# Patient Record
Sex: Female | Born: 1935
Health system: Southern US, Community
[De-identification: ages and names within clinical notes are randomized; demographics above are authoritative.]

## PROBLEM LIST (undated history)

## (undated) DIAGNOSIS — R262 Difficulty in walking, not elsewhere classified: Secondary | ICD-10-CM

## (undated) DIAGNOSIS — I639 Cerebral infarction, unspecified: Secondary | ICD-10-CM

## (undated) DIAGNOSIS — M6281 Muscle weakness (generalized): Secondary | ICD-10-CM

## (undated) DIAGNOSIS — I10 Essential (primary) hypertension: Secondary | ICD-10-CM

## (undated) DIAGNOSIS — F0390 Unspecified dementia without behavioral disturbance: Secondary | ICD-10-CM

## (undated) HISTORY — PX: LAPAROSCOPIC CHOLECYSTECTOMY: SUR755

## (undated) HISTORY — PX: CATARACT EXTRACTION, BILATERAL: SHX1313

---

## 1999-09-20 ENCOUNTER — Ambulatory Visit (HOSPITAL_COMMUNITY): Admission: RE | Admit: 1999-09-20 | Discharge: 1999-09-20 | Payer: Self-pay | Admitting: Specialist

## 2015-10-26 DIAGNOSIS — Z23 Encounter for immunization: Secondary | ICD-10-CM | POA: Diagnosis not present

## 2016-08-15 DIAGNOSIS — S20212A Contusion of left front wall of thorax, initial encounter: Secondary | ICD-10-CM | POA: Diagnosis not present

## 2016-08-15 DIAGNOSIS — R079 Chest pain, unspecified: Secondary | ICD-10-CM | POA: Diagnosis not present

## 2016-08-15 DIAGNOSIS — S79911A Unspecified injury of right hip, initial encounter: Secondary | ICD-10-CM | POA: Diagnosis not present

## 2016-08-15 DIAGNOSIS — G8911 Acute pain due to trauma: Secondary | ICD-10-CM | POA: Diagnosis not present

## 2016-08-15 DIAGNOSIS — S20219A Contusion of unspecified front wall of thorax, initial encounter: Secondary | ICD-10-CM | POA: Diagnosis not present

## 2016-08-15 DIAGNOSIS — S299XXA Unspecified injury of thorax, initial encounter: Secondary | ICD-10-CM | POA: Diagnosis not present

## 2016-08-21 DIAGNOSIS — Z6826 Body mass index (BMI) 26.0-26.9, adult: Secondary | ICD-10-CM | POA: Diagnosis not present

## 2016-08-21 DIAGNOSIS — I10 Essential (primary) hypertension: Secondary | ICD-10-CM | POA: Diagnosis not present

## 2016-08-21 DIAGNOSIS — S20212A Contusion of left front wall of thorax, initial encounter: Secondary | ICD-10-CM | POA: Diagnosis not present

## 2017-08-05 DIAGNOSIS — S199XXA Unspecified injury of neck, initial encounter: Secondary | ICD-10-CM | POA: Diagnosis not present

## 2017-08-05 DIAGNOSIS — R609 Edema, unspecified: Secondary | ICD-10-CM | POA: Diagnosis not present

## 2017-08-05 DIAGNOSIS — R51 Headache: Secondary | ICD-10-CM | POA: Diagnosis not present

## 2017-08-05 DIAGNOSIS — T07XXXA Unspecified multiple injuries, initial encounter: Secondary | ICD-10-CM | POA: Diagnosis not present

## 2017-08-05 DIAGNOSIS — S0990XA Unspecified injury of head, initial encounter: Secondary | ICD-10-CM | POA: Diagnosis not present

## 2017-08-05 DIAGNOSIS — S52591A Other fractures of lower end of right radius, initial encounter for closed fracture: Secondary | ICD-10-CM | POA: Diagnosis not present

## 2017-08-05 DIAGNOSIS — S52124A Nondisplaced fracture of head of right radius, initial encounter for closed fracture: Secondary | ICD-10-CM | POA: Diagnosis not present

## 2017-08-05 DIAGNOSIS — R220 Localized swelling, mass and lump, head: Secondary | ICD-10-CM | POA: Diagnosis not present

## 2017-08-05 DIAGNOSIS — S0001XA Abrasion of scalp, initial encounter: Secondary | ICD-10-CM | POA: Diagnosis not present

## 2017-08-05 DIAGNOSIS — S62101A Fracture of unspecified carpal bone, right wrist, initial encounter for closed fracture: Secondary | ICD-10-CM | POA: Diagnosis not present

## 2017-08-05 DIAGNOSIS — S0081XA Abrasion of other part of head, initial encounter: Secondary | ICD-10-CM | POA: Diagnosis not present

## 2017-12-07 DIAGNOSIS — S0990XA Unspecified injury of head, initial encounter: Secondary | ICD-10-CM | POA: Diagnosis not present

## 2017-12-07 DIAGNOSIS — F0390 Unspecified dementia without behavioral disturbance: Secondary | ICD-10-CM | POA: Diagnosis not present

## 2017-12-07 DIAGNOSIS — R404 Transient alteration of awareness: Secondary | ICD-10-CM | POA: Diagnosis not present

## 2017-12-07 DIAGNOSIS — I10 Essential (primary) hypertension: Secondary | ICD-10-CM | POA: Diagnosis not present

## 2017-12-07 DIAGNOSIS — S52591D Other fractures of lower end of right radius, subsequent encounter for closed fracture with routine healing: Secondary | ICD-10-CM | POA: Diagnosis not present

## 2017-12-07 DIAGNOSIS — R410 Disorientation, unspecified: Secondary | ICD-10-CM | POA: Diagnosis not present

## 2017-12-07 DIAGNOSIS — I499 Cardiac arrhythmia, unspecified: Secondary | ICD-10-CM | POA: Diagnosis not present

## 2017-12-07 DIAGNOSIS — R079 Chest pain, unspecified: Secondary | ICD-10-CM | POA: Diagnosis not present

## 2017-12-07 DIAGNOSIS — R4182 Altered mental status, unspecified: Secondary | ICD-10-CM | POA: Diagnosis not present

## 2017-12-11 DIAGNOSIS — R262 Difficulty in walking, not elsewhere classified: Secondary | ICD-10-CM | POA: Diagnosis not present

## 2017-12-11 DIAGNOSIS — R634 Abnormal weight loss: Secondary | ICD-10-CM | POA: Diagnosis not present

## 2017-12-11 DIAGNOSIS — F0390 Unspecified dementia without behavioral disturbance: Secondary | ICD-10-CM | POA: Diagnosis not present

## 2017-12-11 DIAGNOSIS — I10 Essential (primary) hypertension: Secondary | ICD-10-CM | POA: Diagnosis not present

## 2017-12-11 DIAGNOSIS — Z1331 Encounter for screening for depression: Secondary | ICD-10-CM | POA: Diagnosis not present

## 2017-12-11 DIAGNOSIS — Z9181 History of falling: Secondary | ICD-10-CM | POA: Diagnosis not present

## 2017-12-31 DIAGNOSIS — I10 Essential (primary) hypertension: Secondary | ICD-10-CM | POA: Diagnosis not present

## 2017-12-31 DIAGNOSIS — F0390 Unspecified dementia without behavioral disturbance: Secondary | ICD-10-CM | POA: Diagnosis not present

## 2017-12-31 DIAGNOSIS — Z9181 History of falling: Secondary | ICD-10-CM | POA: Diagnosis not present

## 2017-12-31 DIAGNOSIS — Z7982 Long term (current) use of aspirin: Secondary | ICD-10-CM | POA: Diagnosis not present

## 2018-01-01 DIAGNOSIS — Z7982 Long term (current) use of aspirin: Secondary | ICD-10-CM | POA: Diagnosis not present

## 2018-01-01 DIAGNOSIS — Z9181 History of falling: Secondary | ICD-10-CM | POA: Diagnosis not present

## 2018-01-01 DIAGNOSIS — I10 Essential (primary) hypertension: Secondary | ICD-10-CM | POA: Diagnosis not present

## 2018-01-01 DIAGNOSIS — F0390 Unspecified dementia without behavioral disturbance: Secondary | ICD-10-CM | POA: Diagnosis not present

## 2018-01-05 DIAGNOSIS — Z7982 Long term (current) use of aspirin: Secondary | ICD-10-CM | POA: Diagnosis not present

## 2018-01-05 DIAGNOSIS — I10 Essential (primary) hypertension: Secondary | ICD-10-CM | POA: Diagnosis not present

## 2018-01-05 DIAGNOSIS — F0390 Unspecified dementia without behavioral disturbance: Secondary | ICD-10-CM | POA: Diagnosis not present

## 2018-01-05 DIAGNOSIS — Z9181 History of falling: Secondary | ICD-10-CM | POA: Diagnosis not present

## 2018-01-22 DIAGNOSIS — I10 Essential (primary) hypertension: Secondary | ICD-10-CM | POA: Diagnosis not present

## 2018-01-22 DIAGNOSIS — R262 Difficulty in walking, not elsewhere classified: Secondary | ICD-10-CM | POA: Diagnosis not present

## 2018-01-22 DIAGNOSIS — F0390 Unspecified dementia without behavioral disturbance: Secondary | ICD-10-CM | POA: Diagnosis not present

## 2018-01-22 DIAGNOSIS — E663 Overweight: Secondary | ICD-10-CM | POA: Diagnosis not present

## 2018-01-22 DIAGNOSIS — R634 Abnormal weight loss: Secondary | ICD-10-CM | POA: Diagnosis not present

## 2018-02-03 DIAGNOSIS — I10 Essential (primary) hypertension: Secondary | ICD-10-CM | POA: Diagnosis not present

## 2018-02-03 DIAGNOSIS — F0280 Dementia in other diseases classified elsewhere without behavioral disturbance: Secondary | ICD-10-CM | POA: Diagnosis not present

## 2018-02-03 DIAGNOSIS — G309 Alzheimer's disease, unspecified: Secondary | ICD-10-CM | POA: Diagnosis not present

## 2018-02-03 DIAGNOSIS — R634 Abnormal weight loss: Secondary | ICD-10-CM | POA: Diagnosis not present

## 2018-02-08 ENCOUNTER — Emergency Department (HOSPITAL_COMMUNITY): Payer: Medicare Other

## 2018-02-08 ENCOUNTER — Inpatient Hospital Stay (HOSPITAL_COMMUNITY)
Admission: EM | Admit: 2018-02-08 | Discharge: 2018-02-16 | DRG: 116 | Disposition: A | Discharge status: Skilled Nursing Facility | Payer: Medicare Other | Attending: General Surgery | Admitting: General Surgery

## 2018-02-08 ENCOUNTER — Encounter (HOSPITAL_COMMUNITY): Payer: Self-pay | Admitting: Emergency Medicine

## 2018-02-08 ENCOUNTER — Inpatient Hospital Stay (HOSPITAL_COMMUNITY): Payer: Medicare Other

## 2018-02-08 DIAGNOSIS — S0592XA Unspecified injury of left eye and orbit, initial encounter: Secondary | ICD-10-CM | POA: Diagnosis not present

## 2018-02-08 DIAGNOSIS — R451 Restlessness and agitation: Secondary | ICD-10-CM | POA: Diagnosis present

## 2018-02-08 DIAGNOSIS — R609 Edema, unspecified: Secondary | ICD-10-CM

## 2018-02-08 DIAGNOSIS — S066X0A Traumatic subarachnoid hemorrhage without loss of consciousness, initial encounter: Secondary | ICD-10-CM | POA: Diagnosis present

## 2018-02-08 DIAGNOSIS — S069X9A Unspecified intracranial injury with loss of consciousness of unspecified duration, initial encounter: Secondary | ICD-10-CM | POA: Diagnosis present

## 2018-02-08 DIAGNOSIS — R269 Unspecified abnormalities of gait and mobility: Secondary | ICD-10-CM | POA: Diagnosis not present

## 2018-02-08 DIAGNOSIS — R0902 Hypoxemia: Secondary | ICD-10-CM | POA: Diagnosis not present

## 2018-02-08 DIAGNOSIS — S069XAA Unspecified intracranial injury with loss of consciousness status unknown, initial encounter: Secondary | ICD-10-CM | POA: Diagnosis present

## 2018-02-08 DIAGNOSIS — R279 Unspecified lack of coordination: Secondary | ICD-10-CM | POA: Diagnosis not present

## 2018-02-08 DIAGNOSIS — H5712 Ocular pain, left eye: Secondary | ICD-10-CM | POA: Diagnosis not present

## 2018-02-08 DIAGNOSIS — R600 Localized edema: Secondary | ICD-10-CM | POA: Diagnosis present

## 2018-02-08 DIAGNOSIS — M6281 Muscle weakness (generalized): Secondary | ICD-10-CM | POA: Diagnosis not present

## 2018-02-08 DIAGNOSIS — S0532XA Ocular laceration without prolapse or loss of intraocular tissue, left eye, initial encounter: Secondary | ICD-10-CM | POA: Diagnosis present

## 2018-02-08 DIAGNOSIS — S06899A Other specified intracranial injury with loss of consciousness of unspecified duration, initial encounter: Secondary | ICD-10-CM | POA: Diagnosis not present

## 2018-02-08 DIAGNOSIS — F0390 Unspecified dementia without behavioral disturbance: Secondary | ICD-10-CM

## 2018-02-08 DIAGNOSIS — H6121 Impacted cerumen, right ear: Secondary | ICD-10-CM | POA: Diagnosis present

## 2018-02-08 DIAGNOSIS — Z9181 History of falling: Secondary | ICD-10-CM

## 2018-02-08 DIAGNOSIS — W19XXXA Unspecified fall, initial encounter: Secondary | ICD-10-CM | POA: Diagnosis not present

## 2018-02-08 DIAGNOSIS — S60031A Contusion of right middle finger without damage to nail, initial encounter: Secondary | ICD-10-CM | POA: Diagnosis present

## 2018-02-08 DIAGNOSIS — H4312 Vitreous hemorrhage, left eye: Secondary | ICD-10-CM | POA: Diagnosis present

## 2018-02-08 DIAGNOSIS — H2102 Hyphema, left eye: Secondary | ICD-10-CM | POA: Diagnosis present

## 2018-02-08 DIAGNOSIS — I10 Essential (primary) hypertension: Secondary | ICD-10-CM | POA: Diagnosis present

## 2018-02-08 DIAGNOSIS — M79644 Pain in right finger(s): Secondary | ICD-10-CM | POA: Diagnosis not present

## 2018-02-08 DIAGNOSIS — M2550 Pain in unspecified joint: Secondary | ICD-10-CM | POA: Diagnosis not present

## 2018-02-08 DIAGNOSIS — R531 Weakness: Secondary | ICD-10-CM | POA: Diagnosis not present

## 2018-02-08 DIAGNOSIS — R41841 Cognitive communication deficit: Secondary | ICD-10-CM | POA: Diagnosis not present

## 2018-02-08 DIAGNOSIS — M7989 Other specified soft tissue disorders: Secondary | ICD-10-CM | POA: Diagnosis not present

## 2018-02-08 DIAGNOSIS — S0522XD Ocular laceration and rupture with prolapse or loss of intraocular tissue, left eye, subsequent encounter: Secondary | ICD-10-CM | POA: Diagnosis not present

## 2018-02-08 DIAGNOSIS — H547 Unspecified visual loss: Secondary | ICD-10-CM | POA: Diagnosis present

## 2018-02-08 DIAGNOSIS — Y92099 Unspecified place in other non-institutional residence as the place of occurrence of the external cause: Secondary | ICD-10-CM | POA: Diagnosis not present

## 2018-02-08 DIAGNOSIS — Z87820 Personal history of traumatic brain injury: Secondary | ICD-10-CM | POA: Diagnosis not present

## 2018-02-08 DIAGNOSIS — S0512XA Contusion of eyeball and orbital tissues, left eye, initial encounter: Secondary | ICD-10-CM | POA: Diagnosis not present

## 2018-02-08 DIAGNOSIS — D649 Anemia, unspecified: Secondary | ICD-10-CM

## 2018-02-08 DIAGNOSIS — W19XXXD Unspecified fall, subsequent encounter: Secondary | ICD-10-CM | POA: Diagnosis not present

## 2018-02-08 DIAGNOSIS — S0510XA Contusion of eyeball and orbital tissues, unspecified eye, initial encounter: Secondary | ICD-10-CM | POA: Diagnosis not present

## 2018-02-08 DIAGNOSIS — Z7401 Bed confinement status: Secondary | ICD-10-CM | POA: Diagnosis not present

## 2018-02-08 DIAGNOSIS — S199XXA Unspecified injury of neck, initial encounter: Secondary | ICD-10-CM | POA: Diagnosis not present

## 2018-02-08 DIAGNOSIS — S06330A Contusion and laceration of cerebrum, unspecified, without loss of consciousness, initial encounter: Secondary | ICD-10-CM

## 2018-02-08 DIAGNOSIS — S066X0D Traumatic subarachnoid hemorrhage without loss of consciousness, subsequent encounter: Secondary | ICD-10-CM | POA: Diagnosis not present

## 2018-02-08 DIAGNOSIS — S0081XA Abrasion of other part of head, initial encounter: Secondary | ICD-10-CM | POA: Diagnosis present

## 2018-02-08 DIAGNOSIS — M19041 Primary osteoarthritis, right hand: Secondary | ICD-10-CM | POA: Diagnosis not present

## 2018-02-08 DIAGNOSIS — H44822 Luxation of globe, left eye: Secondary | ICD-10-CM | POA: Diagnosis not present

## 2018-02-08 DIAGNOSIS — S0522XA Ocular laceration and rupture with prolapse or loss of intraocular tissue, left eye, initial encounter: Secondary | ICD-10-CM | POA: Diagnosis not present

## 2018-02-08 DIAGNOSIS — R410 Disorientation, unspecified: Secondary | ICD-10-CM | POA: Diagnosis not present

## 2018-02-08 DIAGNOSIS — R2681 Unsteadiness on feet: Secondary | ICD-10-CM | POA: Diagnosis not present

## 2018-02-08 DIAGNOSIS — S0993XD Unspecified injury of face, subsequent encounter: Secondary | ICD-10-CM | POA: Diagnosis not present

## 2018-02-08 HISTORY — DX: Unspecified dementia, unspecified severity, without behavioral disturbance, psychotic disturbance, mood disturbance, and anxiety: F03.90

## 2018-02-08 HISTORY — DX: Difficulty in walking, not elsewhere classified: R26.2

## 2018-02-08 HISTORY — DX: Essential (primary) hypertension: I10

## 2018-02-08 LAB — CBC WITH DIFFERENTIAL/PLATELET
Abs Immature Granulocytes: 0.03 10*3/uL (ref 0.00–0.07)
Basophils Absolute: 0 10*3/uL (ref 0.0–0.1)
Basophils Relative: 0 %
EOS ABS: 0.2 10*3/uL (ref 0.0–0.5)
Eosinophils Relative: 2 %
HCT: 32 % — ABNORMAL LOW (ref 36.0–46.0)
Hemoglobin: 9.8 g/dL — ABNORMAL LOW (ref 12.0–15.0)
Immature Granulocytes: 0 %
Lymphocytes Relative: 22 %
Lymphs Abs: 1.6 10*3/uL (ref 0.7–4.0)
MCH: 28.4 pg (ref 26.0–34.0)
MCHC: 30.6 g/dL (ref 30.0–36.0)
MCV: 92.8 fL (ref 80.0–100.0)
Monocytes Absolute: 0.5 10*3/uL (ref 0.1–1.0)
Monocytes Relative: 7 %
Neutro Abs: 4.7 10*3/uL (ref 1.7–7.7)
Neutrophils Relative %: 69 %
Platelets: 350 10*3/uL (ref 150–400)
RBC: 3.45 MIL/uL — ABNORMAL LOW (ref 3.87–5.11)
RDW: 13.1 % (ref 11.5–15.5)
WBC: 7 10*3/uL (ref 4.0–10.5)
nRBC: 0 % (ref 0.0–0.2)

## 2018-02-08 LAB — BASIC METABOLIC PANEL
Anion gap: 9 (ref 5–15)
BUN: 18 mg/dL (ref 8–23)
CALCIUM: 9.2 mg/dL (ref 8.9–10.3)
CO2: 25 mmol/L (ref 22–32)
Chloride: 104 mmol/L (ref 98–111)
Creatinine, Ser: 0.91 mg/dL (ref 0.44–1.00)
GFR calc Af Amer: >60 mL/min (ref >60)
GFR calc non Af Amer: 56 mL/min — ABNORMAL LOW (ref >60)
Glucose, Bld: 111 mg/dL — ABNORMAL HIGH (ref 70–99)
Potassium: 3.5 mmol/L (ref 3.5–5.1)
Sodium: 138 mmol/L (ref 135–145)

## 2018-02-08 LAB — MRSA PCR SCREENING: MRSA by PCR: NEGATIVE

## 2018-02-08 LAB — CK: Total CK: 307 U/L — ABNORMAL HIGH (ref 38–234)

## 2018-02-08 MED ORDER — TRAMADOL HCL 50 MG PO TABS: 50.0000 mg | ORAL_TABLET | Freq: Four times a day (QID) | ORAL | Status: DC | PRN

## 2018-02-08 MED ORDER — SODIUM CHLORIDE 0.9% FLUSH: 10.0000 mL | INTRAVENOUS | Status: DC | PRN

## 2018-02-08 MED ORDER — HYDRALAZINE HCL 20 MG/ML IJ SOLN: 10.0000 mg | INTRAMUSCULAR | Status: DC | PRN

## 2018-02-08 MED ORDER — DOCUSATE SODIUM 100 MG PO CAPS: 100.0000 mg | ORAL_CAPSULE | Freq: Two times a day (BID) | ORAL | Status: DC

## 2018-02-08 MED ORDER — LIDOCAINE HCL (PF) 1 % IJ SOLN
5.0000 mL | Freq: Once | INTRAMUSCULAR | Status: DC
  Filled 2018-02-08: qty 5

## 2018-02-08 MED ORDER — ERYTHROMYCIN 5 MG/GM OP OINT
TOPICAL_OINTMENT | Freq: Three times a day (TID) | OPHTHALMIC | Status: DC
  Administered 2018-02-08 – 2018-02-09 (×4): 1 via OPHTHALMIC
  Filled 2018-02-08: qty 3.5

## 2018-02-08 MED ORDER — ONDANSETRON 4 MG PO TBDP: 4.0000 mg | ORAL_TABLET | Freq: Four times a day (QID) | ORAL | Status: DC | PRN

## 2018-02-08 MED ORDER — MORPHINE SULFATE (PF) 2 MG/ML IV SOLN: 1.0000 mg | INTRAVENOUS | Status: DC | PRN

## 2018-02-08 MED ORDER — ACETAMINOPHEN 325 MG PO TABS: 650.0000 mg | ORAL_TABLET | ORAL | Status: DC | PRN

## 2018-02-08 MED ORDER — SODIUM CHLORIDE 0.9% FLUSH
10.0000 mL | Freq: Two times a day (BID) | INTRAVENOUS | Status: DC
  Administered 2018-02-08 – 2018-02-09 (×2): 10 mL

## 2018-02-08 MED ORDER — TETRACAINE HCL 0.5 % OP SOLN
1.0000 [drp] | Freq: Once | OPHTHALMIC | Status: AC
  Administered 2018-02-08: 1 [drp] via OPHTHALMIC
  Filled 2018-02-08: qty 4

## 2018-02-08 MED ORDER — KCL IN DEXTROSE-NACL 20-5-0.45 MEQ/L-%-% IV SOLN
INTRAVENOUS | Status: DC
  Administered 2018-02-08 – 2018-02-09 (×3): via INTRAVENOUS
  Filled 2018-02-08 (×2): qty 1000

## 2018-02-08 MED ORDER — ONDANSETRON HCL 4 MG/2ML IJ SOLN: 4.0000 mg | Freq: Four times a day (QID) | INTRAMUSCULAR | Status: DC | PRN

## 2018-02-08 NOTE — ED Notes (Signed)
Pt to CT via stretcher

## 2018-02-08 NOTE — H&P (Addendum)
Trauma Service Admission Note  [facility] Jun 27, 1926  [id number].    Chief Complaint/Reason for Consult: Fall  HPI:  85yo female with history of dementia who recently relocated from home to [facility] on 01/26/2018. Of note, patient's husband recently passed away this month. Patient was found on the floor by staff this morning and EMS was called and transported to [facility]. Patient noted to have facial trauma. Maxillofacial CT showing left orbital trauma with left globe rupture. CT head showed small hemorrhagic contusions in the left frontal lobe and scattered small volume subarachnoid hemorrhage. CT showed no evidence of acute maxillofacial or cervical spine fractures. Patient remains hemodynamically stable in ED resuscitation. Trauma service called for admission.   ROS:  Review of Systems  Unable to perform ROS: Dementia  Eyes: Positive for pain (left eye).  Cardiovascular: Positive for leg swelling (bilateral, new).  Psychiatric/Behavioral: Positive for memory loss.  Patient's neighbor at bedside provided some ROS information.   History reviewed. No pertinent family history.  Past Medical History:  Diagnosis Date  . Ambulatory dysfunction   . Hypertension   . Senile dementia [facility])     Past Surgical History:  Procedure Laterality Date  . CATARACT EXTRACTION, BILATERAL    . LAPAROSCOPIC CHOLECYSTECTOMY      Social History:  reports that she has never smoked. She has never used smokeless tobacco. She reports previous alcohol use. She reports that she does not use drugs.  Allergies: No Known Allergies  (Not in a hospital admission)   Blood pressure (!) 156/77, pulse 95, temperature 99 F (37.2 C), temperature source Tympanic, resp. rate 18, SpO2 100 %.  Physical Exam Constitutional:      General: She is not in acute distress.    Comments: Pleasantly confused/demented  HENT:     Head:     Comments: Abrasions to forehead and left face. Swelling to left  face.    Right Ear: There is impacted cerumen.     Ears:     Comments: Clear no hemotympanum.    Nose: Nose normal.     Comments: No CSF leak    Mouth/Throat:     Mouth: Mucous membranes are dry.     Pharynx: Oropharynx is clear.  Eyes:     Comments: Right eye pupil is reactive to light. Left eye pupil difficult to visualize d/t inability to elevate eyelid secondary to edema/pain. Muscle protruding from inferior left eyelid.   Neck:     Musculoskeletal: Neck supple. No muscular tenderness.     Comments: Will move neck side to side with no pain, but doesn't understand command to flex extend neck Cardiovascular:     Rate and Rhythm: Normal rate.     Pulses: Normal pulses.     Heart sounds: Normal heart sounds. No murmur. No gallop.   Pulmonary:     Effort: Pulmonary effort is normal. No respiratory distress.     Breath sounds: Normal breath sounds. No wheezing, rhonchi or rales.  Chest:     Chest wall: No tenderness.  Abdominal:     General: Bowel sounds are normal. There is no distension.     Palpations: Abdomen is soft.     Tenderness: There is no abdominal tenderness.  Musculoskeletal:        General: Swelling (bilateral LE, R>L) and signs of injury (swelling/bruising to right 3rd finger) present.     Comments: MAE purposefully and to commands.   Skin:    General: Skin is warm and  dry.  Neurological:     General: No focal deficit present.     Mental Status: She is alert. Mental status is at baseline. She is disoriented.     Sensory: No sensory deficit.     Comments: Neurovascularly intact.  Good strength with flexion and extension of her feet.  Normal grip strength with her hands.  Psychiatric:        Mood and Affect: Mood normal.     Comments: Oriented to self. Stated she was in [place].      Results for orders placed or performed during the hospital encounter of 02/08/18 (from the past 48 hour(s))  CK     Status: Abnormal   Collection Time: 02/08/18  6:25 AM   Result Value Ref Range   Total CK 307 (H) 38 - 234 U/L    Comment: Performed at [facility], [address] [place], [place] [address]  Basic metabolic panel     Status: Abnormal   Collection Time: 02/08/18  6:25 AM  Result Value Ref Range   Sodium 138 135 - 145 mmol/L   Potassium 3.5 3.5 - 5.1 mmol/L   Chloride 104 98 - 111 mmol/L   CO2 25 22 - 32 mmol/L   Glucose, Bld 111 (H) 70 - 99 mg/dL   BUN 18 8 - 23 mg/dL   Creatinine, Ser 9.14 0.44 - 1.00 mg/dL   Calcium 9.2 8.9 - 78.2 mg/dL   GFR calc non Af Amer 56 (L) >60 mL/min   GFR calc Af Amer >60 >60 mL/min   Anion gap 9 5 - 15    Comment: Performed at [facility], [address] [place], [place] [address]  CBC with Differential     Status: Abnormal   Collection Time: 02/08/18  6:25 AM  Result Value Ref Range   WBC 7.0 4.0 - 10.5 K/uL   RBC 3.45 (L) 3.87 - 5.11 MIL/uL   Hemoglobin 9.8 (L) 12.0 - 15.0 g/dL   HCT 30.8 (L) 65.7 - 84.6 %   MCV 92.8 80.0 - 100.0 fL   MCH 28.4 26.0 - 34.0 pg   MCHC 30.6 30.0 - 36.0 g/dL   RDW 96.2 95.2 - 84.1 %   Platelets 350 150 - 400 K/uL   nRBC 0.0 0.0 - 0.2 %   Neutrophils Relative % 69 %   Neutro Abs 4.7 1.7 - 7.7 K/uL   Lymphocytes Relative 22 %   Lymphs Abs 1.6 0.7 - 4.0 K/uL   Monocytes Relative 7 %   Monocytes Absolute 0.5 0.1 - 1.0 K/uL   Eosinophils Relative 2 %   Eosinophils Absolute 0.2 0.0 - 0.5 K/uL   Basophils Relative 0 %   Basophils Absolute 0.0 0.0 - 0.1 K/uL   Immature Granulocytes 0 %   Abs Immature Granulocytes 0.03 0.00 - 0.07 K/uL    Comment: Performed at [facility], [address] [place], [place] [address]   Ct Head Wo Contrast  Result Date: 02/08/2018 CLINICAL DATA:  Found on floor. Facial trauma. Initial encounter. EXAM: CT HEAD WITHOUT CONTRAST CT MAXILLOFACIAL WITHOUT CONTRAST CT CERVICAL SPINE WITHOUT CONTRAST TECHNIQUE: Multidetector CT imaging of the head, cervical spine, and maxillofacial structures were performed using the  standard protocol without intravenous contrast. Multiplanar CT image reconstructions of the cervical spine and maxillofacial structures were also generated. COMPARISON:  Head CT 12/07/2017. Cervical spine CT 08/05/2017. Maxillofacial CT 02/16/2013. FINDINGS: CT HEAD FINDINGS Brain: A few hemorrhagic contusions  are present anteriorly in the left frontal lobe measuring up to 11 mm. Scattered small volume acute subarachnoid hemorrhage is most notable over the left parietal convexity. No acute infarct, midline shift, or extra-axial fluid collection is identified. Cerebral atrophy is most notable in the parietal and temporal lobes. Cerebral white matter hypodensities are similar to the prior study and nonspecific but compatible with mild chronic small vessel ischemic disease. A chronic lacunar infarct is again noted in the right thalamus. Vascular: Calcified atherosclerosis at the skull base. No hyperdense vessel. Skull: No fracture or suspicious osseous lesion. Other: Frontal scalp and left periorbital swelling. CT MAXILLOFACIAL FINDINGS Osseous: No acute fracture or mandibular dislocation. Orbits: Left orbital vitreous hemorrhage with moderate volume hemorrhage surrounding the left globe including posteriorly in the intraconal space. Poorly defined margins of the left globe with abnormal globe contour and mild globe volume loss. Bilateral cataract extraction. Sinuses: Mild scattered mucosal thickening in the paranasal sinuses. No fluid. Clear mastoid air cells. Soft tissues: Moderate left periorbital hematoma with swelling extending inferiorly and laterally in the left face. CT CERVICAL SPINE FINDINGS Alignment: Unchanged and near anatomic alignment. No evidence of traumatic subluxation. Skull base and vertebrae: No acute fracture or suspicious osseous lesion. Soft tissues and spinal canal: No prevertebral fluid or swelling. No visible canal hematoma. Disc levels: Unchanged advanced disc space narrowing at C5-6 and  C6-7 and moderate narrowing at C3-4. Mild-to-moderate multilevel facet arthrosis. Upper chest: Clear lung apices. Other: Mild calcified atherosclerosis of the carotid arteries. IMPRESSION: 1. Small hemorrhagic contusions in the left frontal lobe and scattered small volume subarachnoid hemorrhage. 2. Left orbital trauma as above including evidence of left globe rupture. 3. No evidence of acute maxillofacial or cervical spine fracture. Critical Value/emergent results were called by telephone at the time of interpretation on 02/08/2018 at 7:42 am to Dr. Arthor CaptainABIGAIL HARRIS , who verbally acknowledged these results. Electronically Signed   By: Sebastian AcheAllen  Grady M.D.   On: 02/08/2018 07:44   Ct Cervical Spine Wo Contrast  Result Date: 02/08/2018 CLINICAL DATA:  Found on floor. Facial trauma. Initial encounter. EXAM: CT HEAD WITHOUT CONTRAST CT MAXILLOFACIAL WITHOUT CONTRAST CT CERVICAL SPINE WITHOUT CONTRAST TECHNIQUE: Multidetector CT imaging of the head, cervical spine, and maxillofacial structures were performed using the standard protocol without intravenous contrast. Multiplanar CT image reconstructions of the cervical spine and maxillofacial structures were also generated. COMPARISON:  Head CT 12/07/2017. Cervical spine CT 08/05/2017. Maxillofacial CT 02/16/2013. FINDINGS: CT HEAD FINDINGS Brain: A few hemorrhagic contusions are present anteriorly in the left frontal lobe measuring up to 11 mm. Scattered small volume acute subarachnoid hemorrhage is most notable over the left parietal convexity. No acute infarct, midline shift, or extra-axial fluid collection is identified. Cerebral atrophy is most notable in the parietal and temporal lobes. Cerebral white matter hypodensities are similar to the prior study and nonspecific but compatible with mild chronic small vessel ischemic disease. A chronic lacunar infarct is again noted in the right thalamus. Vascular: Calcified atherosclerosis at the skull base. No hyperdense  vessel. Skull: No fracture or suspicious osseous lesion. Other: Frontal scalp and left periorbital swelling. CT MAXILLOFACIAL FINDINGS Osseous: No acute fracture or mandibular dislocation. Orbits: Left orbital vitreous hemorrhage with moderate volume hemorrhage surrounding the left globe including posteriorly in the intraconal space. Poorly defined margins of the left globe with abnormal globe contour and mild globe volume loss. Bilateral cataract extraction. Sinuses: Mild scattered mucosal thickening in the paranasal sinuses. No fluid. Clear mastoid air cells. Soft tissues: Moderate  left periorbital hematoma with swelling extending inferiorly and laterally in the left face. CT CERVICAL SPINE FINDINGS Alignment: Unchanged and near anatomic alignment. No evidence of traumatic subluxation. Skull base and vertebrae: No acute fracture or suspicious osseous lesion. Soft tissues and spinal canal: No prevertebral fluid or swelling. No visible canal hematoma. Disc levels: Unchanged advanced disc space narrowing at C5-6 and C6-7 and moderate narrowing at C3-4. Mild-to-moderate multilevel facet arthrosis. Upper chest: Clear lung apices. Other: Mild calcified atherosclerosis of the carotid arteries. IMPRESSION: 1. Small hemorrhagic contusions in the left frontal lobe and scattered small volume subarachnoid hemorrhage. 2. Left orbital trauma as above including evidence of left globe rupture. 3. No evidence of acute maxillofacial or cervical spine fracture. Critical Value/emergent results were called by telephone at the time of interpretation on 02/08/2018 at 7:42 am to Dr. Arthor Captain , who verbally acknowledged these results. Electronically Signed   By: Sebastian Ache M.D.   On: 02/08/2018 07:44   Dg Hand Complete Right  Result Date: 02/08/2018 CLINICAL DATA:  Third digit pain, no known injury, initial encounter EXAM: RIGHT HAND - COMPLETE 3+ VIEW COMPARISON:  12/07/2017 FINDINGS: Mild degenerative changes of the  interphalangeal joints are seen. Mild carpal degenerative changes are noted as well. No acute fracture or dislocation is seen. No gross soft tissue abnormality is noted. IMPRESSION: Mild degenerative change without acute abnormality. Electronically Signed   By: Alcide Clever M.D.   On: 02/08/2018 09:47   Ct Maxillofacial Wo Contrast  Result Date: 02/08/2018 CLINICAL DATA:  Found on floor. Facial trauma. Initial encounter. EXAM: CT HEAD WITHOUT CONTRAST CT MAXILLOFACIAL WITHOUT CONTRAST CT CERVICAL SPINE WITHOUT CONTRAST TECHNIQUE: Multidetector CT imaging of the head, cervical spine, and maxillofacial structures were performed using the standard protocol without intravenous contrast. Multiplanar CT image reconstructions of the cervical spine and maxillofacial structures were also generated. COMPARISON:  Head CT 12/07/2017. Cervical spine CT 08/05/2017. Maxillofacial CT 02/16/2013. FINDINGS: CT HEAD FINDINGS Brain: A few hemorrhagic contusions are present anteriorly in the left frontal lobe measuring up to 11 mm. Scattered small volume acute subarachnoid hemorrhage is most notable over the left parietal convexity. No acute infarct, midline shift, or extra-axial fluid collection is identified. Cerebral atrophy is most notable in the parietal and temporal lobes. Cerebral white matter hypodensities are similar to the prior study and nonspecific but compatible with mild chronic small vessel ischemic disease. A chronic lacunar infarct is again noted in the right thalamus. Vascular: Calcified atherosclerosis at the skull base. No hyperdense vessel. Skull: No fracture or suspicious osseous lesion. Other: Frontal scalp and left periorbital swelling. CT MAXILLOFACIAL FINDINGS Osseous: No acute fracture or mandibular dislocation. Orbits: Left orbital vitreous hemorrhage with moderate volume hemorrhage surrounding the left globe including posteriorly in the intraconal space. Poorly defined margins of the left globe with  abnormal globe contour and mild globe volume loss. Bilateral cataract extraction. Sinuses: Mild scattered mucosal thickening in the paranasal sinuses. No fluid. Clear mastoid air cells. Soft tissues: Moderate left periorbital hematoma with swelling extending inferiorly and laterally in the left face. CT CERVICAL SPINE FINDINGS Alignment: Unchanged and near anatomic alignment. No evidence of traumatic subluxation. Skull base and vertebrae: No acute fracture or suspicious osseous lesion. Soft tissues and spinal canal: No prevertebral fluid or swelling. No visible canal hematoma. Disc levels: Unchanged advanced disc space narrowing at C5-6 and C6-7 and moderate narrowing at C3-4. Mild-to-moderate multilevel facet arthrosis. Upper chest: Clear lung apices. Other: Mild calcified atherosclerosis of the carotid arteries. IMPRESSION: 1.  Small hemorrhagic contusions in the left frontal lobe and scattered small volume subarachnoid hemorrhage. 2. Left orbital trauma as above including evidence of left globe rupture. 3. No evidence of acute maxillofacial or cervical spine fracture. Critical Value/emergent results were called by telephone at the time of interpretation on 02/08/2018 at 7:42 am to Dr. Arthor CaptainABIGAIL HARRIS , who verbally acknowledged these results. Electronically Signed   By: Sebastian AcheAllen  Grady M.D.   On: 02/08/2018 07:44      Assessment/Plan Active Problems:   TBI (traumatic brain injury) (HCC)   Injury of globe of left eye   Contusion of right middle finger   Fall   Dementia (HCC)   Anemia   Bilateral lower extremity edema  Dementia: No medications PTA. Relocated to memory care unit on 01/26/2018.  History of falls: Neighbor reports h/o fall last year with right wrist injury. Hypertension: H/o htn on no medications PTA. SBP 150s. PRN antihypertensives ordered.   TBI: CT head on 02/08/2018 showed small hemorrhagic contusions in the left frontal lobe and scattered small volume subarachnoid hemorrhage, Patient  with h/o dementia at baseline. Alert and oriented to self, no focal neuro deficits. Neurosurgery consulted.  Left globe injury: Maxillofacial CT on 02/08/2018 showed left orbital trauma with left globe rupture. Left eye pain. Pain control regimen with PRN acetaminophen, tramadol and morphine ordered. Ophthalmology consulted.   Right middle finger injury: Edema/bruising to finger. X-ray pending.  Anemia: Hgb 9.8 and Hct 32.0 on arrival. Unknown baseline labs. Monitor with repeat CBC tomorrow.  Bilateral lower extremity edema: BLE swollen, R>L. Neighbor reports this is new since relocation. Bilateral venous duplex ordered to rule out DVT.   FEN: NPO until seen by consultants. IVF ordered.  VTE: SCD's; Hold chemical prophylaxis given TBI.  ID: None currently pending opthalmology consult.  Follow up: Neurosurgery, Opthalmology  Plan: Admit to ICU.    Sammuel CooperJessica Jarvis, NP-S 02/08/2018, 9:57 AM   Barnetta ChapelKelly Lukasz Rogus, [facility] Trauma Service Pager: [phone] Pager [phone]

## 2018-02-08 NOTE — Consult Note (Signed)
Reason for Consult: Cerebral contusion Referring Physician: Trauma surgeon  [facility] Natalie Spencer is an 85 y.o. female.  HPI: The patient is an 85 year old demented white female who by report was in the memory unit and took a fall approximately 0500 this morning.  Much of the details are provided by her neighbor who is the patient's power of attorney.  The patient was worked up with a head CT which demonstrated a small left cerebral contusion and subarachnoid hemorrhage as well as a left orbital injury.  A neurosurgical consultation was requested.  Patient is not on any anticoagulants according to her neighbor and power of attorney.  Presently the patient is alert and pleasant.  She is demented so getting assistant history is difficult.  She seems to be at her baseline mentally.  The best I can tell, she cannot see out of her left eye.  Denies neck pain, back pain, etc.  Past Medical History:  Diagnosis Date  . Ambulatory dysfunction   . Hypertension   . Senile dementia (HCC)     History reviewed. No pertinent surgical history.  No family history on file.  Social History:  reports that she has never smoked. She has never used smokeless tobacco. She reports previous alcohol use. She reports that she does not use drugs.  Allergies: No Known Allergies  Medications:  I have reviewed the patient's current medications. Prior to Admission: (Not in a hospital admission)  Scheduled: . docusate sodium  100 mg Oral BID  . lidocaine (PF)  5 mL Other Once   Continuous: . dextrose 5 % and 0.45 % NaCl with KCl 20 mEq/L     ONG:[id number], hydrALAZINE, morphine injection, ondansetron **OR** ondansetron (ZOFRAN) IV, traMADol Anti-infectives (From admission, onward)   None       Results for orders placed or performed during the hospital encounter of 02/08/18 (from the past 48 hour(s))  CK     Status: Abnormal   Collection Time: 02/08/18  6:25 AM  Result Value Ref Range   Total CK 307 (H) 38 - 234  U/L    Comment: Performed at [facility], [address] [place], [place] [address]  Basic metabolic panel     Status: Abnormal   Collection Time: 02/08/18  6:25 AM  Result Value Ref Range   Sodium 138 135 - 145 mmol/L   Potassium 3.5 3.5 - 5.1 mmol/L   Chloride 104 98 - 111 mmol/L   CO2 25 22 - 32 mmol/L   Glucose, Bld 111 (H) 70 - 99 mg/dL   BUN 18 8 - 23 mg/dL   Creatinine, Ser 3.66 0.44 - 1.00 mg/dL   Calcium 9.2 8.9 - 44.0 mg/dL   GFR calc non Af Amer 56 (L) >60 mL/min   GFR calc Af Amer >60 >60 mL/min   Anion gap 9 5 - 15    Comment: Performed at [facility], [address] [place], [place] [address]  CBC with Differential     Status: Abnormal   Collection Time: 02/08/18  6:25 AM  Result Value Ref Range   WBC 7.0 4.0 - 10.5 K/uL   RBC 3.45 (L) 3.87 - 5.11 MIL/uL   Hemoglobin 9.8 (L) 12.0 - 15.0 g/dL   HCT 59.5 (L) 63.8 - 75.6 %   MCV 92.8 80.0 - 100.0 fL   MCH 28.4 26.0 - 34.0 pg   MCHC 30.6 30.0 - 36.0 g/dL   RDW 43.3 29.5 - 18.8 %   Platelets  350 150 - 400 K/uL   nRBC 0.0 0.0 - 0.2 %   Neutrophils Relative % 69 %   Neutro Abs 4.7 1.7 - 7.7 K/uL   Lymphocytes Relative 22 %   Lymphs Abs 1.6 0.7 - 4.0 K/uL   Monocytes Relative 7 %   Monocytes Absolute 0.5 0.1 - 1.0 K/uL   Eosinophils Relative 2 %   Eosinophils Absolute 0.2 0.0 - 0.5 K/uL   Basophils Relative 0 %   Basophils Absolute 0.0 0.0 - 0.1 K/uL   Immature Granulocytes 0 %   Abs Immature Granulocytes 0.03 0.00 - 0.07 K/uL    Comment: Performed at [facility], [address] [place], [place] [id number]    Ct Head Wo Contrast  Result Date: 02/08/2018 CLINICAL DATA:  Found on floor. Facial trauma. Initial encounter. EXAM: CT HEAD WITHOUT CONTRAST CT MAXILLOFACIAL WITHOUT CONTRAST CT CERVICAL SPINE WITHOUT CONTRAST TECHNIQUE: Multidetector CT imaging of the head, cervical spine, and maxillofacial structures were performed using the standard protocol without intravenous contrast. Multiplanar  CT image reconstructions of the cervical spine and maxillofacial structures were also generated. COMPARISON:  Head CT 12/07/2017. Cervical spine CT 08/05/2017. Maxillofacial CT 02/16/2013. FINDINGS: CT HEAD FINDINGS Brain: A few hemorrhagic contusions are present anteriorly in the left frontal lobe measuring up to 11 mm. Scattered small volume acute subarachnoid hemorrhage is most notable over the left parietal convexity. No acute infarct, midline shift, or extra-axial fluid collection is identified. Cerebral atrophy is most notable in the parietal and temporal lobes. Cerebral white matter hypodensities are similar to the prior study and nonspecific but compatible with mild chronic small vessel ischemic disease. A chronic lacunar infarct is again noted in the right thalamus. Vascular: Calcified atherosclerosis at the skull base. No hyperdense vessel. Skull: No fracture or suspicious osseous lesion. Other: Frontal scalp and left periorbital swelling. CT MAXILLOFACIAL FINDINGS Osseous: No acute fracture or mandibular dislocation. Orbits: Left orbital vitreous hemorrhage with moderate volume hemorrhage surrounding the left globe including posteriorly in the intraconal space. Poorly defined margins of the left globe with abnormal globe contour and mild globe volume loss. Bilateral cataract extraction. Sinuses: Mild scattered mucosal thickening in the paranasal sinuses. No fluid. Clear mastoid air cells. Soft tissues: Moderate left periorbital hematoma with swelling extending inferiorly and laterally in the left face. CT CERVICAL SPINE FINDINGS Alignment: Unchanged and near anatomic alignment. No evidence of traumatic subluxation. Skull base and vertebrae: No acute fracture or suspicious osseous lesion. Soft tissues and spinal canal: No prevertebral fluid or swelling. No visible canal hematoma. Disc levels: Unchanged advanced disc space narrowing at C5-6 and C6-7 and moderate narrowing at C3-4. Mild-to-moderate  multilevel facet arthrosis. Upper chest: Clear lung apices. Other: Mild calcified atherosclerosis of the carotid arteries. IMPRESSION: 1. Small hemorrhagic contusions in the left frontal lobe and scattered small volume subarachnoid hemorrhage. 2. Left orbital trauma as above including evidence of left globe rupture. 3. No evidence of acute maxillofacial or cervical spine fracture. Critical Value/emergent results were called by telephone at the time of interpretation on 02/08/2018 at 7:42 am to Dr. Arthor CaptainABIGAIL HARRIS , who verbally acknowledged these results. Electronically Signed   By: Sebastian AcheAllen  Grady M.D.   On: 02/08/2018 07:44   Ct Cervical Spine Wo Contrast  Result Date: 02/08/2018 CLINICAL DATA:  Found on floor. Facial trauma. Initial encounter. EXAM: CT HEAD WITHOUT CONTRAST CT MAXILLOFACIAL WITHOUT CONTRAST CT CERVICAL SPINE WITHOUT CONTRAST TECHNIQUE: Multidetector CT imaging of the head, cervical spine, and maxillofacial structures were performed  using the standard protocol without intravenous contrast. Multiplanar CT image reconstructions of the cervical spine and maxillofacial structures were also generated. COMPARISON:  Head CT 12/07/2017. Cervical spine CT 08/05/2017. Maxillofacial CT 02/16/2013. FINDINGS: CT HEAD FINDINGS Brain: A few hemorrhagic contusions are present anteriorly in the left frontal lobe measuring up to 11 mm. Scattered small volume acute subarachnoid hemorrhage is most notable over the left parietal convexity. No acute infarct, midline shift, or extra-axial fluid collection is identified. Cerebral atrophy is most notable in the parietal and temporal lobes. Cerebral white matter hypodensities are similar to the prior study and nonspecific but compatible with mild chronic small vessel ischemic disease. A chronic lacunar infarct is again noted in the right thalamus. Vascular: Calcified atherosclerosis at the skull base. No hyperdense vessel. Skull: No fracture or suspicious osseous lesion.  Other: Frontal scalp and left periorbital swelling. CT MAXILLOFACIAL FINDINGS Osseous: No acute fracture or mandibular dislocation. Orbits: Left orbital vitreous hemorrhage with moderate volume hemorrhage surrounding the left globe including posteriorly in the intraconal space. Poorly defined margins of the left globe with abnormal globe contour and mild globe volume loss. Bilateral cataract extraction. Sinuses: Mild scattered mucosal thickening in the paranasal sinuses. No fluid. Clear mastoid air cells. Soft tissues: Moderate left periorbital hematoma with swelling extending inferiorly and laterally in the left face. CT CERVICAL SPINE FINDINGS Alignment: Unchanged and near anatomic alignment. No evidence of traumatic subluxation. Skull base and vertebrae: No acute fracture or suspicious osseous lesion. Soft tissues and spinal canal: No prevertebral fluid or swelling. No visible canal hematoma. Disc levels: Unchanged advanced disc space narrowing at C5-6 and C6-7 and moderate narrowing at C3-4. Mild-to-moderate multilevel facet arthrosis. Upper chest: Clear lung apices. Other: Mild calcified atherosclerosis of the carotid arteries. IMPRESSION: 1. Small hemorrhagic contusions in the left frontal lobe and scattered small volume subarachnoid hemorrhage. 2. Left orbital trauma as above including evidence of left globe rupture. 3. No evidence of acute maxillofacial or cervical spine fracture. Critical Value/emergent results were called by telephone at the time of interpretation on 02/08/2018 at 7:42 am to Dr. Arthor Captain , who verbally acknowledged these results. Electronically Signed   By: Sebastian Ache M.D.   On: 02/08/2018 07:44   Ct Maxillofacial Wo Contrast  Result Date: 02/08/2018 CLINICAL DATA:  Found on floor. Facial trauma. Initial encounter. EXAM: CT HEAD WITHOUT CONTRAST CT MAXILLOFACIAL WITHOUT CONTRAST CT CERVICAL SPINE WITHOUT CONTRAST TECHNIQUE: Multidetector CT imaging of the head, cervical spine,  and maxillofacial structures were performed using the standard protocol without intravenous contrast. Multiplanar CT image reconstructions of the cervical spine and maxillofacial structures were also generated. COMPARISON:  Head CT 12/07/2017. Cervical spine CT 08/05/2017. Maxillofacial CT 02/16/2013. FINDINGS: CT HEAD FINDINGS Brain: A few hemorrhagic contusions are present anteriorly in the left frontal lobe measuring up to 11 mm. Scattered small volume acute subarachnoid hemorrhage is most notable over the left parietal convexity. No acute infarct, midline shift, or extra-axial fluid collection is identified. Cerebral atrophy is most notable in the parietal and temporal lobes. Cerebral white matter hypodensities are similar to the prior study and nonspecific but compatible with mild chronic small vessel ischemic disease. A chronic lacunar infarct is again noted in the right thalamus. Vascular: Calcified atherosclerosis at the skull base. No hyperdense vessel. Skull: No fracture or suspicious osseous lesion. Other: Frontal scalp and left periorbital swelling. CT MAXILLOFACIAL FINDINGS Osseous: No acute fracture or mandibular dislocation. Orbits: Left orbital vitreous hemorrhage with moderate volume hemorrhage surrounding the left globe including posteriorly  in the intraconal space. Poorly defined margins of the left globe with abnormal globe contour and mild globe volume loss. Bilateral cataract extraction. Sinuses: Mild scattered mucosal thickening in the paranasal sinuses. No fluid. Clear mastoid air cells. Soft tissues: Moderate left periorbital hematoma with swelling extending inferiorly and laterally in the left face. CT CERVICAL SPINE FINDINGS Alignment: Unchanged and near anatomic alignment. No evidence of traumatic subluxation. Skull base and vertebrae: No acute fracture or suspicious osseous lesion. Soft tissues and spinal canal: No prevertebral fluid or swelling. No visible canal hematoma. Disc levels:  Unchanged advanced disc space narrowing at C5-6 and C6-7 and moderate narrowing at C3-4. Mild-to-moderate multilevel facet arthrosis. Upper chest: Clear lung apices. Other: Mild calcified atherosclerosis of the carotid arteries. IMPRESSION: 1. Small hemorrhagic contusions in the left frontal lobe and scattered small volume subarachnoid hemorrhage. 2. Left orbital trauma as above including evidence of left globe rupture. 3. No evidence of acute maxillofacial or cervical spine fracture. Critical Value/emergent results were called by telephone at the time of interpretation on 02/08/2018 at 7:42 am to Dr. Arthor Captain , who verbally acknowledged these results. Electronically Signed   By: Sebastian Ache M.D.   On: 02/08/2018 07:44    ROS: As above.  She complains of left orbital pain. Blood pressure (!) 156/77, pulse 95, temperature 99 F (37.2 C), temperature source Tympanic, resp. rate 18, SpO2 100 %. There is no height or weight on file to calculate BMI.  Physical Exam  General: An alert and pleasant 85 year old white female with left orbital swelling, scleral hemorrhage and edema, forehead and periorbital abrasions.  HEENT: The patient's left pupil is irregular and nonreactive.  Extraocular muscles are normal on the right.  She has a decreased extraocular range of motion of the left but is able to move her eye.  I could not see either tympanic membrane because of cerumen but I do not see any evidence of CSF otorrhea or blood.  Neck: Supple without masses or deformities.  She has a age-appropriate normal range of motion.  Thorax: Symmetric  Abdomen: Soft  Extremities: Unremarkable  Back exam: Unremarkable  Neurologic exam: The patient is alert and oriented x1, person.  Glascow coma scale [id number].  She will answer simple questions but sometimes inappropriately.  Evidently this is her baseline.  She is moving all 4 extremities well.  It is difficult to accurately assess her strength as she does not  follow my directions well.  Sensory function is intact to light touch sensation all tested dermatomes bilaterally.  Cranial nerves II through XII are examined as above.  She seems to see fairly well out of her right eye but the best I can tell cannot see at all at her left eye.  Pupils are as above.  Imaging studies: I have reviewed the patient's head CT performed at [facility] today.  She has a small left frontal cerebral contusion with a small amount of subarachnoid hemorrhage.  There is no significant mass-effect.  She has left orbital swelling.  I have also reviewed the patient's cervical CT performed at [facility] today.  She has mild degenerative changes.  I do not see any acute fractures or subluxations, etc. Assessment/Plan: Small cerebral contusion, small traumatic subarachnoid hemorrhage: I have discussed the situation with the patient's PA and her power of attorney.  I do not think it is likely that this contusion will enlarge to the point where she would need surgery particularly since it happened about 5  hours ago and she has not anticoagulated.  I will leave timing of her orbital surgery up to discretion of the anesthesiologist and ophthalmologist.  The patient is going to admitted by the trauma service and I will plan to repeat her CAT scan tomorrow.  Cristi LoronJeffrey D Lorrie Strauch 02/08/2018, 9:45 AM

## 2018-02-08 NOTE — ED Notes (Signed)
Purewick has been placed and hooked up to suction.

## 2018-02-08 NOTE — Evaluation (Signed)
Physical Therapy Evaluation Patient Details Name: Natalie Spencer MRN: [id number] DOB: [birth year] Today's Date: 02/08/2018   History of Present Illness  85 yo admitted after fall at memory care with left frontal intracerebral contusion and left globe rupture. PMhx: dementia, HTN  Clinical Impression  Pt pleasantly confused unaware of place, situation, time. Pt perseverating on desire for food and need to void and unable to recall answers to NPO and having toileted during session. Pt with decreased strength, transfers, and gait who requires 24hr supervision/assist and has no carryover of education or assist required. Pt will benefit from ST-SNF to maximize function and safety to decrease burden of care and fall risk. Son reports he desires to take her home and has 4 stairs to enter his home and can provide 24hr care but son is not currently POA. Will follow acutely with daily mobility with nursing recommended. Pt requires mod cues to not touch her swollen eye.      Follow Up Recommendations SNF;Supervision/Assistance - 24 hour    Equipment Recommendations  None recommended by PT    Recommendations for Other Services       Precautions / Restrictions Precautions Precautions: Fall Restrictions Weight Bearing Restrictions: No      Mobility  Bed Mobility Overal bed mobility: Needs Assistance Bed Mobility: Supine to Sit     Supine to sit: Min assist     General bed mobility comments: assist to bring legs off of bed and elevate trunk  Transfers Overall transfer level: Needs assistance   Transfers: Sit to/from Stand Sit to Stand: Min guard         General transfer comment: cues for hand placement and safety  Ambulation/Gait Ambulation/Gait assistance: Min assist Gait Distance (Feet): 100 Feet Assistive device: Rolling walker (2 wheeled) Gait Pattern/deviations: Step-through pattern;Decreased stride length;Trunk flexed   Gait velocity interpretation: 1.31 - 2.62 ft/sec,  indicative of limited community ambulator General Gait Details: cues for posture, position in RW and safety with assist to direct rW  Stairs            Wheelchair Mobility    Modified Rankin (Stroke Patients Only)       Balance Overall balance assessment: Needs assistance;History of Falls   Sitting balance-Leahy Scale: Good     Standing balance support: Bilateral upper extremity supported Standing balance-Leahy Scale: Poor                               Pertinent Vitals/Pain Pain Assessment: No/denies pain    Home Living Family/patient expects to be discharged to:: Assisted living               Home Equipment: Walker - 2 wheels;Walker - 4 wheels Additional Comments: pt has RW and rollator but refuses to use them per son    Prior Function Level of Independence: Independent;Needs assistance   Gait / Transfers Assistance Needed: independent for gait and transfers  ADL's / Homemaking Assistance Needed: assist for bathing and dressing        Hand Dominance        Extremity/Trunk Assessment   Upper Extremity Assessment Upper Extremity Assessment: Generalized weakness    Lower Extremity Assessment Lower Extremity Assessment: Generalized weakness    Cervical / Trunk Assessment Cervical / Trunk Assessment: Kyphotic  Communication   Communication: No difficulties  Cognition Arousal/Alertness: Awake/alert Behavior During Therapy: Flat affect Overall Cognitive Status: Impaired/Different from baseline Area of Impairment: Orientation;Attention;Memory;Following commands;Problem solving;Safety/judgement  Orientation Level: Place;Time;Situation Current Attention Level: Sustained Memory: Decreased short-term memory Following Commands: Follows one step commands consistently Safety/Judgement: Decreased awareness of deficits;Decreased awareness of safety   Problem Solving: Slow processing General Comments: pt perseverating  on food and need to void despite having voided during session      General Comments      Exercises     Assessment/Plan    PT Assessment Patient needs continued PT services  PT Problem List Decreased strength;Decreased balance;Decreased cognition;Decreased mobility;Decreased knowledge of use of DME;Decreased activity tolerance;Decreased coordination;Decreased safety awareness       PT Treatment Interventions Gait training;Therapeutic activities;Stair training;Therapeutic exercise;Cognitive remediation;DME instruction;Functional mobility training;Balance training;Patient/family education    PT Goals (Current goals can be found in the Care Plan section)  Acute Rehab PT Goals Patient Stated Goal: eat some oatmeal PT Goal Formulation: With patient Time For Goal Achievement: 02/22/18 Potential to Achieve Goals: Fair    Frequency Min 3X/week   Barriers to discharge Decreased caregiver support      Co-evaluation               AM-PAC PT "6 Clicks" Mobility  Outcome Measure Help needed turning from your back to your side while in a flat bed without using bedrails?: A Little Help needed moving from lying on your back to sitting on the side of a flat bed without using bedrails?: A Little Help needed moving to and from a bed to a chair (including a wheelchair)?: A Little Help needed standing up from a chair using your arms (e.g., wheelchair or bedside chair)?: A Little Help needed to walk in hospital room?: A Little Help needed climbing 3-5 steps with a railing? : A Lot 6 Click Score: 17    End of Session Equipment Utilized During Treatment: Gait belt Activity Tolerance: Patient tolerated treatment well Patient left: in chair;with call bell/phone within reach;with chair alarm set Nurse Communication: Mobility status PT Visit Diagnosis: Other abnormalities of gait and mobility (R26.89);Unsteadiness on feet (R26.81);History of falling (Z91.81)    Time: [id number] PT Time  Calculation (min) (ACUTE ONLY): 27 min   Charges:   PT Evaluation $PT Eval Moderate Complexity: 1 Mod PT Treatments $Gait Training: 8-22 mins        Carmin Alvidrez Abner Greenspanabor Nylene Inlow, PT Acute Rehabilitation Services Pager: [phone] Office: [phone]   Kelia Gibbon B Shomari Matusik 02/08/2018, 1:34 PM

## 2018-02-08 NOTE — Progress Notes (Signed)
Eye shield has been placed over patient's left eye. She is very confused and restless so keeping the shield in place will be difficult. Safety sitter order has been placed but there isn't anyone available at this time. Pt has already pulled out her only IV and removed her EKG leads. Bed alarm on sensitive, floor mats in place, Fall risk band and yellow socks are on. Monitoring closely. Zannie Locastro, [place] Scrape, RN

## 2018-02-08 NOTE — NC FL2 (Signed)
[place] MEDICAID FL2 LEVEL OF CARE SCREENING TOOL     IDENTIFICATION  Patient Name: Natalie Spencer Birthdate: 11/23/1926 Sex: female Admission Date (Current Location): 02/08/2018  [facility] and [place] Number:  Producer, television/film/video and Address:  [facility], [address] [place], [place] [address]      Provider Number: [id number]  Attending Physician Name and Address:  Md, Trauma, MD  Relative Name and Phone Number:       Current Level of Care: Hospital Recommended Level of Care: Skilled Nursing Facility Prior Approval Number:    Date Approved/Denied:   PASRR Number: [id number]  Discharge Plan: SNF    Current Diagnoses: Patient Active Problem List   Diagnosis Date Noted  . TBI (traumatic brain injury) (HCC) 02/08/2018  . Injury of globe of left eye 02/08/2018  . Contusion of right middle finger 02/08/2018  . Fall 02/08/2018  . Dementia (HCC) 02/08/2018  . Anemia 02/08/2018  . Bilateral lower extremity edema 02/08/2018    Orientation RESPIRATION BLADDER Height & Weight     Self  Normal Continent Weight:   Height:     BEHAVIORAL SYMPTOMS/MOOD NEUROLOGICAL BOWEL NUTRITION STATUS      Continent Diet  AMBULATORY STATUS COMMUNICATION OF NEEDS Skin   Limited Assist Verbally Skin abrasions, Bruising(Abrasions and bruising to Lt face)                       Personal Care Assistance Level of Assistance  Bathing, Feeding, Dressing Bathing Assistance: Limited assistance Feeding assistance: Limited assistance Dressing Assistance: Limited assistance     Functional Limitations Info  (Lt globe rupture) Sight Info: Impaired        SPECIAL CARE FACTORS FREQUENCY  PT (By licensed PT), OT (By licensed OT)     PT Frequency: 5 times/week OT Frequency: 5 times/week            Contractures Contractures Info: Not present    Additional Factors Info  Code Status, Allergies Code Status Info: Full Allergies Info: No Known allergies           Current Medications (02/08/2018):  This is the current hospital active medication list Current Facility-Administered Medications  Medication Dose Route Frequency Provider Last Rate Last Dose  . acetaminophen (TYLENOL) tablet 650 mg  650 mg Oral Q4H PRN Barnetta Chapel, PA-C      . dextrose 5 % and 0.45 % NaCl with KCl 20 mEq/L infusion   Intravenous Continuous Barnetta Chapel, PA-C 75 mL/hr at 02/08/18 1600    . docusate sodium (COLACE) capsule 100 mg  100 mg Oral BID Barnetta Chapel, PA-C      . erythromycin ophthalmic ointment   Left Eye Q8H Barnetta Chapel, PA-C   1 application at 02/08/18 1522  . hydrALAZINE (APRESOLINE) injection 10 mg  10 mg Intravenous Q2H PRN Barnetta Chapel, PA-C      . lidocaine (PF) (XYLOCAINE) 1 % injection 5 mL  5 mL Other Once Alvira Monday, MD   Stopped at 02/08/18 0730  . morphine 2 MG/ML injection 1-2 mg  1-2 mg Intravenous Q2H PRN Barnetta Chapel, PA-C      . ondansetron (ZOFRAN-ODT) disintegrating tablet 4 mg  4 mg Oral Q6H PRN Barnetta Chapel, PA-C       Or  . ondansetron [facility]) injection 4 mg  4 mg Intravenous Q6H PRN Barnetta Chapel, PA-C      . traMADol Janean Sark) tablet 50 mg  50 mg Oral Q6H PRN  Barnetta Chapelsborne, Kelly, PA-C         Discharge Medications: Please see discharge summary for a list of discharge medications.  Relevant Imaging Results:  Relevant Lab Results:   Additional Information    Breylen Agyeman, Berna SpareJulie M, RN

## 2018-02-08 NOTE — ED Notes (Signed)
Patient transported to X-ray 

## 2018-02-08 NOTE — ED Triage Notes (Signed)
Pt [facility] after being found down on floor by staff. Per EMS staff states pt is new to the facility and has hx of wandering.  Pt noted to have bulging L eye, multiple wounds to head. Not known to take blood thinners. Pt is at baseline mental status.

## 2018-02-08 NOTE — Progress Notes (Signed)
POA paperwork requested from [facility]. She is planning on bringing it in to be copied when she returns from work. Contact information updated in the chart.  Jaimi Belle, Dayton Scrape, RN

## 2018-02-08 NOTE — Progress Notes (Signed)
Bilateral lower extremity venous duplex has been completed.   Preliminary results in CV Proc.   Blanch Media 02/08/2018 10:20 AM

## 2018-02-08 NOTE — ED Provider Notes (Signed)
[facility] EMERGENCY DEPARTMENT Provider Note   CSN: [id number] Arrival date & time: 02/08/18  4401     History   Chief Complaint Chief Complaint  Patient presents with  . Fall    HPI Natalie Spencer is a 85 y.o. female who presents the emergency department after unwitnessed fall at her memory care unit.  There is a level 5 caveat due to dementia patient was found around 5:15 AM.  Apparently she was wandering around the unit all night.  States unknown at what time she fell.  She was found to have significant facial trauma and proptotic left eye.  She is not on any blood thinners or any other medications at this time.   HPI  Past Medical History:  Diagnosis Date  . Ambulatory dysfunction   . Hypertension   . Senile dementia (HCC)     There are no active problems to display for this patient.   History reviewed. No pertinent surgical history.   OB History   No obstetric history on file.      Home Medications    Prior to Admission medications   Not on File    Family History No family history on file.  Social History Social History   Tobacco Use  . Smoking status: Never Smoker  . Smokeless tobacco: Never Used  Substance Use Topics  . Alcohol use: Not Currently  . Drug use: Never     Allergies   Patient has no known allergies.   Review of Systems Review of Systems Ten systems reviewed and are negative for acute change, except as noted in the HPI.    Physical Exam Updated Vital Signs BP (!) 155/86 (BP Location: Right Arm)   Pulse 90   Temp 99 F (37.2 C) (Tympanic)   Resp 20   SpO2 100%   Physical Exam Vitals signs and nursing note reviewed.  Constitutional:      General: She is not in acute distress.    Appearance: She is well-developed. She is not diaphoretic.  HENT:     Head: Normocephalic.   Eyes:     General: No scleral icterus.    Intraocular pressure: Right eye pressure is 13 mmHg. Left eye pressure is 0 mmHg.  Measurements were taken using a handheld tonometer.    Conjunctiva/sclera:     Right eye: Chemosis and hemorrhage present.     Left eye: Chemosis and hemorrhage present.     Pupils:     Left eye: Pupil is not reactive.   Neck:     Musculoskeletal: Normal range of motion.  Cardiovascular:     Rate and Rhythm: Normal rate and regular rhythm.     Heart sounds: Normal heart sounds. No murmur. No friction rub. No gallop.   Pulmonary:     Effort: Pulmonary effort is normal. No respiratory distress.     Breath sounds: Normal breath sounds.  Abdominal:     General: Bowel sounds are normal. There is no distension.     Palpations: Abdomen is soft. There is no mass.     Tenderness: There is no abdominal tenderness. There is no guarding.  Skin:    General: Skin is warm and dry.  Neurological:     Mental Status: She is alert and oriented to person, place, and time.  Psychiatric:        Behavior: Behavior normal.      ED Treatments / Results  Labs (all labs ordered are listed, but only abnormal  results are displayed) Labs Reviewed  CK - Abnormal; Notable for the following components:      Result Value   Total CK 307 (*)    All other components within normal limits  BASIC METABOLIC PANEL - Abnormal; Notable for the following components:   Glucose, Bld 111 (*)    GFR calc non Af Amer 56 (*)    All other components within normal limits  CBC WITH DIFFERENTIAL/PLATELET - Abnormal; Notable for the following components:   RBC 3.45 (*)    Hemoglobin 9.8 (*)    HCT 32.0 (*)    All other components within normal limits    EKG None  Radiology Ct Head Wo Contrast  Result Date: 02/08/2018 CLINICAL DATA:  Found on floor. Facial trauma. Initial encounter. EXAM: CT HEAD WITHOUT CONTRAST CT MAXILLOFACIAL WITHOUT CONTRAST CT CERVICAL SPINE WITHOUT CONTRAST TECHNIQUE: Multidetector CT imaging of the head, cervical spine, and maxillofacial structures were performed using the standard protocol  without intravenous contrast. Multiplanar CT image reconstructions of the cervical spine and maxillofacial structures were also generated. COMPARISON:  Head CT 12/07/2017. Cervical spine CT 08/05/2017. Maxillofacial CT 02/16/2013. FINDINGS: CT HEAD FINDINGS Brain: A few hemorrhagic contusions are present anteriorly in the left frontal lobe measuring up to 11 mm. Scattered small volume acute subarachnoid hemorrhage is most notable over the left parietal convexity. No acute infarct, midline shift, or extra-axial fluid collection is identified. Cerebral atrophy is most notable in the parietal and temporal lobes. Cerebral white matter hypodensities are similar to the prior study and nonspecific but compatible with mild chronic small vessel ischemic disease. A chronic lacunar infarct is again noted in the right thalamus. Vascular: Calcified atherosclerosis at the skull base. No hyperdense vessel. Skull: No fracture or suspicious osseous lesion. Other: Frontal scalp and left periorbital swelling. CT MAXILLOFACIAL FINDINGS Osseous: No acute fracture or mandibular dislocation. Orbits: Left orbital vitreous hemorrhage with moderate volume hemorrhage surrounding the left globe including posteriorly in the intraconal space. Poorly defined margins of the left globe with abnormal globe contour and mild globe volume loss. Bilateral cataract extraction. Sinuses: Mild scattered mucosal thickening in the paranasal sinuses. No fluid. Clear mastoid air cells. Soft tissues: Moderate left periorbital hematoma with swelling extending inferiorly and laterally in the left face. CT CERVICAL SPINE FINDINGS Alignment: Unchanged and near anatomic alignment. No evidence of traumatic subluxation. Skull base and vertebrae: No acute fracture or suspicious osseous lesion. Soft tissues and spinal canal: No prevertebral fluid or swelling. No visible canal hematoma. Disc levels: Unchanged advanced disc space narrowing at C5-6 and C6-7 and moderate  narrowing at C3-4. Mild-to-moderate multilevel facet arthrosis. Upper chest: Clear lung apices. Other: Mild calcified atherosclerosis of the carotid arteries. IMPRESSION: 1. Small hemorrhagic contusions in the left frontal lobe and scattered small volume subarachnoid hemorrhage. 2. Left orbital trauma as above including evidence of left globe rupture. 3. No evidence of acute maxillofacial or cervical spine fracture. Critical Value/emergent results were called by telephone at the time of interpretation on 02/08/2018 at 7:42 am to Dr. Arthor Captain , who verbally acknowledged these results. Electronically Signed   By: Sebastian Ache M.D.   On: 02/08/2018 07:44   Ct Cervical Spine Wo Contrast  Result Date: 02/08/2018 CLINICAL DATA:  Found on floor. Facial trauma. Initial encounter. EXAM: CT HEAD WITHOUT CONTRAST CT MAXILLOFACIAL WITHOUT CONTRAST CT CERVICAL SPINE WITHOUT CONTRAST TECHNIQUE: Multidetector CT imaging of the head, cervical spine, and maxillofacial structures were performed using the standard protocol without intravenous contrast. Multiplanar CT  image reconstructions of the cervical spine and maxillofacial structures were also generated. COMPARISON:  Head CT 12/07/2017. Cervical spine CT 08/05/2017. Maxillofacial CT 02/16/2013. FINDINGS: CT HEAD FINDINGS Brain: A few hemorrhagic contusions are present anteriorly in the left frontal lobe measuring up to 11 mm. Scattered small volume acute subarachnoid hemorrhage is most notable over the left parietal convexity. No acute infarct, midline shift, or extra-axial fluid collection is identified. Cerebral atrophy is most notable in the parietal and temporal lobes. Cerebral white matter hypodensities are similar to the prior study and nonspecific but compatible with mild chronic small vessel ischemic disease. A chronic lacunar infarct is again noted in the right thalamus. Vascular: Calcified atherosclerosis at the skull base. No hyperdense vessel. Skull: No  fracture or suspicious osseous lesion. Other: Frontal scalp and left periorbital swelling. CT MAXILLOFACIAL FINDINGS Osseous: No acute fracture or mandibular dislocation. Orbits: Left orbital vitreous hemorrhage with moderate volume hemorrhage surrounding the left globe including posteriorly in the intraconal space. Poorly defined margins of the left globe with abnormal globe contour and mild globe volume loss. Bilateral cataract extraction. Sinuses: Mild scattered mucosal thickening in the paranasal sinuses. No fluid. Clear mastoid air cells. Soft tissues: Moderate left periorbital hematoma with swelling extending inferiorly and laterally in the left face. CT CERVICAL SPINE FINDINGS Alignment: Unchanged and near anatomic alignment. No evidence of traumatic subluxation. Skull base and vertebrae: No acute fracture or suspicious osseous lesion. Soft tissues and spinal canal: No prevertebral fluid or swelling. No visible canal hematoma. Disc levels: Unchanged advanced disc space narrowing at C5-6 and C6-7 and moderate narrowing at C3-4. Mild-to-moderate multilevel facet arthrosis. Upper chest: Clear lung apices. Other: Mild calcified atherosclerosis of the carotid arteries. IMPRESSION: 1. Small hemorrhagic contusions in the left frontal lobe and scattered small volume subarachnoid hemorrhage. 2. Left orbital trauma as above including evidence of left globe rupture. 3. No evidence of acute maxillofacial or cervical spine fracture. Critical Value/emergent results were called by telephone at the time of interpretation on 02/08/2018 at 7:42 am to Dr. Arthor CaptainABIGAIL Danie Diehl , who verbally acknowledged these results. Electronically Signed   By: Sebastian AcheAllen  Grady M.D.   On: 02/08/2018 07:44   Ct Maxillofacial Wo Contrast  Result Date: 02/08/2018 CLINICAL DATA:  Found on floor. Facial trauma. Initial encounter. EXAM: CT HEAD WITHOUT CONTRAST CT MAXILLOFACIAL WITHOUT CONTRAST CT CERVICAL SPINE WITHOUT CONTRAST TECHNIQUE: Multidetector CT  imaging of the head, cervical spine, and maxillofacial structures were performed using the standard protocol without intravenous contrast. Multiplanar CT image reconstructions of the cervical spine and maxillofacial structures were also generated. COMPARISON:  Head CT 12/07/2017. Cervical spine CT 08/05/2017. Maxillofacial CT 02/16/2013. FINDINGS: CT HEAD FINDINGS Brain: A few hemorrhagic contusions are present anteriorly in the left frontal lobe measuring up to 11 mm. Scattered small volume acute subarachnoid hemorrhage is most notable over the left parietal convexity. No acute infarct, midline shift, or extra-axial fluid collection is identified. Cerebral atrophy is most notable in the parietal and temporal lobes. Cerebral white matter hypodensities are similar to the prior study and nonspecific but compatible with mild chronic small vessel ischemic disease. A chronic lacunar infarct is again noted in the right thalamus. Vascular: Calcified atherosclerosis at the skull base. No hyperdense vessel. Skull: No fracture or suspicious osseous lesion. Other: Frontal scalp and left periorbital swelling. CT MAXILLOFACIAL FINDINGS Osseous: No acute fracture or mandibular dislocation. Orbits: Left orbital vitreous hemorrhage with moderate volume hemorrhage surrounding the left globe including posteriorly in the intraconal space. Poorly defined margins of the  left globe with abnormal globe contour and mild globe volume loss. Bilateral cataract extraction. Sinuses: Mild scattered mucosal thickening in the paranasal sinuses. No fluid. Clear mastoid air cells. Soft tissues: Moderate left periorbital hematoma with swelling extending inferiorly and laterally in the left face. CT CERVICAL SPINE FINDINGS Alignment: Unchanged and near anatomic alignment. No evidence of traumatic subluxation. Skull base and vertebrae: No acute fracture or suspicious osseous lesion. Soft tissues and spinal canal: No prevertebral fluid or swelling. No  visible canal hematoma. Disc levels: Unchanged advanced disc space narrowing at C5-6 and C6-7 and moderate narrowing at C3-4. Mild-to-moderate multilevel facet arthrosis. Upper chest: Clear lung apices. Other: Mild calcified atherosclerosis of the carotid arteries. IMPRESSION: 1. Small hemorrhagic contusions in the left frontal lobe and scattered small volume subarachnoid hemorrhage. 2. Left orbital trauma as above including evidence of left globe rupture. 3. No evidence of acute maxillofacial or cervical spine fracture. Critical Value/emergent results were called by telephone at the time of interpretation on 02/08/2018 at 7:42 am to Dr. Arthor CaptainABIGAIL Mistie Adney , who verbally acknowledged these results. Electronically Signed   By: Sebastian AcheAllen  Grady M.D.   On: 02/08/2018 07:44    Procedures Procedures (including critical care time)  Medications Ordered in ED Medications - No data to display   Initial Impression / Assessment and Plan / ED Course  I have reviewed the triage vital signs and the nursing notes.  Pertinent labs & imaging results that were available during my care of the patient were reviewed by me and considered in my medical decision making (see chart for details).  Clinical Course as of Feb 09 820  Mon Feb 08, 2018  [id number] Spoke with Dr. Allena KatzPatel, Aware of globe rupture.    [AH]  [phone] Spoke with Dr. Mosetta PuttGrady about the ct findings   [AH]  0800 Dr. Janee Mornhompson aware of Trauma for admission.   [AH]    Clinical Course User Index [AH] Arthor CaptainHarris, Davionne Mastrangelo, PA-C    Patient with blunt trauma to the head and eye.  She has a ruptured globe.  Dr. Allena KatzPatel of ophthalmology is aware of the patient.  Patient also noted to have cerebral contusion and subarachnoid hemorrhage after her fall.  Her power of attorney is at the bedside and we have discussed the findings.  Dr. Delma OfficerJeff Jenkins of neurosurgery has seen the patient and she will only need observation.  Dr. Allena KatzPatel Dr. Lovell SheehanJenkins will discuss risks versus benefits of  anesthesia for globe repair.  She has not had any significant mental decline, she is pleasantly demented.  She will be admitted to the trauma service.  Final Clinical Impressions(s) / ED Diagnoses   Final diagnoses:  None    ED Discharge Orders    None       Arthor CaptainHarris, Deshawnda Acrey, PA-C 02/08/18 1658    Jacalyn LefevreHaviland, Julie, MD 02/09/18 [phone]

## 2018-02-09 ENCOUNTER — Inpatient Hospital Stay (HOSPITAL_COMMUNITY): Payer: Medicare Other

## 2018-02-09 ENCOUNTER — Encounter (HOSPITAL_COMMUNITY): Admission: EM | Disposition: A | Discharge status: Skilled Nursing Facility | Payer: Self-pay | Source: Home / Self Care

## 2018-02-09 ENCOUNTER — Encounter (HOSPITAL_COMMUNITY): Payer: Self-pay | Admitting: Anesthesiology

## 2018-02-09 ENCOUNTER — Inpatient Hospital Stay (HOSPITAL_COMMUNITY): Payer: Medicare Other | Admitting: Anesthesiology

## 2018-02-09 DIAGNOSIS — S0522XA Ocular laceration and rupture with prolapse or loss of intraocular tissue, left eye, initial encounter: Secondary | ICD-10-CM | POA: Diagnosis not present

## 2018-02-09 HISTORY — PX: RUPTURED GLOBE EXPLORATION AND REPAIR: SHX2366

## 2018-02-09 LAB — CBC
HCT: 33.3 % — ABNORMAL LOW (ref 36.0–46.0)
Hemoglobin: 10.4 g/dL — ABNORMAL LOW (ref 12.0–15.0)
MCH: 28.1 pg (ref 26.0–34.0)
MCHC: 31.2 g/dL (ref 30.0–36.0)
MCV: 90 fL (ref 80.0–100.0)
Platelets: 399 10*3/uL (ref 150–400)
RBC: 3.7 MIL/uL — ABNORMAL LOW (ref 3.87–5.11)
RDW: 13.1 % (ref 11.5–15.5)
WBC: 6.6 10*3/uL (ref 4.0–10.5)
nRBC: 0 % (ref 0.0–0.2)

## 2018-02-09 LAB — TYPE AND SCREEN
ABO/RH(D): O POS
ANTIBODY SCREEN: NEGATIVE

## 2018-02-09 LAB — ABO/RH: ABO/RH(D): O POS

## 2018-02-09 LAB — BASIC METABOLIC PANEL
Anion gap: 9 (ref 5–15)
BUN: 10 mg/dL (ref 8–23)
CO2: 23 mmol/L (ref 22–32)
Calcium: 9.3 mg/dL (ref 8.9–10.3)
Chloride: 107 mmol/L (ref 98–111)
Creatinine, Ser: 0.72 mg/dL (ref 0.44–1.00)
GFR calc Af Amer: >60 mL/min (ref >60)
GFR calc non Af Amer: >60 mL/min (ref >60)
GLUCOSE: 94 mg/dL (ref 70–99)
Potassium: 3.4 mmol/L — ABNORMAL LOW (ref 3.5–5.1)
Sodium: 139 mmol/L (ref 135–145)

## 2018-02-09 LAB — SURGICAL PCR SCREEN
MRSA, PCR: NEGATIVE
Staphylococcus aureus: NEGATIVE

## 2018-02-09 SURGERY — REPAIR, RUPTURE, GLOBE
Anesthesia: General | Site: Eye | Laterality: Left

## 2018-02-09 MED ORDER — VANCOMYCIN INTRAVITREAL INJECTION 1 MG/0.1 ML
1.0000 mg | INTRAOCULAR | Status: DC
  Filled 2018-02-09: qty 0.1

## 2018-02-09 MED ORDER — LORAZEPAM 2 MG/ML IJ SOLN
0.5000 mg | Freq: Once | INTRAMUSCULAR | Status: AC
  Administered 2018-02-09: 0.5 mg via INTRAVENOUS
  Filled 2018-02-09: qty 1

## 2018-02-09 MED ORDER — ROCURONIUM BROMIDE 50 MG/5ML IV SOSY
PREFILLED_SYRINGE | INTRAVENOUS | Status: AC
  Filled 2018-02-09: qty 5

## 2018-02-09 MED ORDER — LIDOCAINE HCL 2 % IJ SOLN
INTRAMUSCULAR | Status: AC
  Filled 2018-02-09: qty 20

## 2018-02-09 MED ORDER — LIDOCAINE HCL 2 % IJ SOLN
INTRAMUSCULAR | Status: DC | PRN
  Administered 2018-02-09: 18:00:00 via RETROBULBAR

## 2018-02-09 MED ORDER — DEXAMETHASONE SODIUM PHOSPHATE 10 MG/ML IJ SOLN
INTRAMUSCULAR | Status: AC
  Filled 2018-02-09: qty 1

## 2018-02-09 MED ORDER — PHENYLEPHRINE HCL 2.5 % OP SOLN: 1.0000 [drp] | OPHTHALMIC | Status: DC | PRN

## 2018-02-09 MED ORDER — DEXAMETHASONE SODIUM PHOSPHATE 10 MG/ML IJ SOLN
INTRAMUSCULAR | Status: AC
  Filled 2018-02-09: qty 2

## 2018-02-09 MED ORDER — ONDANSETRON HCL 4 MG/2ML IJ SOLN: 4.0000 mg | Freq: Four times a day (QID) | INTRAMUSCULAR | Status: DC | PRN

## 2018-02-09 MED ORDER — VANCOMYCIN INTRAVITREAL INJECTION 1 MG/0.1 ML
INTRAOCULAR | Status: DC | PRN
  Administered 2018-02-09: 1 mg via INTRAVITREAL

## 2018-02-09 MED ORDER — ACETAMINOPHEN 325 MG PO TABS
650.0000 mg | ORAL_TABLET | ORAL | Status: DC | PRN
  Administered 2018-02-11 – 2018-02-13 (×2): 650 mg via ORAL
  Filled 2018-02-09 (×3): qty 2

## 2018-02-09 MED ORDER — ROCURONIUM BROMIDE 50 MG/5ML IV SOSY
PREFILLED_SYRINGE | INTRAVENOUS | Status: DC | PRN
  Administered 2018-02-09: 10 mg via INTRAVENOUS
  Administered 2018-02-09: 40 mg via INTRAVENOUS

## 2018-02-09 MED ORDER — CEFTAZIDIME INTRAVITREAL INJECTION 2.25 MG/0.1 ML
INTRAVITREAL | Status: DC | PRN
  Administered 2018-02-09: 2.25 mg via INTRAVITREAL

## 2018-02-09 MED ORDER — LIDOCAINE 2% (20 MG/ML) 5 ML SYRINGE
INTRAMUSCULAR | Status: DC | PRN
  Administered 2018-02-09: 80 mg via INTRAVENOUS

## 2018-02-09 MED ORDER — GENTAMICIN SULFATE 40 MG/ML IJ SOLN
INTRAMUSCULAR | Status: AC
  Filled 2018-02-09: qty 2

## 2018-02-09 MED ORDER — ONDANSETRON 4 MG PO TBDP: 4.0000 mg | ORAL_TABLET | Freq: Four times a day (QID) | ORAL | Status: DC | PRN

## 2018-02-09 MED ORDER — PHENYLEPHRINE 40 MCG/ML (10ML) SYRINGE FOR IV PUSH (FOR BLOOD PRESSURE SUPPORT)
PREFILLED_SYRINGE | INTRAVENOUS | Status: DC | PRN
  Administered 2018-02-09: 80 ug via INTRAVENOUS

## 2018-02-09 MED ORDER — FENTANYL CITRATE (PF) 100 MCG/2ML IJ SOLN
INTRAMUSCULAR | Status: DC | PRN
  Administered 2018-02-09 (×2): 50 ug via INTRAVENOUS

## 2018-02-09 MED ORDER — OFLOXACIN 0.3 % OP SOLN
1.0000 [drp] | OPHTHALMIC | Status: AC | PRN
  Administered 2018-02-09 (×3): 1 [drp] via OPHTHALMIC

## 2018-02-09 MED ORDER — BSS IO SOLN
INTRAOCULAR | Status: AC
  Filled 2018-02-09: qty 15

## 2018-02-09 MED ORDER — TRAMADOL HCL 50 MG PO TABS: 50.0000 mg | ORAL_TABLET | Freq: Four times a day (QID) | ORAL | Status: DC | PRN

## 2018-02-09 MED ORDER — CEFTAZIDIME INTRAVITREAL INJECTION 2.25 MG/0.1 ML
2.2500 mg | INTRAVITREAL | Status: DC
  Filled 2018-02-09: qty 0.1

## 2018-02-09 MED ORDER — DEXAMETHASONE SODIUM PHOSPHATE 10 MG/ML IJ SOLN
INTRAMUSCULAR | Status: DC | PRN
  Administered 2018-02-09: 10 mg via INTRAVENOUS

## 2018-02-09 MED ORDER — BACITRACIN-POLYMYXIN B 500-10000 UNIT/GM OP OINT
TOPICAL_OINTMENT | OPHTHALMIC | Status: AC
  Filled 2018-02-09: qty 3.5

## 2018-02-09 MED ORDER — BSS IO SOLN
INTRAOCULAR | Status: DC | PRN
  Administered 2018-02-09 (×2): 15 mL via INTRAOCULAR

## 2018-02-09 MED ORDER — ERYTHROMYCIN 5 MG/GM OP OINT
TOPICAL_OINTMENT | Freq: Three times a day (TID) | OPHTHALMIC | Status: DC
  Administered 2018-02-09: 1 via OPHTHALMIC
  Administered 2018-02-10 – 2018-02-15 (×15): via OPHTHALMIC
  Administered 2018-02-16 (×2): 1 via OPHTHALMIC
  Filled 2018-02-09: qty 3.5

## 2018-02-09 MED ORDER — FENTANYL CITRATE (PF) 250 MCG/5ML IJ SOLN
INTRAMUSCULAR | Status: AC
  Filled 2018-02-09: qty 5

## 2018-02-09 MED ORDER — BSS PLUS IO SOLN
INTRAOCULAR | Status: AC
  Filled 2018-02-09: qty 500

## 2018-02-09 MED ORDER — ONDANSETRON HCL 4 MG/2ML IJ SOLN
INTRAMUSCULAR | Status: DC | PRN
  Administered 2018-02-09: 4 mg via INTRAVENOUS

## 2018-02-09 MED ORDER — SUGAMMADEX SODIUM 200 MG/2ML IV SOLN
INTRAVENOUS | Status: DC | PRN
  Administered 2018-02-09: 150 mg via INTRAVENOUS

## 2018-02-09 MED ORDER — ONDANSETRON HCL 4 MG/2ML IJ SOLN
INTRAMUSCULAR | Status: AC
  Filled 2018-02-09: qty 2

## 2018-02-09 MED ORDER — OFLOXACIN 0.3 % OP SOLN
OPHTHALMIC | Status: AC
  Administered 2018-02-09: 1 [drp] via OPHTHALMIC
  Filled 2018-02-09: qty 5

## 2018-02-09 MED ORDER — CYCLOPENTOLATE HCL 1 % OP SOLN: 1.0000 [drp] | OPHTHALMIC | Status: DC | PRN

## 2018-02-09 MED ORDER — CYCLOPENTOLATE HCL 1 % OP SOLN
1.0000 [drp] | OPHTHALMIC | Status: AC | PRN
  Administered 2018-02-09 (×3): 1 [drp] via OPHTHALMIC

## 2018-02-09 MED ORDER — NEOMYCIN-POLYMYXIN-DEXAMETH 3.5-10000-0.1 OP OINT
TOPICAL_OINTMENT | OPHTHALMIC | Status: AC
  Filled 2018-02-09: qty 3.5

## 2018-02-09 MED ORDER — LIDOCAINE 2% (20 MG/ML) 5 ML SYRINGE
INTRAMUSCULAR | Status: AC
  Filled 2018-02-09: qty 5

## 2018-02-09 MED ORDER — HYDRALAZINE HCL 20 MG/ML IJ SOLN: 10.0000 mg | INTRAMUSCULAR | Status: DC | PRN

## 2018-02-09 MED ORDER — DOCUSATE SODIUM 100 MG PO CAPS
100.0000 mg | ORAL_CAPSULE | Freq: Two times a day (BID) | ORAL | Status: DC
  Administered 2018-02-10 – 2018-02-11 (×4): 100 mg via ORAL
  Filled 2018-02-09 (×4): qty 1

## 2018-02-09 MED ORDER — PROPOFOL 10 MG/ML IV BOLUS
INTRAVENOUS | Status: AC
  Filled 2018-02-09: qty 20

## 2018-02-09 MED ORDER — PROPARACAINE HCL 0.5 % OP SOLN
OPHTHALMIC | Status: AC
  Administered 2018-02-09: 1 [drp] via OPHTHALMIC
  Filled 2018-02-09: qty 15

## 2018-02-09 MED ORDER — ONDANSETRON HCL 4 MG/2ML IJ SOLN
INTRAMUSCULAR | Status: AC
  Filled 2018-02-09: qty 4

## 2018-02-09 MED ORDER — EPINEPHRINE PF 1 MG/ML IJ SOLN
INTRAMUSCULAR | Status: AC
  Filled 2018-02-09: qty 1

## 2018-02-09 MED ORDER — PROPOFOL 10 MG/ML IV BOLUS
INTRAVENOUS | Status: DC | PRN
  Administered 2018-02-09: 70 mg via INTRAVENOUS
  Administered 2018-02-09: 30 mg via INTRAVENOUS

## 2018-02-09 MED ORDER — TRAMADOL HCL 50 MG PO TABS: 50.0000 mg | ORAL_TABLET | Freq: Four times a day (QID) | ORAL | Status: DC

## 2018-02-09 MED ORDER — PHENYLEPHRINE HCL 2.5 % OP SOLN
1.0000 [drp] | Freq: Once | OPHTHALMIC | Status: AC
  Administered 2018-02-09: 1 [drp] via OPHTHALMIC

## 2018-02-09 MED ORDER — PROPARACAINE HCL 0.5 % OP SOLN
1.0000 [drp] | OPHTHALMIC | Status: DC | PRN
  Administered 2018-02-09: 1 [drp] via OPHTHALMIC

## 2018-02-09 MED ORDER — CYCLOPENTOLATE HCL 1 % OP SOLN
1.0000 [drp] | Freq: Once | OPHTHALMIC | Status: AC
  Administered 2018-02-09: 1 [drp] via OPHTHALMIC

## 2018-02-09 MED ORDER — MORPHINE SULFATE (PF) 2 MG/ML IV SOLN
1.0000 mg | INTRAVENOUS | Status: DC | PRN
  Administered 2018-02-09: 2 mg via INTRAVENOUS
  Filled 2018-02-09: qty 1

## 2018-02-09 MED ORDER — CYCLOPENTOLATE HCL 1 % OP SOLN
OPHTHALMIC | Status: AC
  Administered 2018-02-09: 1 [drp] via OPHTHALMIC
  Filled 2018-02-09: qty 2

## 2018-02-09 MED ORDER — DEXAMETHASONE SODIUM PHOSPHATE 10 MG/ML IJ SOLN
INTRAMUSCULAR | Status: DC | PRN
  Administered 2018-02-09: 5 mg via INTRAVENOUS

## 2018-02-09 MED ORDER — TETRACAINE HCL 0.5 % OP SOLN
OPHTHALMIC | Status: AC
  Filled 2018-02-09: qty 4

## 2018-02-09 MED ORDER — SODIUM HYALURONATE 10 MG/ML IO SOLN
INTRAOCULAR | Status: DC | PRN
  Administered 2018-02-09: 0.85 mL via INTRAOCULAR

## 2018-02-09 MED ORDER — HYALURONIDASE HUMAN 150 UNIT/ML IJ SOLN
INTRAMUSCULAR | Status: AC
  Filled 2018-02-09: qty 1

## 2018-02-09 MED ORDER — SODIUM HYALURONATE 10 MG/ML IO SOLN
INTRAOCULAR | Status: AC
  Filled 2018-02-09: qty 0.85

## 2018-02-09 MED ORDER — PROPARACAINE HCL 0.5 % OP SOLN
1.0000 [drp] | Freq: Once | OPHTHALMIC | Status: AC
  Administered 2018-02-09: 1 [drp] via OPHTHALMIC

## 2018-02-09 MED ORDER — PROPARACAINE HCL 0.5 % OP SOLN
1.0000 [drp] | OPHTHALMIC | Status: AC | PRN
  Administered 2018-02-09 (×2): 1 [drp] via OPHTHALMIC

## 2018-02-09 MED ORDER — PHENYLEPHRINE HCL 2.5 % OP SOLN
1.0000 [drp] | OPHTHALMIC | Status: AC | PRN
  Administered 2018-02-09 (×3): 1 [drp] via OPHTHALMIC

## 2018-02-09 MED ORDER — SODIUM CHLORIDE 0.9 % IV SOLN
INTRAVENOUS | Status: DC
  Administered 2018-02-09: 16:00:00 via INTRAVENOUS

## 2018-02-09 MED ORDER — BUPIVACAINE HCL (PF) 0.75 % IJ SOLN
INTRAMUSCULAR | Status: AC
  Filled 2018-02-09: qty 30

## 2018-02-09 MED ORDER — KCL IN DEXTROSE-NACL 20-5-0.45 MEQ/L-%-% IV SOLN: INTRAVENOUS | Status: DC

## 2018-02-09 MED ORDER — PHENYLEPHRINE HCL 2.5 % OP SOLN
OPHTHALMIC | Status: AC
  Administered 2018-02-09: 1 [drp] via OPHTHALMIC
  Filled 2018-02-09: qty 2

## 2018-02-09 MED ORDER — LABETALOL HCL 5 MG/ML IV SOLN
INTRAVENOUS | Status: AC
  Filled 2018-02-09: qty 4

## 2018-02-09 MED ORDER — LABETALOL HCL 5 MG/ML IV SOLN
10.0000 mg | INTRAVENOUS | Status: AC | PRN
  Administered 2018-02-09 (×2): 10 mg via INTRAVENOUS

## 2018-02-09 SURGICAL SUPPLY — 46 items
APL SRG 3 HI ABS STRL LF PLS (MISCELLANEOUS)
APL SWBSTK 6 STRL LF DISP (MISCELLANEOUS) ×4
APPLICATOR COTTON TIP 6 STRL (MISCELLANEOUS) IMPLANT
APPLICATOR COTTON TIP 6IN STRL (MISCELLANEOUS) ×12 IMPLANT
APPLICATOR DR MATTHEWS STRL (MISCELLANEOUS) IMPLANT
BAG ISL DRAPE 18X18 STRL (DRAPES)
BAG ISOLATION DRAPE 18X18 (DRAPES) IMPLANT
BLADE EYE CATARACT 19 1.4 BEAV (BLADE) IMPLANT
BLADE MVR KNIFE 20G (BLADE) IMPLANT
CAUTERY EYE LOW TEMP 1300F FIN (OPHTHALMIC RELATED) IMPLANT
CORDS BIPOLAR (ELECTRODE) ×3 IMPLANT
COVER SURGICAL LIGHT HANDLE (MISCELLANEOUS) ×3 IMPLANT
COVER WAND RF STERILE (DRAPES) ×1 IMPLANT
DRAPE ISOLATION BAG 18X18 (DRAPES)
DRAPE OPHTHALMIC 40X48 W POUCH (DRAPES) ×3 IMPLANT
ERASER HMR WETFIELD 23G BP (MISCELLANEOUS) IMPLANT
GLOVE ECLIPSE 7.5 STRL STRAW (GLOVE) ×5 IMPLANT
GLOVE SURG SS PI 6.5 STRL IVOR (GLOVE) ×2 IMPLANT
GOWN STRL REUS W/ TWL LRG LVL3 (GOWN DISPOSABLE) ×1 IMPLANT
GOWN STRL REUS W/TWL LRG LVL3 (GOWN DISPOSABLE) ×6
KIT BASIN OR (CUSTOM PROCEDURE TRAY) ×3 IMPLANT
LENS BIOM SUPER VIEW SET DISP (OPHTHALMIC RELATED) IMPLANT
NDL 18GX1X1/2 (RX/OR ONLY) (NEEDLE) IMPLANT
NDL FILTER BLUNT 18X1 1/2 (NEEDLE) IMPLANT
NDL HYPO 25GX1X1/2 BEV (NEEDLE) IMPLANT
NEEDLE 18GX1X1/2 (RX/OR ONLY) (NEEDLE) ×9 IMPLANT
NEEDLE FILTER BLUNT 18X 1/2SAF (NEEDLE) ×2
NEEDLE FILTER BLUNT 18X1 1/2 (NEEDLE) ×1 IMPLANT
NEEDLE HYPO 25GX1X1/2 BEV (NEEDLE) ×3 IMPLANT
NS IRRIG 1000ML POUR BTL (IV SOLUTION) ×3 IMPLANT
PACK CATARACT CUSTOM (CUSTOM PROCEDURE TRAY) ×3 IMPLANT
PAD ARMBOARD 7.5X6 YLW CONV (MISCELLANEOUS) ×6 IMPLANT
PAK PIK VITRECTOMY CVS 25GA (OPHTHALMIC) ×2 IMPLANT
ROLLS DENTAL (MISCELLANEOUS) ×4 IMPLANT
SOLUTION ANTI FOG 6CC (MISCELLANEOUS) ×3 IMPLANT
SPEAR EYE SURG WECK-CEL (MISCELLANEOUS) IMPLANT
SPECIMEN JAR SMALL (MISCELLANEOUS) IMPLANT
SUT CHROMIC 7 0 TG140 8 (SUTURE) ×3 IMPLANT
SUT ETHILON 10 0 CS140 6 (SUTURE) ×6 IMPLANT
SUT ETHILON 9 0 TG140 8 (SUTURE) ×3 IMPLANT
SUT SILK 2 0 (SUTURE) ×3
SUT SILK 2-0 18XBRD TIE 12 (SUTURE) IMPLANT
SUT VICRYL 7 0 TG140 8 (SUTURE) ×2 IMPLANT
SYR INSULIN 1ML 31GX6 SAFETY (SYRINGE) ×2 IMPLANT
TOWEL GREEN STERILE FF (TOWEL DISPOSABLE) ×2 IMPLANT
WATER STERILE IRR 1000ML POUR (IV SOLUTION) ×3 IMPLANT

## 2018-02-09 NOTE — Progress Notes (Signed)
Surgery is on hold until MD and patient's son Natalie Spencer) finish discussing the case and make a decison. OR aware. Pt's POA (Angie) is at the bedside. Yulian Gosney, Dayton Scrape, RN

## 2018-02-09 NOTE — Brief Op Note (Signed)
02/09/2018  6:14 PM  PATIENT:  Natalie Spencer  85 y.o. female  PRE-OPERATIVE DIAGNOSIS:  GLOBE RUPTURE, LEFT EYE  POST-OPERATIVE DIAGNOSIS:  GLOBE RUPTURE, LEFT EYE  PROCEDURE:  Procedure(s): REPAIR OF RUPTURED GLOBE LEFT EYE (Left)  SURGEON:  Surgeon(s) and Role:    * Carmela Rima, MD - Primary  PHYSICIAN ASSISTANT:   ASSISTANTS: none   ANESTHESIA:   local and general  EBL:  minimal   BLOOD ADMINISTERED:none  DRAINS: none   LOCAL MEDICATIONS USED:  MARCAINE    and LIDOCAINE   SPECIMEN:  No Specimen  DISPOSITION OF SPECIMEN:  N/A  COUNTS:  YES  TOURNIQUET:  * No tourniquets in log *  DICTATION: .Note written in EPIC  PLAN OF CARE: Admit to inpatient   PATIENT DISPOSITION:  PACU - hemodynamically stable.   Delay start of Pharmacological VTE agent (>24hrs) due to surgical blood loss or risk of bleeding: not applicable

## 2018-02-09 NOTE — Progress Notes (Signed)
Subjective: The patient is somnolent but arousable.  She is in no apparent distress.  She is in mittens.  Objective: Vital signs in last 24 hours: Temp:  [97.9 F (36.6 C)-98.5 F (36.9 C)] 98.3 F (36.8 C) (01/28 0400) Pulse Rate:  [78-95] 80 (01/27 2200) Resp:  [11-29] 11 (01/28 0700) BP: (115-173)/(51-125) 142/75 (01/28 0700) SpO2:  [90 %-100 %] 93 % (01/27 2200) There is no height or weight on file to calculate BMI.   Intake/Output from previous day: 01/27 0701 - 01/28 0700 In: 386.6 [I.V.:386.6] Out: 1250 [Urine:1250] Intake/Output this shift: No intake/output data recorded.  Physical exam the patient is somnolent but arousable.  She will answer simple questions.  She follows commands.  She is moving all 4 extremities.  Lab Results: Recent Labs    02/08/18 0625  WBC 7.0  HGB 9.8*  HCT 32.0*  PLT 350   BMET Recent Labs    02/08/18 0625  NA 138  K 3.5  CL 104  CO2 25  GLUCOSE 111*  BUN 18  CREATININE 0.91  CALCIUM 9.2    Studies/Results: Ct Head Wo Contrast  Result Date: 02/08/2018 CLINICAL DATA:  Found on floor. Facial trauma. Initial encounter. EXAM: CT HEAD WITHOUT CONTRAST CT MAXILLOFACIAL WITHOUT CONTRAST CT CERVICAL SPINE WITHOUT CONTRAST TECHNIQUE: Multidetector CT imaging of the head, cervical spine, and maxillofacial structures were performed using the standard protocol without intravenous contrast. Multiplanar CT image reconstructions of the cervical spine and maxillofacial structures were also generated. COMPARISON:  Head CT 12/07/2017. Cervical spine CT 08/05/2017. Maxillofacial CT 02/16/2013. FINDINGS: CT HEAD FINDINGS Brain: A few hemorrhagic contusions are present anteriorly in the left frontal lobe measuring up to 11 mm. Scattered small volume acute subarachnoid hemorrhage is most notable over the left parietal convexity. No acute infarct, midline shift, or extra-axial fluid collection is identified. Cerebral atrophy is most notable in the  parietal and temporal lobes. Cerebral white matter hypodensities are similar to the prior study and nonspecific but compatible with mild chronic small vessel ischemic disease. A chronic lacunar infarct is again noted in the right thalamus. Vascular: Calcified atherosclerosis at the skull base. No hyperdense vessel. Skull: No fracture or suspicious osseous lesion. Other: Frontal scalp and left periorbital swelling. CT MAXILLOFACIAL FINDINGS Osseous: No acute fracture or mandibular dislocation. Orbits: Left orbital vitreous hemorrhage with moderate volume hemorrhage surrounding the left globe including posteriorly in the intraconal space. Poorly defined margins of the left globe with abnormal globe contour and mild globe volume loss. Bilateral cataract extraction. Sinuses: Mild scattered mucosal thickening in the paranasal sinuses. No fluid. Clear mastoid air cells. Soft tissues: Moderate left periorbital hematoma with swelling extending inferiorly and laterally in the left face. CT CERVICAL SPINE FINDINGS Alignment: Unchanged and near anatomic alignment. No evidence of traumatic subluxation. Skull base and vertebrae: No acute fracture or suspicious osseous lesion. Soft tissues and spinal canal: No prevertebral fluid or swelling. No visible canal hematoma. Disc levels: Unchanged advanced disc space narrowing at C5-6 and C6-7 and moderate narrowing at C3-4. Mild-to-moderate multilevel facet arthrosis. Upper chest: Clear lung apices. Other: Mild calcified atherosclerosis of the carotid arteries. IMPRESSION: 1. Small hemorrhagic contusions in the left frontal lobe and scattered small volume subarachnoid hemorrhage. 2. Left orbital trauma as above including evidence of left globe rupture. 3. No evidence of acute maxillofacial or cervical spine fracture. Critical Value/emergent results were called by telephone at the time of interpretation on 02/08/2018 at 7:42 am to Dr. Arthor Captain , who verbally acknowledged these  results. Electronically Signed   By: Sebastian Ache M.D.   On: 02/08/2018 07:44   Ct Cervical Spine Wo Contrast  Result Date: 02/08/2018 CLINICAL DATA:  Found on floor. Facial trauma. Initial encounter. EXAM: CT HEAD WITHOUT CONTRAST CT MAXILLOFACIAL WITHOUT CONTRAST CT CERVICAL SPINE WITHOUT CONTRAST TECHNIQUE: Multidetector CT imaging of the head, cervical spine, and maxillofacial structures were performed using the standard protocol without intravenous contrast. Multiplanar CT image reconstructions of the cervical spine and maxillofacial structures were also generated. COMPARISON:  Head CT 12/07/2017. Cervical spine CT 08/05/2017. Maxillofacial CT 02/16/2013. FINDINGS: CT HEAD FINDINGS Brain: A few hemorrhagic contusions are present anteriorly in the left frontal lobe measuring up to 11 mm. Scattered small volume acute subarachnoid hemorrhage is most notable over the left parietal convexity. No acute infarct, midline shift, or extra-axial fluid collection is identified. Cerebral atrophy is most notable in the parietal and temporal lobes. Cerebral white matter hypodensities are similar to the prior study and nonspecific but compatible with mild chronic small vessel ischemic disease. A chronic lacunar infarct is again noted in the right thalamus. Vascular: Calcified atherosclerosis at the skull base. No hyperdense vessel. Skull: No fracture or suspicious osseous lesion. Other: Frontal scalp and left periorbital swelling. CT MAXILLOFACIAL FINDINGS Osseous: No acute fracture or mandibular dislocation. Orbits: Left orbital vitreous hemorrhage with moderate volume hemorrhage surrounding the left globe including posteriorly in the intraconal space. Poorly defined margins of the left globe with abnormal globe contour and mild globe volume loss. Bilateral cataract extraction. Sinuses: Mild scattered mucosal thickening in the paranasal sinuses. No fluid. Clear mastoid air cells. Soft tissues: Moderate left periorbital  hematoma with swelling extending inferiorly and laterally in the left face. CT CERVICAL SPINE FINDINGS Alignment: Unchanged and near anatomic alignment. No evidence of traumatic subluxation. Skull base and vertebrae: No acute fracture or suspicious osseous lesion. Soft tissues and spinal canal: No prevertebral fluid or swelling. No visible canal hematoma. Disc levels: Unchanged advanced disc space narrowing at C5-6 and C6-7 and moderate narrowing at C3-4. Mild-to-moderate multilevel facet arthrosis. Upper chest: Clear lung apices. Other: Mild calcified atherosclerosis of the carotid arteries. IMPRESSION: 1. Small hemorrhagic contusions in the left frontal lobe and scattered small volume subarachnoid hemorrhage. 2. Left orbital trauma as above including evidence of left globe rupture. 3. No evidence of acute maxillofacial or cervical spine fracture. Critical Value/emergent results were called by telephone at the time of interpretation on 02/08/2018 at 7:42 am to Dr. Arthor Captain , who verbally acknowledged these results. Electronically Signed   By: Sebastian Ache M.D.   On: 02/08/2018 07:44   Dg Hand Complete Right  Result Date: 02/08/2018 CLINICAL DATA:  Third digit pain, no known injury, initial encounter EXAM: RIGHT HAND - COMPLETE 3+ VIEW COMPARISON:  12/07/2017 FINDINGS: Mild degenerative changes of the interphalangeal joints are seen. Mild carpal degenerative changes are noted as well. No acute fracture or dislocation is seen. No gross soft tissue abnormality is noted. IMPRESSION: Mild degenerative change without acute abnormality. Electronically Signed   By: Alcide Clever M.D.   On: 02/08/2018 09:47   Vas [place] Lower Extremity Venous (dvt)  Result Date: 02/08/2018  Lower Venous Study Indications: Edema, and Swelling.  Limitations: Patient positioning. Performing Technologist: Blanch Media RVS  Examination Guidelines: A complete evaluation includes B-mode imaging, spectral Doppler, color Doppler, and power  Doppler as needed of all accessible portions of each vessel. Bilateral testing is considered an integral part of a complete examination. Limited examinations for reoccurring indications may be  performed as noted.  Right Venous Findings: +---------+---------------+---------+-----------+----------+-------+          CompressibilityPhasicitySpontaneityPropertiesSummary +---------+---------------+---------+-----------+----------+-------+ CFV      Full           Yes      Yes                          +---------+---------------+---------+-----------+----------+-------+ SFJ      Full                                                 +---------+---------------+---------+-----------+----------+-------+ FV Prox  Full                                                 +---------+---------------+---------+-----------+----------+-------+ FV Mid   Full                                                 +---------+---------------+---------+-----------+----------+-------+ FV DistalFull                                                 +---------+---------------+---------+-----------+----------+-------+ PFV      Full                                                 +---------+---------------+---------+-----------+----------+-------+ POP      Full           Yes      Yes                          +---------+---------------+---------+-----------+----------+-------+ PTV      Full                                                 +---------+---------------+---------+-----------+----------+-------+ PERO     Full                                                 +---------+---------------+---------+-----------+----------+-------+  Left Venous Findings: +---------+---------------+---------+-----------+----------+--------------+          CompressibilityPhasicitySpontaneityPropertiesSummary        +---------+---------------+---------+-----------+----------+--------------+ CFV       Full           Yes      Yes                                 +---------+---------------+---------+-----------+----------+--------------+ SFJ      Full                                                        +---------+---------------+---------+-----------+----------+--------------+  FV Prox  Full                                                        +---------+---------------+---------+-----------+----------+--------------+ FV Mid   Full                                                        +---------+---------------+---------+-----------+----------+--------------+ FV DistalFull                                                        +---------+---------------+---------+-----------+----------+--------------+ PFV      Full                                                        +---------+---------------+---------+-----------+----------+--------------+ POP      Full           Yes      Yes                                 +---------+---------------+---------+-----------+----------+--------------+ PTV      Full                                                        +---------+---------------+---------+-----------+----------+--------------+ PERO                                                  Not visualized +---------+---------------+---------+-----------+----------+--------------+    Summary: Right: There is no evidence of deep vein thrombosis in the lower extremity. No cystic structure found in the popliteal fossa. Left: There is no evidence of deep vein thrombosis in the lower extremity. No cystic structure found in the popliteal fossa.  *See table(s) above for measurements and observations.    Preliminary    Ct Maxillofacial Wo Contrast  Result Date: 02/08/2018 CLINICAL DATA:  Found on floor. Facial trauma. Initial encounter. EXAM: CT HEAD WITHOUT CONTRAST CT MAXILLOFACIAL WITHOUT CONTRAST CT CERVICAL SPINE WITHOUT CONTRAST TECHNIQUE: Multidetector CT  imaging of the head, cervical spine, and maxillofacial structures were performed using the standard protocol without intravenous contrast. Multiplanar CT image reconstructions of the cervical spine and maxillofacial structures were also generated. COMPARISON:  Head CT 12/07/2017. Cervical spine CT 08/05/2017. Maxillofacial CT 02/16/2013. FINDINGS: CT HEAD FINDINGS Brain: A few hemorrhagic contusions are present anteriorly in the left frontal lobe measuring up to 11 mm. Scattered small volume acute subarachnoid hemorrhage is most notable over the left parietal convexity. No acute infarct, midline shift, or extra-axial fluid  collection is identified. Cerebral atrophy is most notable in the parietal and temporal lobes. Cerebral white matter hypodensities are similar to the prior study and nonspecific but compatible with mild chronic small vessel ischemic disease. A chronic lacunar infarct is again noted in the right thalamus. Vascular: Calcified atherosclerosis at the skull base. No hyperdense vessel. Skull: No fracture or suspicious osseous lesion. Other: Frontal scalp and left periorbital swelling. CT MAXILLOFACIAL FINDINGS Osseous: No acute fracture or mandibular dislocation. Orbits: Left orbital vitreous hemorrhage with moderate volume hemorrhage surrounding the left globe including posteriorly in the intraconal space. Poorly defined margins of the left globe with abnormal globe contour and mild globe volume loss. Bilateral cataract extraction. Sinuses: Mild scattered mucosal thickening in the paranasal sinuses. No fluid. Clear mastoid air cells. Soft tissues: Moderate left periorbital hematoma with swelling extending inferiorly and laterally in the left face. CT CERVICAL SPINE FINDINGS Alignment: Unchanged and near anatomic alignment. No evidence of traumatic subluxation. Skull base and vertebrae: No acute fracture or suspicious osseous lesion. Soft tissues and spinal canal: No prevertebral fluid or swelling. No  visible canal hematoma. Disc levels: Unchanged advanced disc space narrowing at C5-6 and C6-7 and moderate narrowing at C3-4. Mild-to-moderate multilevel facet arthrosis. Upper chest: Clear lung apices. Other: Mild calcified atherosclerosis of the carotid arteries. IMPRESSION: 1. Small hemorrhagic contusions in the left frontal lobe and scattered small volume subarachnoid hemorrhage. 2. Left orbital trauma as above including evidence of left globe rupture. 3. No evidence of acute maxillofacial or cervical spine fracture. Critical Value/emergent results were called by telephone at the time of interpretation on 02/08/2018 at 7:42 am to Dr. Arthor CaptainABIGAIL HARRIS , who verbally acknowledged these results. Electronically Signed   By: Sebastian AcheAllen  Grady M.D.   On: 02/08/2018 07:44    Assessment/Plan: Cerebral contusion: We are awaiting the patient's follow-up head CT.  She may need some Ativan for sedation to hold still for the scan.  Eye surgery is planned later on today.  LOS: 1 day     Cristi LoronJeffrey D Javarius Tsosie 02/09/2018, 7:35 AM

## 2018-02-09 NOTE — Anesthesia Procedure Notes (Signed)
Procedure Name: Intubation Date/Time: 02/09/2018 5:13 PM Performed by: Moshe Salisbury, CRNA Pre-anesthesia Checklist: Patient identified, Emergency Drugs available, Suction available and Patient being monitored Patient Re-evaluated:Patient Re-evaluated prior to induction Oxygen Delivery Method: Circle System Utilized Preoxygenation: Pre-oxygenation with 100% oxygen Induction Type: IV induction Ventilation: Mask ventilation without difficulty Laryngoscope Size: Mac and 3 Grade View: Grade II Tube type: Oral Tube size: 7.0 mm Number of attempts: 1 Airway Equipment and Method: Stylet Placement Confirmation: ETT inserted through vocal cords under direct vision,  positive ETCO2 and breath sounds checked- equal and bilateral Secured at: 20 cm Tube secured with: Tape Dental Injury: Teeth and Oropharynx as per pre-operative assessment

## 2018-02-09 NOTE — Discharge Instructions (Signed)
Keep patch in place to protect eye and ensure no rubbing of eye.  Maxitrol ointment left eye BID.

## 2018-02-09 NOTE — Progress Notes (Signed)
RN verified the presence of a signed informed consent that matches stated procedure by patient's healthcare power of attorney. Verified patient's armband matches name and birth date as stated by patient's healthcare power of attorney's stated . Verified NPO status and that all jewelry, contact, glasses, dentures, and partials had been removed (if applicable). Ring removed by healthcare power of attorney and she has it.

## 2018-02-09 NOTE — Progress Notes (Signed)
BP 172- 180/89-98, SR 90's. Pt very sedated. Dr Chaney Malling notified-new order for IV Labetelol.

## 2018-02-09 NOTE — Progress Notes (Signed)
Patient is not compliant with care plan. She is very confused, restless, and keeps taking off the left eye shield. She has made multiple attempts to get out of bed and does not have an accurate understanding of her own abilities and safety. MD contacted and patient will be placed in a soft waist restraint.  Bed alarm, floor mats, proper toileting equipment, and call bell all in place.

## 2018-02-09 NOTE — CV Procedure (Signed)
Echocardiogram not completed, patient is going for surgery.  Leta Jungling RDCS

## 2018-02-09 NOTE — Op Note (Signed)
Natalie Spencer 02/09/2018 Diagnosis: Globe rupture left eye   Procedure: Globe rupture repair left eye and injection of intravitreal antibiotics Operative Eye:  left eye  Surgeon: Harrold DonathNarendra Mafabhai Carmine Carrozza Estimated Blood Loss: minimal Specimens for Pathology:  None Complications: none   The  patient was prepped and draped in the usual fashion for ocular surgery on the  left eye .  A lid speculum was placed. 360 degree conjunctival peritomy was performed. Tenon's was dissected back in all 4 quadrants.  4-0 silk suture was used to loop the superior and lateral rectus muscles.  A posterior scleral rupture was noted 5 mm posterior to the muscle insertion from 1 o'clock to 4 o'clock with exposed uveal tissue.  The wound was reapproximated with tenon's with limited success given the extent of rupture and posterior location.  Further attempts to close the wound would result in complete expulsion of uveal contents posteriorly.  Tenons and conjunctiva were reapproximated to the limbus with 7-0 Vicryl suture.  Intravitreal Vancomycin 1mg  and Ceftazadime 2.25mg  were injected in the vitreous space.  Vitreous hemorrhage and choroidal hemorrhage were noted.  BSS was injected in the anterior chamber to reform the globe ensuring limited increase in intraocular pressure to ensure no expulsion of uveal contents.  2 cc of retrobulbar block was carefully placed with a blunt canula again insuring no increase in pressure on the globe.  Subconjunctival injections of  Dexamethasone 4mg /281ml was placed in the infero-medial quadrant.   The speculum and drapes were removed and the eye was patched with Polymixin/Bacitracin ophthalmic ointment. An eye shield was placed and the patient was transferred alert and conversant with stable vital signs to the post operative recovery area.  The patient tolerated the procedure well and no complications were noted.  Harrold DonathNarendra Mafabhai Tylie Golonka MD

## 2018-02-09 NOTE — Care Management Note (Signed)
Case Management Note  Patient Details  Name: Natalie Spencer MRN: [id number] Date of Birth: Mar 29, 1926  Subjective/Objective:  85 yo admitted after fall at memory care with left frontal intracerebral contusion and left globe rupture.  PTA, pt resided at [facility] in [facility].                  Action/Plan: Pt to OR for Lt eye surgery today.  Pt's neighbor, Natalie Spencer is POA, and has brought in documentation of this.  Pt's adopted son is upset that he is not the decision maker for pt.    Expected Discharge Date:                  Expected Discharge Plan:  Skilled Nursing Facility  In-House Referral:  Clinical Social Work  Discharge planning Services  CM Consult  Post Acute Care Choice:    Choice offered to:     DME Arranged:    DME Agency:     HH Arranged:    HH Agency:     Status of Service:  In process, will continue to follow  If discussed at Long Length of Stay Meetings, dates discussed:    Additional Comments:  Quintella Baton, RN, BSN  Trauma/Neuro ICU Case Manager [phone]

## 2018-02-09 NOTE — Progress Notes (Signed)
Follow up - Trauma and Critical Care  Patient Details:    Natalie Spencer is an 85 y.o. female.  Lines/tubes : External Urinary Catheter (Active)  Collection Container Dedicated Suction Canister 02/08/2018  8:00 PM  Intervention Equipment Changed 02/08/2018  8:00 PM  Output (mL) 850 mL 02/09/2018  7:00 AM    Microbiology/Sepsis markers: Results for orders placed or performed during the hospital encounter of 02/08/18  MRSA PCR Screening     Status: None   Collection Time: 02/08/18 10:33 AM  Result Value Ref Range Status   MRSA by PCR NEGATIVE NEGATIVE Final    Comment:        The GeneXpert MRSA Assay (FDA approved for NASAL specimens only), is one component of a comprehensive MRSA colonization surveillance program. It is not intended to diagnose MRSA infection nor to guide or monitor treatment for MRSA infections. Performed at [facility], [address] [place], [place] [address]     Anti-infectives:  Anti-infectives (From admission, onward)   None      Best Practice/Protocols:  VTE Prophylaxis: Mechanical   Consults: Treatment Team:  Tressie StalkerJenkins, Jeffrey, MD    Events:  Chief Complaint/Subjective:    Overnight Issues: No changes  Objective:  Vital signs for last 24 hours: Temp:  [97.9 F (36.6 C)-98.5 F (36.9 C)] 98.3 F (36.8 C) (01/28 0400) Pulse Rate:  [78-95] 80 (01/27 2200) Resp:  [11-29] 11 (01/28 0700) BP: (115-173)/(51-125) 142/75 (01/28 0700) SpO2:  [90 %-100 %] 93 % (01/27 2200)  Hemodynamic parameters for last 24 hours:    Intake/Output from previous day: 01/27 0701 - 01/28 0700 In: 386.6 [I.V.:386.6] Out: 1250 [Urine:1250]  Intake/Output this shift: No intake/output data recorded.  Vent settings for last 24 hours:    Physical Exam:  Gen: NAD HEENT: left eye contusion, right eye EOMI Resp: CTAB Cardiovascular: RRR Abdomen: soft, NT, ND Ext: no deformity, +edema legs Neuro: GCS 15, AO x 1  Results for orders placed or  performed during the hospital encounter of 02/08/18 (from the past 24 hour(s))  MRSA PCR Screening     Status: None   Collection Time: 02/08/18 10:33 AM  Result Value Ref Range   MRSA by PCR NEGATIVE NEGATIVE     Assessment/Plan:   Fall with TBI Dementia: No medications PTA. Relocated to memory care unit on 01/26/2018.  History of falls: Neighbor reports h/o fall last year with right wrist injury. Hypertension: H/o htn on no medications PTA. SBP 150s. PRN antihypertensives ordered.   TBI: CT head on 02/08/2018 showed small hemorrhagic contusions in the left frontal lobe and scattered small volume subarachnoid hemorrhage, Patient with h/o dementia at baseline. Alert and oriented to self, no focal neuro deficits. Neurosurgery consulted.  Left globe injury: Maxillofacial CT on 02/08/2018 showed left orbital trauma with left globe rupture. Left eye pain. Pain control regimen with PRN acetaminophen, tramadol and morphine ordered. Ophthalmology consulted, tentative plan for OR today   Anemia: Hgb 9.8 and Hct 32.0 on arrival. F/u labs today Bilateral lower extremity edema: BLE swollen, R>L. Neighbor reports this is new since relocation. US negative, ECHO pending  FEN: NPO until seen by consultants. IVF ordered.  VTE: SCD's; Hold chemical prophylaxis given TBI.  ID: None currently pending opthalmology consult.  Follow up: Neurosurgery, Opthalmology   LOS: 1 day   Additional comments:None  Critical Care Total Time*: 15 Minutes  De BlanchLuke Aaron Treyson Axel 02/09/2018  *Care during the described time interval was provided by me and/or other providers  on the critical care team.  I have reviewed this patient's available data, including medical history, events of note, physical examination and test results as part of my evaluation.

## 2018-02-09 NOTE — Transfer of Care (Signed)
Immediate Anesthesia Transfer of Care Note  Patient: [facility]  Procedure(s) Performed: REPAIR OF RUPTURED GLOBE LEFT EYE (Left Eye)  Patient Location: PACU  Anesthesia Type:General  Level of Consciousness: drowsy  Airway & Oxygen Therapy: Patient Spontanous Breathing and Patient connected to nasal cannula oxygen  Post-op Assessment: Report given to RN, Post -op Vital signs reviewed and stable and Patient moving all extremities  Post vital signs: Reviewed and stable  Last Vitals:  Vitals Value Taken Time  BP 172/87 02/09/2018  6:27 PM  Temp    Pulse 91 02/09/2018  6:28 PM  Resp 16 02/09/2018  6:28 PM  SpO2 100 % 02/09/2018  6:28 PM  Vitals shown include unvalidated device data.  Last Pain:  Vitals:   02/09/18 1200  TempSrc: Oral  PainSc:          Complications: No apparent anesthesia complications

## 2018-02-09 NOTE — H&P (Signed)
Date of examination:  02/09/2018  Indication for surgery: Globe rupture left eye and associated visual loss  Pertinent past medical history:  Past Medical History:  Diagnosis Date  . Ambulatory dysfunction   . Hypertension   . Senile dementia (HCC)     Pertinent ocular history:  Blunt trauma with impact on globe and orbit with associated rupture  Pertinent family history: History reviewed. No pertinent family history.  General:  Healthy appearing, dementia, POA available  Eyes:    Acuity: difficult to obtain  External: left periorbital edema/ecchymosis   Anterior segment: hyphema OS, normal OD       Fundus: Normal  OD, Left - difficult view    CT reviewed - shows choroidal hemorrhage and ruptured globe left eye   Impression:  Globe rupture left eye with associated orbital trauma, vision loss, and choroidal hemorrhage  Plan: Globe rupture repair left eye  Natalie DonathNarendra Mafabhai Ellissa Ayo

## 2018-02-09 NOTE — Progress Notes (Signed)
Occupational Therapy Evaluation Patient Details Name: Natalie Spencer MRN: [id number] DOB: [birth year] Today's Date: 02/09/2018    History of Present Illness 85 yo admitted after fall at memory care with left frontal intracerebral contusion and left globe rupture. 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 Her husband recently passed away, however pt does not recall this information. Pt demonstrates a functional decline requiring min A with mobility @ RW level and mod A with ADL due to below listed deficits. Will benefit from rehab at [facility]. Will follow acutely.     Follow Up Recommendations  SNF;Supervision/Assistance - 24 hour    Equipment Recommendations  None recommended by OT    Recommendations for Other Services       Precautions / Restrictions Precautions Precautions: Fall Restrictions Weight Bearing Restrictions: No      Mobility Bed Mobility Overal bed mobility: Needs Assistance Bed Mobility: Supine to Sit     Supine to sit: Min assist     General bed mobility comments: assist to bring legs off of bed and elevate trunk  Transfers Overall transfer level: Needs assistance   Transfers: Sit to/from Stand Sit to Stand: Min guard         General transfer comment: cues for hand placement and safety    Balance Overall balance assessment: Needs assistance;History of Falls   Sitting balance-Leahy Scale: Good     Standing balance support: Bilateral upper extremity supported Standing balance-Leahy Scale: Poor                             ADL either performed or assessed with clinical judgement   ADL Overall ADL's : Needs assistance/impaired     Grooming: Oral care;Set up;Supervision/safety   Upper Body Bathing: Set up;Supervision/ safety;Sitting   Lower Body Bathing: Moderate assistance;Sit to/from stand   Upper Body Dressing : Minimal assistance;Sitting   Lower Body Dressing:  Moderate assistance;Sit to/from stand   Toilet Transfer: Minimal assistance;Ambulation   Toileting- Clothing Manipulation and Hygiene: Moderate assistance;Sit to/from stand       Functional mobility during ADLs: Minimal assistance;Rolling walker;Cueing for safety;Cueing for sequencing General ADL Comments: Able to sustain attention to brushingteeth; attmepting to rub L eye. Able to distract pt with functional tasks     Vision   Additional Comments: Unsure if pt wears glasses; Pt with significant L eye damage; possible innoculation of L eye     Perception     Praxis      Pertinent Vitals/Pain Pain Assessment: Faces Faces Pain Scale: Hurts a little bit Pain Location: L eye Pain Descriptors / Indicators: Discomfort Pain Intervention(s): Limited activity within patient's tolerance     Hand Dominance Left(assuming; used L to brush her teeth)   Extremity/Trunk Assessment Upper Extremity Assessment Upper Extremity Assessment: Generalized weakness   Lower Extremity Assessment Lower Extremity Assessment: Defer to PT evaluation   Cervical / Trunk Assessment Cervical / Trunk Assessment: Kyphotic   Communication Communication Communication: No difficulties   Cognition Arousal/Alertness: Awake/alert Behavior During Therapy: Flat affect;Impulsive;Restless Overall Cognitive Status: Impaired/Different from baseline Area of Impairment: Orientation;Attention;Memory;Following commands;Problem solving;Safety/judgement;Awareness                 Orientation Level: Place;Time;Situation Current Attention Level: Sustained Memory: Decreased short-term memory Following Commands: Follows one step commands consistently Safety/Judgement: Decreased awareness of deficits;Decreased awareness of safety Awareness: Intellectual Problem Solving: Slow processing General Comments: fmaily not  present for baseline   General Comments       Exercises     Shoulder Instructions      Home  Living Family/patient expects to be discharged to:: Assisted living                             Home Equipment: Walker - 2 wheels;Walker - 4 wheels   Additional Comments: pt has RW and rollator but refuses to use them per son; 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      Prior Functioning/Environment Level of Independence: Independent;Needs assistance  Gait / Transfers Assistance Needed: independent for gait and transfers ADL's / Homemaking Assistance Needed: assist for bathing and dressing            OT Problem List: Decreased strength;Decreased activity tolerance;Impaired balance (sitting and/or standing);Decreased coordination;Decreased cognition;Decreased safety awareness;Decreased knowledge of use of DME or AE;Decreased knowledge of precautions;Pain      OT Treatment/Interventions: Self-care/ADL training;Therapeutic exercise;DME and/or AE instruction;Therapeutic activities;Cognitive remediation/compensation;Visual/perceptual remediation/compensation;Patient/family education;Balance training    OT Goals(Current goals can be found in the care plan section) Acute Rehab OT Goals Patient Stated Goal: "to get this stuff out of my eye" OT Goal Formulation: Patient unable to participate in goal setting Time For Goal Achievement: 02/23/18 Potential to Achieve Goals: Good  OT Frequency: Min 2X/week   Barriers to D/C:            Co-evaluation              AM-PAC OT "6 Clicks" Daily Activity     Outcome Measure Help from another person eating meals?: Total(NPO) Help from another person taking care of personal grooming?: A Little Help from another person toileting, which includes using toliet, bedpan, or urinal?: A Lot Help from another person bathing (including washing, rinsing, drying)?: A Lot Help from another person to put on and taking off regular upper body clothing?: A Lot Help from another person to put on and taking off regular lower body  clothing?: A Lot 6 Click Score: 12   End of Session Equipment Utilized During Treatment: Gait belt;Rolling walker Nurse Communication: Mobility status  Activity Tolerance: Patient tolerated treatment well Patient left: in chair;with call bell/phone within reach;with restraints reapplied(quick release belt)  OT Visit Diagnosis: Unsteadiness on feet (R26.81);Muscle weakness (generalized) (M62.81);History of falling (Z91.81);Other symptoms and signs involving cognitive function;Pain Pain - Right/Left: Left Pain - part of body: (eye)                Time: 2947-6546 OT Time Calculation (min): 32 min Charges:  OT General Charges $OT Visit: 1 Visit OT Evaluation $OT Eval Moderate Complexity: 1 Mod OT Treatments $Self Care/Home Management : 8-22 mins  Luisa Dago, OT/L   Acute OT Clinical Specialist Acute Rehabilitation Services Pager [phone] Office [phone]   [facility] 02/09/2018, 10:38 AM

## 2018-02-09 NOTE — Anesthesia Preprocedure Evaluation (Addendum)
Anesthesia Evaluation  Patient identified by MRN, date of birth, ID band Patient awake  General Assessment Comment:S/P fall at facility TBI with L frontal ICC Ruptured L globe  Reviewed: Allergy & Precautions, NPO status , Patient's Chart, lab work & pertinent test results  Airway Mallampati: II  TM Distance: >3 FB Neck ROM: Full    Dental  (+) Dental Advisory Given, Missing, Poor Dentition, Chipped, Teeth Intact   Pulmonary neg pulmonary ROS,    Pulmonary exam normal breath sounds clear to auscultation       Cardiovascular hypertension, Normal cardiovascular exam Rhythm:Regular Rate:Normal     Neuro/Psych PSYCHIATRIC DISORDERS Dementia Globe rupture, left eye    GI/Hepatic negative GI ROS, Neg liver ROS,   Endo/Other  negative endocrine ROS  Renal/GU negative Renal ROS     Musculoskeletal negative musculoskeletal ROS (+)   Abdominal   Peds  Hematology  (+) Blood dyscrasia, anemia ,   Anesthesia Other Findings Day of surgery medications reviewed with the patient.  Reproductive/Obstetrics                           Anesthesia Physical Anesthesia Plan  ASA: III  Anesthesia Plan: General   Post-op Pain Management:    Induction: Intravenous  PONV Risk Score and Plan: 3 and Ondansetron, Dexamethasone and Treatment may vary due to age or medical condition  Airway Management Planned: Oral ETT  Additional Equipment:   Intra-op Plan:   Post-operative Plan: Extubation in OR  Informed Consent: I have reviewed the patients History and Physical, chart, labs and discussed the procedure including the risks, benefits and alternatives for the proposed anesthesia with the patient or authorized representative who has indicated his/her understanding and acceptance.     Dental advisory given and Consent reviewed with POA  Plan Discussed with: CRNA  Anesthesia Plan Comments:         Anesthesia Quick Evaluation

## 2018-02-10 ENCOUNTER — Encounter (HOSPITAL_COMMUNITY): Payer: Self-pay | Admitting: Ophthalmology

## 2018-02-10 ENCOUNTER — Inpatient Hospital Stay (HOSPITAL_COMMUNITY): Payer: Medicare Other

## 2018-02-10 DIAGNOSIS — I10 Essential (primary) hypertension: Secondary | ICD-10-CM

## 2018-02-10 LAB — ECHOCARDIOGRAM COMPLETE

## 2018-02-10 MED ORDER — POTASSIUM CHLORIDE CRYS ER 10 MEQ PO TBCR
10.0000 meq | EXTENDED_RELEASE_TABLET | Freq: Two times a day (BID) | ORAL | Status: AC
  Administered 2018-02-10 – 2018-02-11 (×3): 10 meq via ORAL
  Filled 2018-02-10 (×3): qty 1

## 2018-02-10 NOTE — Progress Notes (Signed)
[facility] Progress Note  1 Day Post-Op  Subjective: CC: eye pain  Patient reports some pain in eye. Very focused on what day it is and getting days mixed up. Denies pain otherwise. VSS. UOP good.   Objective: Vital signs in last 24 hours: Temp:  [97 F (36.1 C)-98.6 F (37 C)] 98.4 F (36.9 C) (01/29 0800) Pulse Rate:  [64-96] 93 (01/29 0700) Resp:  [13-23] 23 (01/29 0700) BP: (84-177)/(43-98) 147/72 (01/29 0700) SpO2:  [95 %-100 %] 100 % (01/29 0700) Last BM Date: 02/09/18  Intake/Output from previous day: 01/28 0701 - 01/29 0700 In: 1649.2 [I.V.:1649.2] Out: 655 [Urine:625; Blood:30] Intake/Output this shift: Total I/O In: 75 [I.V.:75] Out: -   PE: Gen:  Alert, NAD, pleasant ENT: shield over left eye Card:  Regular rate and rhythm, pedal pulses 2+ BL Pulm:  Normal effort, clear to auscultation bilaterally Abd: Soft, non-tender, non-distended, bowel sounds present  Skin: warm and dry, no rashes  Neuro: had trouble focusing on questions for me but following commands  Lab Results:  Recent Labs    02/08/18 0625 02/09/18 1033  WBC 7.0 6.6  HGB 9.8* 10.4*  HCT 32.0* 33.3*  PLT 350 399   BMET Recent Labs    02/08/18 0625 02/09/18 1033  NA 138 139  K 3.5 3.4*  CL 104 107  CO2 25 23  GLUCOSE 111* 94  BUN 18 10  CREATININE 0.91 0.72  CALCIUM 9.2 9.3   PT/INR No results for input(s): LABPROT, INR in the last 72 hours. CMP     Component Value Date/Time   NA 139 02/09/2018 1033   K 3.4 (L) 02/09/2018 1033   CL 107 02/09/2018 1033   CO2 23 02/09/2018 1033   GLUCOSE 94 02/09/2018 1033   BUN 10 02/09/2018 1033   CREATININE 0.72 02/09/2018 1033   CALCIUM 9.3 02/09/2018 1033   GFRNONAA >60 02/09/2018 1033   GFRAA >60 02/09/2018 1033   Lipase  No results found for: LIPASE     Studies/Results: Ct Head Wo Contrast  Result Date: 02/09/2018 CLINICAL DATA:  Followup intracranial hemorrhage EXAM: CT HEAD WITHOUT CONTRAST TECHNIQUE:  Contiguous axial images were obtained from the base of the skull through the vertex without intravenous contrast. COMPARISON:  02/08/2018 FINDINGS: The study suffers from some motion degradation. Brain: Generalized atrophy as seen previously. The brainstem and cerebellum are normal. Chronic small-vessel ischemic changes of the cerebral hemispheres as seen previously. Small hemorrhagic contusions at the left frontal vertex have not increased. Large contusion measures 1 cm in size. Mild regional edema. Small amount of post traumatic subarachnoid hemorrhage, but coming less apparent. No evidence of increased bleeding. No mass effect or shift. No extra-axial collection. Vascular: There is atherosclerotic calcification of the major vessels at the base of the brain. Skull: No skull fracture. Sinuses/Orbits: Left orbital/globe soft tissue injury as noted previously. Sinuses are clear. Other: None IMPRESSION: No evidence of increased bleeding. Small hemorrhagic contusions at the left frontal vertex have not enlarged. Small amount of post traumatic subarachnoid hemorrhage, less apparent. No mass effect or shift. Left orbital and globe injury as previously described. Electronically Signed   By: Paulina Fusi M.D.   On: 02/09/2018 08:43   Dg Hand Complete Right  Result Date: 02/08/2018 CLINICAL DATA:  Third digit pain, no known injury, initial encounter EXAM: RIGHT HAND - COMPLETE 3+ VIEW COMPARISON:  12/07/2017 FINDINGS: Mild degenerative changes of the interphalangeal joints are seen. Mild carpal degenerative changes are noted as well.  No acute fracture or dislocation is seen. No gross soft tissue abnormality is noted. IMPRESSION: Mild degenerative change without acute abnormality. Electronically Signed   By: Alcide Clever M.D.   On: 02/08/2018 09:47   Vas [place] Lower Extremity Venous (dvt)  Result Date: 02/09/2018  Lower Venous Study Indications: Edema, and Swelling.  Limitations: Patient positioning. Performing  Technologist: Blanch Media RVS  Examination Guidelines: A complete evaluation includes B-mode imaging, spectral Doppler, color Doppler, and power Doppler as needed of all accessible portions of each vessel. Bilateral testing is considered an integral part of a complete examination. Limited examinations for reoccurring indications may be performed as noted.  Right Venous Findings: +---------+---------------+---------+-----------+----------+-------+          CompressibilityPhasicitySpontaneityPropertiesSummary +---------+---------------+---------+-----------+----------+-------+ CFV      Full           Yes      Yes                          +---------+---------------+---------+-----------+----------+-------+ SFJ      Full                                                 +---------+---------------+---------+-----------+----------+-------+ FV Prox  Full                                                 +---------+---------------+---------+-----------+----------+-------+ FV Mid   Full                                                 +---------+---------------+---------+-----------+----------+-------+ FV DistalFull                                                 +---------+---------------+---------+-----------+----------+-------+ PFV      Full                                                 +---------+---------------+---------+-----------+----------+-------+ POP      Full           Yes      Yes                          +---------+---------------+---------+-----------+----------+-------+ PTV      Full                                                 +---------+---------------+---------+-----------+----------+-------+ PERO     Full                                                 +---------+---------------+---------+-----------+----------+-------+  Left Venous Findings: +---------+---------------+---------+-----------+----------+--------------+           CompressibilityPhasicitySpontaneityPropertiesSummary        +---------+---------------+---------+-----------+----------+--------------+ CFV      Full           Yes      Yes                                 +---------+---------------+---------+-----------+----------+--------------+ SFJ      Full                                                        +---------+---------------+---------+-----------+----------+--------------+ FV Prox  Full                                                        +---------+---------------+---------+-----------+----------+--------------+ FV Mid   Full                                                        +---------+---------------+---------+-----------+----------+--------------+ FV DistalFull                                                        +---------+---------------+---------+-----------+----------+--------------+ PFV      Full                                                        +---------+---------------+---------+-----------+----------+--------------+ POP      Full           Yes      Yes                                 +---------+---------------+---------+-----------+----------+--------------+ PTV      Full                                                        +---------+---------------+---------+-----------+----------+--------------+ PERO                                                  Not visualized +---------+---------------+---------+-----------+----------+--------------+    Summary: Right: There is no evidence of deep vein thrombosis in the lower extremity. No cystic structure found in the popliteal fossa. Left: There is no evidence of deep vein thrombosis in the lower extremity. No cystic structure found  in the popliteal fossa.  *See table(s) above for measurements and observations. Electronically signed by Fabienne Brunsharles Fields MD on 02/09/2018 at 11:52:00 AM.    Final      Anti-infectives: Anti-infectives (From admission, onward)   Start     Dose/Rate Route Frequency Ordered Stop   02/09/18 1816  vancomycin [facility](VANCOCIN) intravitreal injection  Status:  Discontinued       As needed 02/09/18 1816 02/09/18 1823   02/09/18 1815  cefTAZidime (FORTAZ) intravitreal injection  Status:  Discontinued       As needed 02/09/18 1815 02/09/18 1823   02/09/18 1600  vancomycin (VANCOCIN) intravitreal injection 1 mg  Status:  Discontinued     1 mg Intravitreal To Surgery 02/09/18 1552 02/09/18 1949   02/09/18 1600  cefTAZidime (FORTAZ) intravitreal injection 2.25 mg  Status:  Discontinued     2.25 mg Intravitreal To Surgery 02/09/18 1552 02/09/18 1949       Assessment/Plan Fall with TBI Dementia: No medications PTA. Relocated to memory care unit on 01/26/2018.  History of falls: Neighbor reports h/o fall last year with right wrist injury. Hypertension: H/o htn on no medications PTA. PRN antihypertensives ordered.   TBI: CT head on 02/08/2018 showed small hemorrhagic contusions in the left frontal lobe and scattered small volume subarachnoid hemorrhage. F/U head CT stable. Per NS Left globe injury: s/p globe rupture repair with injection of of intravitreal abx 02/09/18 Dr. Allena KatzPatel Anemia: Hgb 10.4, stable Bilateral lower extremity edema: BLE swollen, R>L. Neighbor reports this is new since relocation. US negative, ECHO pending  FEN:CLD - ADAT VTE: SCD's; Hold chemical prophylaxis given TBI. RU:[id number]:intra-operative Follow GN:[id number]:Neurosurgery, Ophthalmology  Transfer to SDU. Therapies recommending SNF. Likely ready for discharge in the next 24-48 hrs.  LOS: 2 days    Wells GuilesKelly Rayburn , [facility] 02/10/2018, 8:58 AM Pager: [phone]

## 2018-02-10 NOTE — Progress Notes (Signed)
Physical Therapy Treatment Patient Details Name: Natalie Spencer MRN: [id number] DOB: 08/21/1926 Today's Date: 02/10/2018    History of Present Illness 85 yo admitted after fall at memory care with left frontal intracerebral contusion and left globe rupture. PMhx: dementia, HTN    PT Comments    Pt with increased ambulation tolerance however remains pleasantly confused which is her baseline. Pt easily distracted and then perseverates on what she's distracted by. Ie. "I'm suppose to have nose surgery not eye surgery." pt with no initiation of task requiring max tactile and verbal cues for all mobility. Pt remains at high falls risk. Acute PT to cont to follow.    Follow Up Recommendations  SNF;Supervision/Assistance - 24 hour     Equipment Recommendations  None recommended by PT    Recommendations for Other Services       Precautions / Restrictions Precautions Precautions: Fall Restrictions Weight Bearing Restrictions: No    Mobility  Bed Mobility               General bed mobility comments: pt up in chair upon PT arrival  Transfers Overall transfer level: Needs assistance Equipment used: None Transfers: Sit to/from Stand Sit to Stand: Mod assist         General transfer comment: max verbal and tactile cues to transfer up into standing due to perseveration on "he is already gone, I went to the garage and he was already gone"  Ambulation/Gait Ambulation/Gait assistance: Min assist Gait Distance (Feet): 200 Feet Assistive device: Rolling walker (2 wheeled) Gait Pattern/deviations: Step-through pattern;Decreased stride length;Shuffle Gait velocity: dec Gait velocity interpretation: <1.8 ft/sec, indicate of risk for recurrent falls General Gait Details: pt easily distracted and perseverating on "when did I have surgery, I'm having nose surgery not eye surgery" pt required minA to cont forward movement/progression with RW as pt with no self initiation to amb, pt with  short shuffled steps   Stairs             Wheelchair Mobility    Modified Rankin (Stroke Patients Only) Modified Rankin (Stroke Patients Only) Pre-Morbid Rankin Score: Moderate disability Modified Rankin: Moderate disability     Balance Overall balance assessment: Needs assistance Sitting-balance support: Feet supported;No upper extremity supported Sitting balance-Leahy Scale: Good     Standing balance support: Bilateral upper extremity supported Standing balance-Leahy Scale: Poor Standing balance comment: dependent on physical assist as pt with decreased safety awarenes and impaired balance                            Cognition Arousal/Alertness: Awake/alert Behavior During Therapy: Flat affect Overall Cognitive Status: History of cognitive impairments - at baseline Area of Impairment: Orientation;Attention;Memory;Following commands;Safety/judgement;Awareness;Problem solving                 Orientation Level: Disoriented to;Place;Time;Situation Current Attention Level: Focused Memory: Decreased short-term memory Following Commands: Follows one step commands with increased time;Follows one step commands inconsistently Safety/Judgement: Decreased awareness of safety;Decreased awareness of deficits Awareness: Intellectual Problem Solving: Slow processing;Decreased initiation;Difficulty sequencing;Requires verbal cues;Requires tactile cues General Comments: pt required tactile cues to complete sit to stand transfer      Exercises      General Comments General comments (skin integrity, edema, etc.): Pt with patch/hard protection on eye      Pertinent Vitals/Pain Pain Assessment: Faces Faces Pain Scale: No hurt    Home Living  Prior Function            PT Goals (current goals can now be found in the care plan section) Progress towards PT goals: Progressing toward goals    Frequency    Min 3X/week       PT Plan Current plan remains appropriate    Co-evaluation              AM-PAC PT "6 Clicks" Mobility   Outcome Measure  Help needed turning from your back to your side while in a flat bed without using bedrails?: A Little Help needed moving from lying on your back to sitting on the side of a flat bed without using bedrails?: A Little Help needed moving to and from a bed to a chair (including a wheelchair)?: A Little Help needed standing up from a chair using your arms (e.g., wheelchair or bedside chair)?: A Little Help needed to walk in hospital room?: A Little Help needed climbing 3-5 steps with a railing? : A Lot 6 Click Score: 17    End of Session Equipment Utilized During Treatment: Gait belt Activity Tolerance: Patient tolerated treatment well Patient left: in chair;with call bell/phone within reach;with chair alarm set Nurse Communication: Mobility status PT Visit Diagnosis: Other abnormalities of gait and mobility (R26.89);Unsteadiness on feet (R26.81);History of falling (Z91.81)     Time: 1250-1309 PT Time Calculation (min) (ACUTE ONLY): 19 min  Charges:  $Gait Training: 8-22 mins                     Natalie Spencer, PT, DPT Acute Rehabilitation Services Pager #: [phone] Office #: [phone]    Natalie Spencer 02/10/2018, 2:15 PM

## 2018-02-10 NOTE — Progress Notes (Signed)
Spoke with pt's POA, Angie to discuss possible SNF placement, as recommended by PT/OT.  POA states she will have to withdraw POA at this time, as pt's son, Loraine Leriche is threatening her with litigation if she continues to make decisions for pt.  Per Karoline Caldwell, son texted her and stated that the POA is in litigation, and that an interim guardian has been assigned.  Angie's personal lawyer has checked with [place]courthouses, and there is currently no filing for interim guardian noted.  Angie states that she feels like she is letting pt down, but is frightened of son and what he might do.  She and her lawyer plan to file guardianship paperwork in [place] first thing in the AM.  Son is not aware of this, and she asks that we not share this information with him.  POA to call RN Case Manager with guardian's name and contact information once known.    Case was discussed with Trauma CSW.  Will follow with updates as available.   Quintella Baton, RN, BSN  Trauma/Neuro ICU Case Manager [phone]

## 2018-02-10 NOTE — Progress Notes (Signed)
  Echocardiogram 2D Echocardiogram has been performed.  Leta Jungling M 02/10/2018, 11:03 AM

## 2018-02-11 LAB — CBC
HCT: 30.2 % — ABNORMAL LOW (ref 36.0–46.0)
Hemoglobin: 9.5 g/dL — ABNORMAL LOW (ref 12.0–15.0)
MCH: 28.3 pg (ref 26.0–34.0)
MCHC: 31.5 g/dL (ref 30.0–36.0)
MCV: 89.9 fL (ref 80.0–100.0)
PLATELETS: 375 10*3/uL (ref 150–400)
RBC: 3.36 MIL/uL — ABNORMAL LOW (ref 3.87–5.11)
RDW: 13.2 % (ref 11.5–15.5)
WBC: 7.9 10*3/uL (ref 4.0–10.5)
nRBC: 0 % (ref 0.0–0.2)

## 2018-02-11 LAB — BASIC METABOLIC PANEL
Anion gap: 6 (ref 5–15)
BUN: 18 mg/dL (ref 8–23)
CO2: 25 mmol/L (ref 22–32)
Calcium: 8.8 mg/dL — ABNORMAL LOW (ref 8.9–10.3)
Chloride: 108 mmol/L (ref 98–111)
Creatinine, Ser: 1.03 mg/dL — ABNORMAL HIGH (ref 0.44–1.00)
GFR calc non Af Amer: 48 mL/min — ABNORMAL LOW (ref >60)
GFR, EST AFRICAN AMERICAN: 56 mL/min — AB (ref >60)
Glucose, Bld: 102 mg/dL — ABNORMAL HIGH (ref 70–99)
Potassium: 4 mmol/L (ref 3.5–5.1)
SODIUM: 139 mmol/L (ref 135–145)

## 2018-02-11 MED ORDER — KCL IN DEXTROSE-NACL 20-5-0.45 MEQ/L-%-% IV SOLN: INTRAVENOUS | Status: DC

## 2018-02-11 MED ORDER — SODIUM CHLORIDE 0.9 % IV SOLN
INTRAVENOUS | Status: DC
  Administered 2018-02-11 – 2018-02-12 (×2): via INTRAVENOUS

## 2018-02-11 NOTE — Progress Notes (Signed)
Touched base with pt's neighbor and legal POA, Angie:  She states that temporary guardianship paperwork has been filed with [employer] as of this morning.  Her lawyer states that guardian may be named within a couple of days through the Corporation of [address].  Angie states she is no longer communicating with pt's son, but has visited pt in the hospital today.  Awaiting temporary guardianship to assist with SNF placement.  Quintella Baton, RN, BSN  Trauma/Neuro ICU Case Manager [phone]

## 2018-02-11 NOTE — Care Management Important Message (Signed)
Important Message  Patient Details  Name: Natalie Spencer MRN: [id number] Date of Birth: 17-Oct-1926   Medicare Important Message Given:  Yes Patient was not able to sign due to illness.  Unsigned copy at the patient bedside.   Sedalia Greeson 02/11/2018, 3:35 PM

## 2018-02-11 NOTE — Anesthesia Postprocedure Evaluation (Signed)
Anesthesia Post Note  Patient: Natalie Spencer  Procedure(s) Performed: REPAIR OF RUPTURED GLOBE LEFT EYE (Left Eye)     Patient location during evaluation: PACU Anesthesia Type: General Level of consciousness: awake and alert Pain management: pain level controlled Vital Signs Assessment: post-procedure vital signs reviewed and stable Respiratory status: spontaneous breathing, nonlabored ventilation, respiratory function stable and patient connected to nasal cannula oxygen Cardiovascular status: blood pressure returned to baseline and stable Postop Assessment: no apparent nausea or vomiting Anesthetic complications: no    Last Vitals:  Vitals:   02/11/18 0000 02/11/18 0300  BP: (!) 113/57 (!) 161/76  Pulse: 88   Resp:    Temp: 36.4 C 36.8 C  SpO2: 99% 99%    Last Pain:  Vitals:   02/11/18 0300  TempSrc: Oral  PainSc:                  [name] S

## 2018-02-11 NOTE — Code Documentation (Cosign Needed)
[facility]/Trauma Progress Note  2 Days Post-Op   Subjective: CC: No complaints  Patient is perseverating on wanting to go home. Denies pain.  Vital signs are stable. Voiding. Up to chair yesterday and worked with PT.  Nurse reports patient will not keep eye patch in place.   Objective: Vital signs in last 24 hours: Temp:  [97.6 F (36.4 C)-98.4 F (36.9 C)] 98.3 F (36.8 C) (01/30 0300) Pulse Rate:  [80-104] 88 (01/30 0000) Resp:  [12-28] 17 (01/29 2000) BP: (113-161)/(54-76) 161/76 (01/30 0300) SpO2:  [94 %-100 %] 99 % (01/30 0300) Last BM Date: 02/09/18  Intake/Output from previous day: 01/29 0701 - 01/30 0700 In: 597 [P.O.:350; I.V.:247] Out: -  Intake/Output this shift: No intake/output data recorded.  PE: Gen: Alert, confused, pleasant, requires redirection HEENT: Healing lacerations and bruising to forehead and left face and orbital area; Right pupil 17mm, round and reactive to light, left pupil irregular, non reactive to light, sclera red Card:  RRR, no M/G/R heard, 2 + radial and pedal pulses bilaterally Pulm:  CTA, no W/R/R, effort normal Abd: Soft, NT/ND, +BS Skin: no rashes noted, warm and dry Extremities: MAE to command, mild, non-pitting edema to BLE Neuro: Confused, alert to self, follows intermittent commands  Anti-infectives: Anti-infectives (From admission, onward)   Start     Dose/Rate Route Frequency Ordered Stop   02/09/18 1816  vancomycin [facility]) intravitreal injection  Status:  Discontinued       As needed 02/09/18 1816 02/09/18 1823   02/09/18 1815  cefTAZidime (FORTAZ) intravitreal injection  Status:  Discontinued       As needed 02/09/18 1815 02/09/18 1823   02/09/18 1600  vancomycin (VANCOCIN) intravitreal injection 1 mg  Status:  Discontinued     1 mg Intravitreal To Surgery 02/09/18 1552 02/09/18 1949   02/09/18 1600  cefTAZidime (FORTAZ) intravitreal injection 2.25 mg  Status:  Discontinued     2.25 mg Intravitreal To  Surgery 02/09/18 1552 02/09/18 1949      Lab Results:  Recent Labs    02/09/18 1033 02/11/18 0434  WBC 6.6 7.9  HGB 10.4* 9.5*  HCT 33.3* 30.2*  PLT 399 375   BMET Recent Labs    02/09/18 1033 02/11/18 0434  NA 139 139  K 3.4* 4.0  CL 107 108  CO2 23 25  GLUCOSE 94 102*  BUN 10 18  CREATININE 0.72 1.03*  CALCIUM 9.3 8.8*   PT/INR No results for input(s): LABPROT, INR in the last 72 hours. CMP     Component Value Date/Time   NA 139 02/11/2018 0434   K 4.0 02/11/2018 0434   CL 108 02/11/2018 0434   CO2 25 02/11/2018 0434   GLUCOSE 102 (H) 02/11/2018 0434   BUN 18 02/11/2018 0434   CREATININE 1.03 (H) 02/11/2018 0434   CALCIUM 8.8 (L) 02/11/2018 0434   GFRNONAA 48 (L) 02/11/2018 0434   GFRAA 56 (L) 02/11/2018 0434   Lipase  No results found for: LIPASE  Studies/Results: Ct Head Wo Contrast  Result Date: 02/09/2018 CLINICAL DATA:  Followup intracranial hemorrhage EXAM: CT HEAD WITHOUT CONTRAST TECHNIQUE: Contiguous axial images were obtained from the base of the skull through the vertex without intravenous contrast. COMPARISON:  02/08/2018 FINDINGS: The study suffers from some motion degradation. Brain: Generalized atrophy as seen previously. The brainstem and cerebellum are normal. Chronic small-vessel ischemic changes of the cerebral hemispheres as seen previously. Small hemorrhagic contusions at the left frontal vertex have not increased. Large contusion  measures 1 cm in size. Mild regional edema. Small amount of post traumatic subarachnoid hemorrhage, but coming less apparent. No evidence of increased bleeding. No mass effect or shift. No extra-axial collection. Vascular: There is atherosclerotic calcification of the major vessels at the base of the brain. Skull: No skull fracture. Sinuses/Orbits: Left orbital/globe soft tissue injury as noted previously. Sinuses are clear. Other: None IMPRESSION: No evidence of increased bleeding. Small hemorrhagic contusions at the  left frontal vertex have not enlarged. Small amount of post traumatic subarachnoid hemorrhage, less apparent. No mass effect or shift. Left orbital and globe injury as previously described. Electronically Signed   By: Paulina Fusi M.D.   On: 02/09/2018 08:43    Assessment/Plan Fall with TBI Dementia: No medications PTA. Relocated to memory care unit on 01/26/2018.  History of falls: Neighbor reports h/o fall last year with right wrist injury. Hypertension: H/o htn on no medications PTA. PRN antihypertensives ordered but not required.   TBI: CT head on 02/08/2018 showed small hemorrhagic contusions in the left frontal lobe and scattered small volume subarachnoid hemorrhage. F/U head CT on 02/09/2018 with stable hemorrhagic contusions and improved subarachnoid hemorrhage. NS following.  Left globe injury: S/p globe rupture repair with injection of intravitreal abx 02/09/18 by Dr. Allena Katz. Erythromycin ointment to left eye q8h.  Anemia: Hgb 9.5 and Hct 30.2, stable Bilateral lower extremity edema: BLE swollen, R>L on arrival. Neighbor reports this is new since relocation.[place] negative, ECHO showing >65% EF, normal systolic function and impaired relaxation pattern of diastolic filling, trivial MV regurgitation. Swelling improved.   XLK:GMWNUUVOZ 2, thin liquid diet started yesterday. Cr up to 1.03 today. Nurse reports patient is not eating or drinking well. Increased IVF to 64ml/hr. Repeat BMET tomorrow.  VTE: SCD's; Hold chemical prophylaxis given TBI. DG:[id number] Follow OV:FIEPPIRJJOAC, Ophthalmology  DISPO: Therapies recommending SNF. Guardianship pending. Discharge pending.  LOS: 3 days   Mike Gip 02/11/2018, 7:45 AM  Consults: [phone]

## 2018-02-11 NOTE — Progress Notes (Signed)
Patient ID: Natalie Spencer, female   DOB: August 13, 1926, 85 y.o.   MRN: [id number] [facility] Surgery/Trauma Progress Note  2 Days Post-Op   Subjective: CC: No complaints  Patient is perseverating on wanting to go home. Denies pain.  Vital signs are stable. Voiding. Up to chair yesterday and worked with PT.  Nurse reports patient will not keep eye patch in place.   Objective: Vital signs in last 24 hours: Temp:  [97.6 F (36.4 C)-98.4 F (36.9 C)] 98.3 F (36.8 C) (01/30 0300) Pulse Rate:  [80-104] 88 (01/30 0000) Resp:  [12-28] 17 (01/29 2000) BP: (113-161)/(54-76) 161/76 (01/30 0300) SpO2:  [94 %-100 %] 99 % (01/30 0300) Last BM Date: 02/09/18  Intake/Output from previous day: 01/29 0701 - 01/30 0700 In: 597 [P.O.:350; I.V.:247] Out: -  Intake/Output this shift: No intake/output data recorded.  PE: Gen: Alert, confused, pleasant, requires redirection HEENT: Healing lacerations and bruising to forehead and left face and orbital area; Right pupil 40mm, round and reactive to light, left pupil irregular, non reactive to light, sclera red Card:  RRR, no M/G/R heard, 2 + radial and pedal pulses bilaterally Pulm:  CTA, no W/R/R, effort normal Abd: Soft, NT/ND, +BS Skin: no rashes noted, warm and dry Extremities: MAE to command, mild, non-pitting edema to BLE Neuro: Confused, alert to self, follows intermittent commands  Anti-infectives:            Anti-infectives (From admission, onward)   Start     Dose/Rate Route Frequency Ordered Stop   02/09/18 1816  vancomycin [facility]) intravitreal injection  Status:  Discontinued       As needed 02/09/18 1816 02/09/18 1823   02/09/18 1815  cefTAZidime (FORTAZ) intravitreal injection  Status:  Discontinued       As needed 02/09/18 1815 02/09/18 1823   02/09/18 1600  vancomycin (VANCOCIN) intravitreal injection 1 mg  Status:  Discontinued     1 mg Intravitreal To Surgery 02/09/18 1552 02/09/18 1949   02/09/18 1600   cefTAZidime (FORTAZ) intravitreal injection 2.25 mg  Status:  Discontinued     2.25 mg Intravitreal To Surgery 02/09/18 1552 02/09/18 1949      Lab Results:  RecentLabs(last2labs)      Recent Labs    02/09/18 1033 02/11/18 0434  WBC 6.6 7.9  HGB 10.4* 9.5*  HCT 33.3* 30.2*  PLT 399 375     BMET RecentLabs(last2labs)  Recent Labs    02/09/18 1033 02/11/18 0434  NA 139 139  K 3.4* 4.0  CL 107 108  CO2 23 25  GLUCOSE 94 102*  BUN 10 18  CREATININE 0.72 1.03*  CALCIUM 9.3 8.8*     PT/INR RecentLabs(last2labs)  No results for input(s): LABPROT, INR in the last 72 hours.   CMP     Labs(Brief)          Component Value Date/Time   NA 139 02/11/2018 0434   K 4.0 02/11/2018 0434   CL 108 02/11/2018 0434   CO2 25 02/11/2018 0434   GLUCOSE 102 (H) 02/11/2018 0434   BUN 18 02/11/2018 0434   CREATININE 1.03 (H) 02/11/2018 0434   CALCIUM 8.8 (L) 02/11/2018 0434   GFRNONAA 48 (L) 02/11/2018 0434   GFRAA 56 (L) 02/11/2018 0434     Lipase  Labs(Brief)  No results found for: LIPASE    Studies/Results:  ImagingResults(Last48hours)  Ct Head Wo Contrast  Result Date: 02/09/2018 CLINICAL DATA:  Followup intracranial hemorrhage EXAM: CT HEAD WITHOUT CONTRAST TECHNIQUE: Contiguous axial images  were obtained from the base of the skull through the vertex without intravenous contrast. COMPARISON:  02/08/2018 FINDINGS: The study suffers from some motion degradation. Brain: Generalized atrophy as seen previously. The brainstem and cerebellum are normal. Chronic small-vessel ischemic changes of the cerebral hemispheres as seen previously. Small hemorrhagic contusions at the left frontal vertex have not increased. Large contusion measures 1 cm in size. Mild regional edema. Small amount of post traumatic subarachnoid hemorrhage, but coming less apparent. No evidence of increased bleeding. No mass effect or shift. No extra-axial collection.  Vascular: There is atherosclerotic calcification of the major vessels at the base of the brain. Skull: No skull fracture. Sinuses/Orbits: Left orbital/globe soft tissue injury as noted previously. Sinuses are clear. Other: None IMPRESSION: No evidence of increased bleeding. Small hemorrhagic contusions at the left frontal vertex have not enlarged. Small amount of post traumatic subarachnoid hemorrhage, less apparent. No mass effect or shift. Left orbital and globe injury as previously described. Electronically Signed   By: Paulina Fusi M.D.   On: 02/09/2018 08:43     Assessment/Plan Fall with TBI Dementia: No medications PTA. Relocated to memory care unit on 01/26/2018.  History of falls: Neighbor reports h/o fall last year with right wrist injury. Hypertension: H/o htn on no medications PTA. PRN antihypertensives ordered but not required.   TBI: CT head on 02/08/2018 showed small hemorrhagic contusions in the left frontal lobe and scattered small volume subarachnoid hemorrhage. F/U head CT on 02/09/2018 with stable hemorrhagic contusions and improved subarachnoid hemorrhage. NS following.  Left globe injury:S/p globe rupture repair with injection of intravitreal abx 02/09/18 by Dr. Allena Katz. Erythromycin ointment to left eye q8h.  Anemia: Hgb9.5 and Hct 30.2, stable Bilateral lower extremity edema: BLE swollen, R>L on arrival. Neighbor reports this is new since relocation.[place] negative, ECHO showing >65% EF, normal systolic function and impaired relaxation pattern of diastolic filling, trivial MV regurgitation. Swelling improved.   BSJ:[id number] 2, thin liquid diet started yesterday. Cr up to 1.03 today. Nurse reports patient is not eating or drinking well. Increased IVF to 97ml/hr. Repeat BMET tomorrow.  VTE: SCD's; Hold chemical prophylaxis given TBI. TM:[id number] Follow XN:[id number],[id number]  DISPO: Therapies recommending SNF. Guardianship pending. Discharge  pending.  LOS: 3 days   Mike Gip 02/11/2018, 7:45 AM  Consults: [phone]          Electronically signed by Mike Gip, RN at 02/11/2018 8:07 AM     ED to Hosp-Admission (Current) on 02/08/2018   Patient examined and I agree with the assessment and plan  [name], MD, MPH, FACS Trauma: [phone] General Surgery: [phone]  02/11/2018 2:56 PM

## 2018-02-11 NOTE — Care Management Important Message (Signed)
Important Message  Patient Details  Name: Natalie Spencer MRN: [id number] Date of Birth: [birth year]   Medicare Important Message Given:  Yes    Hulda Reddix 02/11/2018, 3:35 PM

## 2018-02-12 LAB — BASIC METABOLIC PANEL
Anion gap: 8 (ref 5–15)
BUN: 14 mg/dL (ref 8–23)
CO2: 24 mmol/L (ref 22–32)
Calcium: 9.1 mg/dL (ref 8.9–10.3)
Chloride: 108 mmol/L (ref 98–111)
Creatinine, Ser: 0.9 mg/dL (ref 0.44–1.00)
GFR calc Af Amer: >60 mL/min (ref >60)
GFR, EST NON AFRICAN AMERICAN: 57 mL/min — AB (ref >60)
Glucose, Bld: 93 mg/dL (ref 70–99)
Potassium: 3.9 mmol/L (ref 3.5–5.1)
Sodium: 140 mmol/L (ref 135–145)

## 2018-02-12 MED ORDER — DOCUSATE SODIUM 50 MG/5ML PO LIQD
100.0000 mg | Freq: Two times a day (BID) | ORAL | Status: DC
  Administered 2018-02-12 – 2018-02-15 (×5): 100 mg via ORAL
  Filled 2018-02-12 (×6): qty 10

## 2018-02-12 NOTE — Clinical Social Work Note (Signed)
Clinical Social Work Assessment  Patient Details  Name: Natalie Spencer MRN: [id number] Date of Birth: Mar 09, 1926  Date of referral:  02/12/18               Reason for consult:  Trauma, Facility Placement, Guardianship Needs                Permission sought to share information with:  Family Supports Permission granted to share information::  Yes, Verbal Permission Granted  Name::     [place]::     Relationship::  HCPOA  Contact Information:  [phone]  Housing/Transportation Living arrangements for the past 2 months:  Assisted Living Facility(Memory Care Unit) Source of Information:  Adult Children(Per RNCM - patient HCPOA has provided verbal consent to communicate with patient son) Patient Interpreter Needed:  None Criminal Activity/Legal Involvement Pertinent to Current Situation/Hospitalization:  No - Comment as needed Significant Relationships:  Adult Children, Friend Lives with:  Facility Resident Do you feel safe going back to the place where you live?  Yes Need for family participation in patient care:  Yes (Comment)  Care giving concerns:  Patient son verbalizes concerns regarding decision making on patient behalf.  He recognizes that he is not the HCPOA and has taken steps to petition for guardianship to be involved in patient care.   Social Worker assessment / plan:  Holiday representative and RNCM met with patient, patient son Elta [place] and his wife at beside to offer support and discuss patient potential plans at discharge.  Patient currently resides at [facility], however per therapies, patient will need SNF placement at discharge.  Patient son is aware that Janace Hoard is patient HCPOA, but he is not happy with the decisions being made on patient behalf.  Patient has hired IT sales professional Magazine features editor) to petition for guardianship.  Currently decision making falls to Angie who is patient HCPOA.  RNCM has completed [place] and [place], however we have not yet initiated SNF  search due to limitations with decision making and potential guardianship issues at hand.  CSW to initiate search and follow up with guardian once guardian is established.  CSW remains available for support and to assist in patient discharge planning.  Employment status:  Retired Forensic scientist:  Medicare PT Recommendations:  [place] / Referral to community resources:  [place]  Patient/Family's Response to care:  Patient son verbalized understanding of CSW role and appreciation for involvement.  Patient son made no mention of him attempting to take patient home at discharge and seemed to be on board with post acute placement.  Patient son awaiting confirmation about Guardian at [place] and further decision making possibilities.  Patient/Family's Understanding of and Emotional Response to Diagnosis, Current Treatment, and Prognosis:  Patient family understanding of patient limitations, but does not seem to grasp the full need for continued therapies and potential memory care needs.  Patient family/friends continue to work through decision making on patient behalf.  Emotional Assessment Appearance:  Appears stated age Attitude/Demeanor/Rapport:  Charismatic Affect (typically observed):  Calm, Happy Orientation:  Oriented to Self Alcohol / Substance use:  Not Applicable Psych involvement (Current and /or in the community):  No (Comment)  Discharge Needs  Concerns to be addressed:  Discharge Planning Concerns, Other (Comment Required(Guardianship - HCPOA concerns) Readmission within the last 30 days:  No Current discharge risk:  Physical Impairment, Lack of support system Barriers to Discharge:  Continued Medical Work up, Requiring sitter/restraints, Family Issues  Barbette Or, LCSW  [phone]

## 2018-02-12 NOTE — Progress Notes (Signed)
[facility] Progress Note  3 Days Post-Op  Subjective: CC: no complaints Patient resting this AM, per sitter was awake most of the night. RN reports patient agitation and restlessness improved with tylenol so may be pain related. Patient denies pain when I asked this morning. Able to tell me her name but disoriented to place/time/situation.   Objective: Vital signs in last 24 hours: Temp:  [98 F (36.7 C)-98.6 F (37 C)] 98 F (36.7 C) (01/31 0400) Pulse Rate:  [77-96] 77 (01/31 0400) Resp:  [13-22] 18 (01/31 0400) BP: (137-157)/(69-88) 143/71 (01/31 0800) SpO2:  [95 %-100 %] 95 % (01/31 0400) Last BM Date: 02/10/18  Intake/Output from previous day: 01/30 0701 - 01/31 0700 In: 1802.9 [P.O.:880; I.V.:922.9] Out: 501 [Urine:501] Intake/Output this shift: No intake/output data recorded.  PE: Gen:  Resting, NAD ENT: L eye swollen with ecchymosis Card:  Regular rate and rhythm Pulm:  Normal effort, clear to auscultation bilaterally Abd: Soft, non-tender, non-distended, bowel sounds present, no HSM Skin: warm and dry, no rashes   Lab Results:  Recent Labs    02/09/18 1033 02/11/18 0434  WBC 6.6 7.9  HGB 10.4* 9.5*  HCT 33.3* 30.2*  PLT 399 375   BMET Recent Labs    02/11/18 0434 02/12/18 0632  NA 139 140  K 4.0 3.9  CL 108 108  CO2 25 24  GLUCOSE 102* 93  BUN 18 14  CREATININE 1.03* 0.90  CALCIUM 8.8* 9.1   PT/INR No results for input(s): LABPROT, INR in the last 72 hours. CMP     Component Value Date/Time   NA 140 02/12/2018 0632   K 3.9 02/12/2018 0632   CL 108 02/12/2018 0632   CO2 24 02/12/2018 0632   GLUCOSE 93 02/12/2018 0632   BUN 14 02/12/2018 0632   CREATININE 0.90 02/12/2018 0632   CALCIUM 9.1 02/12/2018 0632   GFRNONAA 57 (L) 02/12/2018 0632   GFRAA >60 02/12/2018 0375   Lipase  No results found for: LIPASE     Studies/Results: No results found.  Anti-infectives: Anti-infectives (From admission, onward)   Start      Dose/Rate Route Frequency Ordered Stop   02/09/18 1816  vancomycin [facility]) intravitreal injection  Status:  Discontinued       As needed 02/09/18 1816 02/09/18 1823   02/09/18 1815  cefTAZidime (FORTAZ) intravitreal injection  Status:  Discontinued       As needed 02/09/18 1815 02/09/18 1823   02/09/18 1600  vancomycin (VANCOCIN) intravitreal injection 1 mg  Status:  Discontinued     1 mg Intravitreal To Surgery 02/09/18 1552 02/09/18 1949   02/09/18 1600  cefTAZidime (FORTAZ) intravitreal injection 2.25 mg  Status:  Discontinued     2.25 mg Intravitreal To Surgery 02/09/18 1552 02/09/18 1949       Assessment/Plan Fall with TBI Dementia: No medications PTA. Relocated to memory care unit on 01/26/2018.  History of falls: Neighbor reports h/o fall last year with right wrist injury. Hypertension: H/o htn on no medications PTA. PRN antihypertensives orderedbut not required.  TBI: CT head on 02/08/2018 showed small hemorrhagic contusions in the left frontal lobe and scattered small volume subarachnoid hemorrhage. F/U head CTon 02/09/2018 withstablehemorrhagic contusions and improved subarachnoid hemorrhage. NSfollowing. Left globe injury:S/pglobe rupture repair with injection of intravitreal abx 1/28/20byDr. Allena Katz. Erythromycin ointment to left eye q8h. Anemia: Hgb9.5 and Hct 30.2 on 1/30, stable Bilateral lower extremity edema: BLE swollen, R>Lon arrival. Enos Fling reports this is new since relocation.[place] negative, ECHOshowing >65%  EF, normal systolic function and impaired relaxation pattern of diastolic filling, trivial MV regurgitation. Swelling improved.  ZOX:WRUEAVWUJ 2, thin liquid diet.  VTE: SCD's; Hold chemical prophylaxis given TBI. WJ:XBJYN-WGNFAOZHY Follow QM:VHQIONGEXBMW,UXLKGMWNUUVOZ  DISPO:Therapies recommending SNF.Guardianship pending. Discharge pending.  LOS: 4 days    Wells Guiles , [facility] Surgery 02/12/2018, 8:06 AM Pager:  [phone]

## 2018-02-12 NOTE — Progress Notes (Signed)
Physical Therapy Treatment Patient Details Name: Natalie Spencer MRN: [id number] DOB: 04/19/1926 Today's Date: 02/12/2018    History of Present Illness 85 yo admitted after fall at memory care with left frontal intracerebral contusion and left globe rupture. PMhx: dementia, HTN    PT Comments    Pt supine with eyes closed on arrival. Pt able to speak and respond to therapist but when cued to open eyes pt with left eye open and not right. Then when eyes opened maintained upward gaze grossly 45 sec prior to able to focus and track with bil eyes, RN present and aware. Pt extremely pleasant and thrilled with staff in room this morning. Pt incontinent of urine on arrival and unaware but able to follow cues for mobility, perform selfcare with supervision at toilet and increase gait with less distraction today. Sitter present throughout session and pt feeding self end of session. Will continue to follow. Pt with chair alarm belt present and educated for how to remove with yellow loop. BP 143/71     Follow Up Recommendations  SNF;Supervision/Assistance - 24 hour     Equipment Recommendations  None recommended by PT    Recommendations for Other Services       Precautions / Restrictions Precautions Precautions: Fall    Mobility  Bed Mobility Overal bed mobility: Needs Assistance Bed Mobility: Supine to Sit     Supine to sit: Min guard     General bed mobility comments: cues for initiation and continued transfer to complete activity  Transfers Overall transfer level: Needs assistance   Transfers: Sit to/from Stand Sit to Stand: Min guard         General transfer comment: cues for hand placement with multimodal cues to rise  Ambulation/Gait Ambulation/Gait assistance: Min assist Gait Distance (Feet): 200 Feet Assistive device: Rolling walker (2 wheeled) Gait Pattern/deviations: Step-through pattern;Decreased stride length;Trunk flexed   Gait velocity interpretation: 1.31 - 2.62  ft/sec, indicative of limited community ambulator General Gait Details: pt with improved gait this session with cues for direction and way finding as well as grasp on RW and safety with min assist to turn RW    Stairs             Wheelchair Mobility    Modified Rankin (Stroke Patients Only)       Balance Overall balance assessment: Needs assistance Sitting-balance support: Feet supported;No upper extremity supported Sitting balance-Leahy Scale: Good     Standing balance support: Bilateral upper extremity supported Standing balance-Leahy Scale: Fair Standing balance comment: able to wash hands at sink without UE support, rW for gait                            Cognition Arousal/Alertness: Awake/alert Behavior During Therapy: Flat affect Overall Cognitive Status: History of cognitive impairments - at baseline Area of Impairment: Orientation;Attention;Memory;Problem solving;Safety/judgement                 Orientation Level: Disoriented to;Place;Time;Situation Current Attention Level: Sustained Memory: Decreased short-term memory Following Commands: Follows one step commands with increased time Safety/Judgement: Decreased awareness of safety;Decreased awareness of deficits   Problem Solving: Slow processing;Decreased initiation;Difficulty sequencing;Requires verbal cues;Requires tactile cues        Exercises      General Comments        Pertinent Vitals/Pain Pain Assessment: No/denies pain    Home Living  Prior Function            PT Goals (current goals can now be found in the care plan section) Progress towards PT goals: Progressing toward goals    Frequency    Min 2X/week      PT Plan Current plan remains appropriate;Frequency needs to be updated    Co-evaluation              AM-PAC PT "6 Clicks" Mobility   Outcome Measure  Help needed turning from your back to your side while in a flat  bed without using bedrails?: A Little Help needed moving from lying on your back to sitting on the side of a flat bed without using bedrails?: A Little Help needed moving to and from a bed to a chair (including a wheelchair)?: A Little Help needed standing up from a chair using your arms (e.g., wheelchair or bedside chair)?: A Little Help needed to walk in hospital room?: A Little Help needed climbing 3-5 steps with a railing? : A Lot 6 Click Score: 17    End of Session Equipment Utilized During Treatment: Gait belt Activity Tolerance: Patient tolerated treatment well Patient left: in chair;with call bell/phone within reach;with chair alarm set;with nursing/sitter in room Nurse Communication: Mobility status PT Visit Diagnosis: Other abnormalities of gait and mobility (R26.89);Unsteadiness on feet (R26.81);History of falling (Z91.81)     Time: 6606-3016 PT Time Calculation (min) (ACUTE ONLY): 24 min  Charges:  $Gait Training: 8-22 mins $Therapeutic Activity: 8-22 mins                     Bali Lyn Abner Greenspan, PT Acute Rehabilitation Services Pager: [phone] Office: [phone]    Enedina Finner Juelz Whittenberg 02/12/2018, 8:39 AM

## 2018-02-13 LAB — BASIC METABOLIC PANEL
Anion gap: 8 (ref 5–15)
BUN: 16 mg/dL (ref 8–23)
CO2: 25 mmol/L (ref 22–32)
Calcium: 9.4 mg/dL (ref 8.9–10.3)
Chloride: 106 mmol/L (ref 98–111)
Creatinine, Ser: 1 mg/dL (ref 0.44–1.00)
GFR, EST AFRICAN AMERICAN: 58 mL/min — AB (ref >60)
GFR, EST NON AFRICAN AMERICAN: 50 mL/min — AB (ref >60)
Glucose, Bld: 110 mg/dL — ABNORMAL HIGH (ref 70–99)
Potassium: 4.1 mmol/L (ref 3.5–5.1)
Sodium: 139 mmol/L (ref 135–145)

## 2018-02-13 NOTE — Progress Notes (Addendum)
[facility] Progress Note  4 Days Post-Op   Assessment/Plan Fall with TBI Dementia: No medications PTA. Relocated to memory care unit on 01/26/2018.  History of falls Hypertension: no home meds. PRN antihypertensives   TBI: F/U head CT01/28 withstablehemorrhagic contusions and improved subarachnoid hemorrhage. NSfollowing. Left globe injury:S/pglobe rupture repair with injection of intravitreal abx 1/28/20byDr. Allena KatzPatel. Erythromycin ointment to left eye q8h. Anemia: Hgb9.5 and Hct 30.2 on 1/30, stable Bilateral lower extremity edema: BLE swollen, R>Lon arrival. Enos Flingeighbor reports this is new since relocation.US negative, ECHOshowing >65% EF, normal systolic function and impaired relaxation pattern of diastolic filling, trivial MV regurgitation. Swelling improved.  WGN:FAOZHYQMVFEN:Dysphagia 2, thin liquid diet.  VTE: SCD's; Hold chemical prophylaxis given TBI. HQ:IONGE-XBMWUXLKG:intra-operative Follow MW:NUUVOZDGUYQI,HKVQQVZDGLOVFup:Neurosurgery,Ophthalmology  DISPO:Therapies recommending SNF.Guardianship pending. Discharge pending.   LOS: 5 days    Subjective: CC: no complaints  Pt is confused and asking "what is going on". PT denies pain. She states she cannot see out of her L eye. Sitter at bedside.   Objective: Vital signs in last 24 hours: Temp:  [97.9 F (36.6 C)-98.9 F (37.2 C)] 97.9 F (36.6 C) (02/01 0805) Pulse Rate:  [62-84] 69 (02/01 0805) Resp:  [16-20] 19 (02/01 0805) BP: (126-165)/(63-100) 126/100 (02/01 0805) SpO2:  [90 %-100 %] 90 % (02/01 0805) Last BM Date: 02/10/18  Intake/Output from previous day: 01/31 0701 - 02/01 0700 In: 1141.1 [P.O.:720; I.V.:421.1] Out: 1050 [Urine:1050] Intake/Output this shift: No intake/output data recorded.  PE: Gen:  Alert, NAD, pleasant, cooperative HEENT: conjunctival hemorrhage and edema to L eye Card:  RRR, no M/G/R heard, 2 + radial and DP pulses bilaterally Pulm:  CTA, no W/R/R, rate and effort normal Abd: Soft, NT/ND,  +BS Extremities: mild edema of RLE, no redness or TTP to calves b/l Skin: no rashes noted, warm and dry   Anti-infectives: Anti-infectives (From admission, onward)   Start     Dose/Rate Route Frequency Ordered Stop   02/09/18 1816  vancomycin (VANCOCIN) intravitreal injection  Status:  Discontinued       As needed 02/09/18 1816 02/09/18 1823   02/09/18 1815  cefTAZidime (FORTAZ) intravitreal injection  Status:  Discontinued       As needed 02/09/18 1815 02/09/18 1823   02/09/18 1600  vancomycin (VANCOCIN) intravitreal injection 1 mg  Status:  Discontinued     1 mg Intravitreal To Surgery 02/09/18 1552 02/09/18 1949   02/09/18 1600  cefTAZidime (FORTAZ) intravitreal injection 2.25 mg  Status:  Discontinued     2.25 mg Intravitreal To Surgery 02/09/18 1552 02/09/18 1949      Lab Results:  Recent Labs    02/11/18 0434  WBC 7.9  HGB 9.5*  HCT 30.2*  PLT 375   BMET Recent Labs    02/12/18 0632 02/13/18 0639  NA 140 139  K 3.9 4.1  CL 108 106  CO2 24 25  GLUCOSE 93 110*  BUN 14 16  CREATININE 0.90 1.00  CALCIUM 9.1 9.4   PT/INR No results for input(s): LABPROT, INR in the last 72 hours. CMP     Component Value Date/Time   NA 139 02/13/2018 0639   K 4.1 02/13/2018 0639   CL 106 02/13/2018 0639   CO2 25 02/13/2018 0639   GLUCOSE 110 (H) 02/13/2018 0639   BUN 16 02/13/2018 0639   CREATININE 1.00 02/13/2018 0639   CALCIUM 9.4 02/13/2018 0639   GFRNONAA 50 (L) 02/13/2018 0639   GFRAA 58 (L) 02/13/2018 0639   Lipase  No results found  for: LIPASE  Studies/Results: No results found.    Jerre Simon , [facility] Surgery 02/13/2018, 9:24 AM  Pager: [phone] Mon-Wed, Friday 7:00am-4:30pm Thurs 7am-11:30am  Consults: [phone]

## 2018-02-14 NOTE — Progress Notes (Signed)
[facility]/Trauma Progress Note  5 Days Post-Op   Assessment/Plan Fall with TBI Dementia: No medications PTA. Relocated to memory care unit on 01/26/2018.  History of falls Hypertension: no home meds. PRN antihypertensives   TBI: F/U head CT01/28 withstablehemorrhagic contusions and improved subarachnoid hemorrhage. NSfollowing. Left globe injury:S/pglobe rupture repair with injection of intravitreal abx 1/28/20byDr. Allena Katz. Erythromycin ointment to left eye q8h. Anemia: Hgb9.5 and Hct 30.2on 1/30, stable Bilateral lower extremity edema: BLE swollen, R>Lon arrival. Enos Fling reports this is new since relocation.[place] negative, ECHOshowing >65% EF, normal systolic function and impaired relaxation pattern of diastolic filling, trivial MV regurgitation. Swelling improved.  BWL:SLHTDSKAJ 2, thin liquid diet.  VTE: SCD's; Hold chemical prophylaxis given TBI. GO:TLXBW-IOMBTDHRC Follow BU:LAGTXMIWOEHO,ZYYQMGNOIBBCW  DISPO:Therapies recommending SNF.Guardianship pending. Discharge pending.   LOS: 6 days    Subjective: CC: no complaints  Pt wants to go home. Pleasantly demented. States she can see shadows out of her L eye.   Objective: Vital signs in last 24 hours: Temp:  [97.6 F (36.4 C)-99.1 F (37.3 C)] 98.7 F (37.1 C) (02/02 0826) Pulse Rate:  [69-88] 88 (02/02 0826) Resp:  [13-24] 21 (02/02 0826) BP: (118-156)/(62-100) 156/75 (02/02 0826) SpO2:  [94 %-100 %] 96 % (02/02 0826) Last BM Date: 02/10/18  Intake/Output from previous day: 02/01 0701 - 02/02 0700 In: 410.2 [P.O.:320; I.V.:90.2] Out: 1375 [Urine:1375] Intake/Output this shift: No intake/output data recorded.  PE: Gen:  Alert, NAD, pleasant, cooperative HEENT: conjunctival hemorrhage and edema to L eye Card:  RRR, no M/G/R heard Pulm:  CTA, no W/R/R, rate and effort normal Abd: Soft, NT/ND, +BS Skin: no rashes noted, warm and dry    Anti-infectives: Anti-infectives  (From admission, onward)   Start     Dose/Rate Route Frequency Ordered Stop   02/09/18 1816  vancomycin (VANCOCIN) intravitreal injection  Status:  Discontinued       As needed 02/09/18 1816 02/09/18 1823   02/09/18 1815  cefTAZidime (FORTAZ) intravitreal injection  Status:  Discontinued       As needed 02/09/18 1815 02/09/18 1823   02/09/18 1600  vancomycin (VANCOCIN) intravitreal injection 1 mg  Status:  Discontinued     1 mg Intravitreal To Surgery 02/09/18 1552 02/09/18 1949   02/09/18 1600  cefTAZidime (FORTAZ) intravitreal injection 2.25 mg  Status:  Discontinued     2.25 mg Intravitreal To Surgery 02/09/18 1552 02/09/18 1949      Lab Results:  No results for input(s): WBC, HGB, HCT, PLT in the last 72 hours. BMET Recent Labs    02/12/18 0632 02/13/18 0639  NA 140 139  K 3.9 4.1  CL 108 106  CO2 24 25  GLUCOSE 93 110*  BUN 14 16  CREATININE 0.90 1.00  CALCIUM 9.1 9.4   PT/INR No results for input(s): LABPROT, INR in the last 72 hours. CMP     Component Value Date/Time   NA 139 02/13/2018 0639   K 4.1 02/13/2018 0639   CL 106 02/13/2018 0639   CO2 25 02/13/2018 0639   GLUCOSE 110 (H) 02/13/2018 0639   BUN 16 02/13/2018 0639   CREATININE 1.00 02/13/2018 0639   CALCIUM 9.4 02/13/2018 0639   GFRNONAA 50 (L) 02/13/2018 0639   GFRAA 58 (L) 02/13/2018 0639   Lipase  No results found for: LIPASE  Studies/Results: No results found.    Jerre Simon , [facility] Surgery 02/14/2018, 10:27 AM  Pager: [phone] Mon-Wed, Friday 7:00am-4:30pm Thurs 7am-11:30am  Consults: [phone]

## 2018-02-14 NOTE — Progress Notes (Signed)
Dr. Janee Morn spoke with pt's son regarding questions and concern about her care. Son was concern with patient falling twice in 6 hrs prior to hospitalization. All concerns were addressed and son satisfied.

## 2018-02-14 NOTE — Progress Notes (Signed)
Patient ID: Natalie Spencer, female   DOB: 08-Jul-1926, 85 y.o.   MRN: [id number] RN notified me her son at the bedside had some questions. I spoke with him and reviewed her situation and the plan of care. He reports the guardian ad litem will be assigned Wednesday.  [name], MD, MPH, FACS Trauma: [phone] General Surgery: [phone]

## 2018-02-15 MED ORDER — ERYTHROMYCIN 5 MG/GM OP OINT: TOPICAL_OINTMENT | Freq: Three times a day (TID) | OPHTHALMIC | 0 refills | Status: DC

## 2018-02-15 MED ORDER — ENOXAPARIN SODIUM 40 MG/0.4ML ~~LOC~~ SOLN
40.0000 mg | SUBCUTANEOUS | Status: DC
  Administered 2018-02-15 – 2018-02-16 (×2): 40 mg via SUBCUTANEOUS
  Filled 2018-02-15 (×2): qty 0.4

## 2018-02-15 MED ORDER — ACETAMINOPHEN 325 MG PO TABS: 650.0000 mg | ORAL_TABLET | ORAL | Status: DC | PRN

## 2018-02-15 MED ORDER — TRAMADOL HCL 50 MG PO TABS: 50.0000 mg | ORAL_TABLET | Freq: Four times a day (QID) | ORAL | 0 refills | Status: DC | PRN

## 2018-02-15 NOTE — Progress Notes (Signed)
[facility] that [facility] and patient son are unable to complete SNF paperwork today, and will schedule a time tomorrow 2/4 for son to complete paperwork. Patient can not admit to facility until paperwork completed.   [facility]   ElderonAshley Mareon Robinette, [place] [phone]

## 2018-02-15 NOTE — Discharge Summary (Addendum)
Patient ID: Natalie Spencer [id number] 10-Nov-1926 85 y.o.  Admit date: 02/08/2018 Discharge date: 02/16/2018  Admitting Diagnosis: Fall TBI Rupture of left eye globe Dementia  BLE edema  Discharge Diagnosis Patient Active Problem List   Diagnosis Date Noted  . TBI (traumatic brain injury) (HCC) 02/08/2018  . Injury of globe of left eye 02/08/2018  . Contusion of right middle finger 02/08/2018  . Fall 02/08/2018  . Dementia (HCC) 02/08/2018  . Anemia 02/08/2018  . Bilateral lower extremity edema 02/08/2018    Consultants Dr. Allena Katz, ophthalmology Dr. Lovell Sheehan, NS   Reason for Admission: 85yo female with history of dementia who recently relocated from home to [facility] on 01/26/2018. Of note, patient's husband recently passed away this month. Patient was found on the floor by staff this morning and EMS was called and transported to [facility]. Patient noted to have facial trauma. Maxillofacial CT showing left orbital trauma with left globe rupture. CT head showed small hemorrhagic contusions in the left frontal lobe and scattered small volume subarachnoid hemorrhage. CT showed no evidence of acute maxillofacial or cervical spine fractures. Patient remains hemodynamically stable in ED resuscitation. Trauma service called for admission.   Procedures Globe rupture repair by Dr. Allena Katz on 02/09/18  Hospital Course:  Left eye globe rupture Ophthalmology was consulted and the patient was taken to the OR on 1/28 for repair of this rupture.  She tolerated the procedure well.  She had erythromycin ointment placed on her eye BID post operatively.  She was supposed to wear and eye shield, but due to her dementia, she would not keep it on.  No other issues were noted regarding this during her stay.  TBI This was secondary to her fall.  She was noted to have a small hemorrhagic contusion in the left frontal lobe and scattered small volume SAH.  NS was consulted and no intervention  warranted.  Follow up CT was stable.  No further deficits were noted from her normal baseline dementia.    BLE edema This was noted on admission with unclear etiology.  Duplex was ordered and was negative.  Echo was also ordered with no evidence for cause of her edema.  This seemed to improve while here.  No further work up or etiology discovered.  She can follow up with her PCP as an outpatient if this recurs.    PT/OT was ordered for this patient.  SNF was recommended.  After several family issues were sorted out a guardian ad litem was assigned to this case.  On HD 8, a SNF was found and permission was granted for discharge.  The patient was otherwise stable during her admission and was stable for DC home with appropriate follow up made.   Physical Exam: Gen: NAD, demented HEENT: injection of sclera as expected, pupils are also unequal as expected, some old yellow bruising on the left side of her face Heart: regular Lungs: CTAB Abd: soft, TN, ND Ext: MAE  Allergies as of 02/15/2018   No Known Allergies     Medication List    TAKE these medications   acetaminophen 325 MG tablet Commonly known as:  TYLENOL Take 2 tablets (650 mg total) by mouth every 4 (four) hours as needed for mild pain.   erythromycin ophthalmic ointment Place into the left eye every 8 (eight) hours.   traMADol 50 MG tablet Commonly known as:  ULTRAM Take 1 tablet (50 mg total) by mouth every 6 (six) hours as needed  for moderate pain.        Follow-up Information    Carmela RimaPatel, Narendra, MD In 1 week.   Specialty:  Ophthalmology Why:  For wound re-check Contact information: [address] [place] [address] [phone]        Tressie StalkerJenkins, Jeffrey, MD Follow up.   Specialty:  Neurosurgery Contact information: [address] [place] [address] [phone]        [facility] Follow up.   Why:  No follow up scheduled. Call as needed.  Contact  information: [address] [place] [id number] [phone]          Signed: Barnetta ChapelKelly Sayler Mickiewicz, [facility] 02/15/2018, 2:40 PM Pager: [phone]

## 2018-02-15 NOTE — Progress Notes (Signed)
Patient ID: Natalie Spencer, female   DOB: 1926-11-20, 85 y.o.   MRN: [id number]    6 Days Post-Op  Subjective: No complaints.  Alert and oriented to self, but that's it.  Eating her breakfast with the assist of an aid.  No complaints.  Objective: Vital signs in last 24 hours: Temp:  [97.7 F (36.5 C)-98.9 F (37.2 C)] 98.7 F (37.1 C) (02/03 0300) Pulse Rate:  [71-89] 72 (02/03 0300) Resp:  [16-21] 21 (02/03 0300) BP: (145-157)/(60-75) 157/63 (02/03 0300) SpO2:  [94 %-100 %] 94 % (02/03 0300) Last BM Date: 02/10/18  Intake/Output from previous day: 02/02 0701 - 02/03 0700 In: 960 [P.O.:960] Out: -  Intake/Output this shift: No intake/output data recorded.  PE: Gen: NAD HEENT: face with healing facial ecchymosis.  Edema around eye is much better.  Pupils are unequal as expected.  Patient states she is unable to see out of injured eye Heart: regular Lungs: CTAB Abd: soft, NT, ND, +BS  Lab Results:  No results for input(s): WBC, HGB, HCT, PLT in the last 72 hours. BMET Recent Labs    02/13/18 0639  NA 139  K 4.1  CL 106  CO2 25  GLUCOSE 110*  BUN 16  CREATININE 1.00  CALCIUM 9.4   PT/INR No results for input(s): LABPROT, INR in the last 72 hours. CMP     Component Value Date/Time   NA 139 02/13/2018 0639   K 4.1 02/13/2018 0639   CL 106 02/13/2018 0639   CO2 25 02/13/2018 0639   GLUCOSE 110 (H) 02/13/2018 0639   BUN 16 02/13/2018 0639   CREATININE 1.00 02/13/2018 0639   CALCIUM 9.4 02/13/2018 0639   GFRNONAA 50 (L) 02/13/2018 0639   GFRAA 58 (L) 02/13/2018 0639   Lipase  No results found for: LIPASE     Studies/Results: No results found.  Anti-infectives: Anti-infectives (From admission, onward)   Start     Dose/Rate Route Frequency Ordered Stop   02/09/18 1816  vancomycin [facility]) intravitreal injection  Status:  Discontinued       As needed 02/09/18 1816 02/09/18 1823   02/09/18 1815  cefTAZidime (FORTAZ) intravitreal injection  Status:   Discontinued       As needed 02/09/18 1815 02/09/18 1823   02/09/18 1600  vancomycin (VANCOCIN) intravitreal injection 1 mg  Status:  Discontinued     1 mg Intravitreal To Surgery 02/09/18 1552 02/09/18 1949   02/09/18 1600  cefTAZidime (FORTAZ) intravitreal injection 2.25 mg  Status:  Discontinued     2.25 mg Intravitreal To Surgery 02/09/18 1552 02/09/18 1949       Assessment/Plan Fall with TBI Dementia: No medications PTA. Relocated to memory care unit on 01/26/2018.  History of falls Hypertension:no home meds. PRN antihypertensives   TBI: F/U head CT01/28 withstablehemorrhagic contusions and improved subarachnoid hemorrhage. NSfollowing. Left globe injury:S/pglobe rupture repair with injection of intravitreal abx 1/28/20byDr. Allena Katz. Erythromycin ointment to left eye q8h. Anemia: stable Bilateral lower extremity edema: work up reveals no definitive cause. Swelling improved.  BTY:OMAYOKHTX 2, thin liquid diet.  VTE: SCD's; Hold chemical prophylaxis given TBI, but given duration suspec this can be initiated ID:E-mycin eye drops Follow HF:[id number]  DISPO:Therapies recommending SNF.Guardianship pending. Discharge pending.   LOS: 7 days    Letha Cape , [facility] 02/15/2018, 8:16 AM Pager: [phone]

## 2018-02-15 NOTE — Care Management Note (Signed)
Case Management Note  Patient Details  Name: Natalie Spencer MRN: [id number] Date of Birth: 12/04/1926  Subjective/Objective:  85 yo admitted after fall at memory care with left frontal intracerebral contusion and left globe rupture.  [facility]                  Action/Plan: Pt to OR for Lt eye surgery today.  Pt's neighbor, Karoline Caldwellngie is POA, and has brought in documentation of this.  Pt's adopted son is upset that he is not the decision maker for pt.    Expected Discharge Date:  02/15/18               Expected Discharge Plan:  Skilled Nursing Facility  In-House Referral:  Clinical Social Work  Discharge planning Services  CM Consult  Post Acute Care Choice:    Choice offered to:     DME Arranged:    DME Agency:     HH Arranged:    HH Agency:     Status of Service:  Completed, signed off  If discussed at [employer] Meetings, dates discussed:    Additional Comments:  02/15/2018 J. Neldon Shepard, RN, BSN Pt medically stable for discharge today.  CSW has spoken with pt's guardian ad litem, and SNF bed chosen.  [facility], per CSW arrangements.   Quintella BatonJulie W. Mickie Badders, RN, BSN  Trauma/Neuro ICU Case Manager [phone]

## 2018-02-15 NOTE — Progress Notes (Signed)
CSW contacted patient's son Loraine Leriche who reports he has petitioned for guardianship over patient but the court date is 2/11, as of right now the GAL (guardian ad litem) will be the decision maker until the court date. Loraine Leriche reported his preference for patient to attend [facility] skilled nursing. CSW spoke with GAL Brett Canales who reports he is in agreement with [facility] and confirmed he is the decision maker at this time until the court date.   CSW received permission from GAL to send referral to [facility]. Awaiting bed offer at this time.   [place], [place] [phone]

## 2018-02-15 NOTE — NC FL2 (Signed)
[place] MEDICAID FL2 LEVEL OF CARE SCREENING TOOL     IDENTIFICATION  Patient Name: Natalie Spencer Birthdate: [birth year] Sex: female Admission Date (Current Location): 02/08/2018  [facility] and [place] Number:  Producer, television/film/video and Address:  [facility], [address], [place], [place] [address]      Provider Number: [id number]  Attending Physician Name and Address:  Md, Trauma, MD  Relative Name and Phone Number:  Loraine Leriche [phone]    Current Level of Care: Hospital Recommended Level of Care: Skilled Nursing Facility Prior Approval Number:    Date Approved/Denied:   PASRR Number: [id number]  Discharge Plan: SNF    Current Diagnoses: Patient Active Problem List   Diagnosis Date Noted  . TBI (traumatic brain injury) (HCC) 02/08/2018  . Injury of globe of left eye 02/08/2018  . Contusion of right middle finger 02/08/2018  . Fall 02/08/2018  . Dementia (HCC) 02/08/2018  . Anemia 02/08/2018  . Bilateral lower extremity edema 02/08/2018    Orientation RESPIRATION BLADDER Height & Weight     Self  Normal Continent Weight:   Height:     BEHAVIORAL SYMPTOMS/MOOD NEUROLOGICAL BOWEL NUTRITION STATUS      Continent Diet  AMBULATORY STATUS COMMUNICATION OF NEEDS Skin   Limited Assist Verbally Skin abrasions, Bruising(Abrasions and bruising to Lt face)                       Personal Care Assistance Level of Assistance  Bathing, Feeding, Dressing Bathing Assistance: Limited assistance Feeding assistance: Limited assistance Dressing Assistance: Limited assistance     Functional Limitations Info  (Lt globe rupture) Sight Info: Impaired        SPECIAL CARE FACTORS FREQUENCY  PT (By licensed PT), OT (By licensed OT)     PT Frequency: 5 times/week OT Frequency: 5 times/week            Contractures Contractures Info: Not present    Additional Factors Info  Code Status, Allergies Code Status Info: Full Allergies Info: No  Known allergies           Current Medications (02/15/2018):  This is the current hospital active medication list Current Facility-Administered Medications  Medication Dose Route Frequency Provider Last Rate Last Dose  . 0.9 %  sodium chloride infusion   Intravenous Continuous Rayburn, Kelly A, PA-C   Stopped at 02/13/18 1549  . acetaminophen (TYLENOL) tablet 650 mg  650 mg Oral Q4H PRN Rayburn, Kelly A, PA-C   650 mg at 02/13/18 0540  . docusate (COLACE) 50 MG/5ML liquid 100 mg  100 mg Oral BID [name], MD   100 mg at 02/15/18 0909  . enoxaparin (LOVENOX) injection 40 mg  40 mg Subcutaneous Q24H Barnetta Chapel, PA-C   40 mg at 02/15/18 7793  . erythromycin ophthalmic ointment   Left Eye Q8H Rayburn, Kelly A, PA-C      . hydrALAZINE (APRESOLINE) injection 10 mg  10 mg Intravenous Q2H PRN Rayburn, Kelly A, PA-C      . morphine 2 MG/ML injection 1-2 mg  1-2 mg Intravenous Q2H PRN Rayburn, Kelly A, PA-C   2 mg at 02/09/18 2044  . ondansetron (ZOFRAN) injection 4 mg  4 mg Intramuscular Q6H PRN Rayburn, Kelly A, PA-C      . ondansetron (ZOFRAN-ODT) disintegrating tablet 4 mg  4 mg Oral Q6H PRN Rayburn, Kelly A, PA-C      . traMADol (ULTRAM) tablet 50 mg  50 mg  Oral Q6H PRN Rayburn, Alphonsus Sias, PA-C         Discharge Medications: Please see discharge summary for a list of discharge medications.  Relevant Imaging Results:  Relevant Lab Results:   Additional Information SSN: [id number]  Gildardo Griffes, LCSW

## 2018-02-16 DIAGNOSIS — S066X0D Traumatic subarachnoid hemorrhage without loss of consciousness, subsequent encounter: Secondary | ICD-10-CM | POA: Diagnosis not present

## 2018-02-16 DIAGNOSIS — R531 Weakness: Secondary | ICD-10-CM | POA: Diagnosis not present

## 2018-02-16 DIAGNOSIS — M2550 Pain in unspecified joint: Secondary | ICD-10-CM | POA: Diagnosis not present

## 2018-02-16 DIAGNOSIS — R509 Fever, unspecified: Secondary | ICD-10-CM | POA: Diagnosis not present

## 2018-02-16 DIAGNOSIS — S06899A Other specified intracranial injury with loss of consciousness of unspecified duration, initial encounter: Secondary | ICD-10-CM | POA: Diagnosis not present

## 2018-02-16 DIAGNOSIS — R2681 Unsteadiness on feet: Secondary | ICD-10-CM | POA: Diagnosis not present

## 2018-02-16 DIAGNOSIS — F0390 Unspecified dementia without behavioral disturbance: Secondary | ICD-10-CM | POA: Diagnosis not present

## 2018-02-16 DIAGNOSIS — S0592XA Unspecified injury of left eye and orbit, initial encounter: Secondary | ICD-10-CM | POA: Diagnosis not present

## 2018-02-16 DIAGNOSIS — R269 Unspecified abnormalities of gait and mobility: Secondary | ICD-10-CM | POA: Diagnosis not present

## 2018-02-16 DIAGNOSIS — R410 Disorientation, unspecified: Secondary | ICD-10-CM | POA: Diagnosis not present

## 2018-02-16 DIAGNOSIS — R05 Cough: Secondary | ICD-10-CM | POA: Diagnosis not present

## 2018-02-16 DIAGNOSIS — H33052 Total retinal detachment, left eye: Secondary | ICD-10-CM | POA: Diagnosis not present

## 2018-02-16 DIAGNOSIS — M6281 Muscle weakness (generalized): Secondary | ICD-10-CM | POA: Diagnosis not present

## 2018-02-16 DIAGNOSIS — R609 Edema, unspecified: Secondary | ICD-10-CM | POA: Diagnosis not present

## 2018-02-16 DIAGNOSIS — R600 Localized edema: Secondary | ICD-10-CM | POA: Diagnosis not present

## 2018-02-16 DIAGNOSIS — R279 Unspecified lack of coordination: Secondary | ICD-10-CM | POA: Diagnosis not present

## 2018-02-16 DIAGNOSIS — R41841 Cognitive communication deficit: Secondary | ICD-10-CM | POA: Diagnosis not present

## 2018-02-16 DIAGNOSIS — H3342 Traction detachment of retina, left eye: Secondary | ICD-10-CM | POA: Diagnosis not present

## 2018-02-16 DIAGNOSIS — H4312 Vitreous hemorrhage, left eye: Secondary | ICD-10-CM | POA: Diagnosis not present

## 2018-02-16 DIAGNOSIS — H35361 Drusen (degenerative) of macula, right eye: Secondary | ICD-10-CM | POA: Diagnosis not present

## 2018-02-16 DIAGNOSIS — Z87820 Personal history of traumatic brain injury: Secondary | ICD-10-CM | POA: Diagnosis not present

## 2018-02-16 DIAGNOSIS — S0522XD Ocular laceration and rupture with prolapse or loss of intraocular tissue, left eye, subsequent encounter: Secondary | ICD-10-CM | POA: Diagnosis not present

## 2018-02-16 DIAGNOSIS — H2102 Hyphema, left eye: Secondary | ICD-10-CM | POA: Diagnosis not present

## 2018-02-16 DIAGNOSIS — Z7401 Bed confinement status: Secondary | ICD-10-CM | POA: Diagnosis not present

## 2018-02-16 DIAGNOSIS — D649 Anemia, unspecified: Secondary | ICD-10-CM | POA: Diagnosis not present

## 2018-02-16 DIAGNOSIS — W19XXXD Unspecified fall, subsequent encounter: Secondary | ICD-10-CM | POA: Diagnosis not present

## 2018-02-16 DIAGNOSIS — S0993XD Unspecified injury of face, subsequent encounter: Secondary | ICD-10-CM | POA: Diagnosis not present

## 2018-02-16 DIAGNOSIS — I10 Essential (primary) hypertension: Secondary | ICD-10-CM | POA: Diagnosis not present

## 2018-02-16 DIAGNOSIS — H353111 Nonexudative age-related macular degeneration, right eye, early dry stage: Secondary | ICD-10-CM | POA: Diagnosis not present

## 2018-02-16 DIAGNOSIS — R0902 Hypoxemia: Secondary | ICD-10-CM | POA: Diagnosis not present

## 2018-02-16 NOTE — Social Work (Addendum)
CSW attempted to leave message with GAL (guardian ad litem) Orie Rout, [phone]). No answer and no voicemail set up.   Will reach out to [facility] regarding paperwork completion for pt discharge.  Octavio Graves, MSW, [facility] Clinical Social Work [phone]

## 2018-02-16 NOTE — Progress Notes (Signed)
Patient has left hospital with PASR and gave report to nurse on [facility] unit [facility]. Maeola Sarah, RN

## 2018-02-16 NOTE — Clinical Social Work Placement (Signed)
   CLINICAL SOCIAL WORK PLACEMENT  NOTE [facility] RN to call report to [phone]  Date:  02/16/2018  Patient Details  Name: Natalie Spencer MRN: [id number] Date of Birth: Jan 30, 1926  Clinical Social Work is seeking post-discharge placement for this patient at the Skilled  Nursing Facility level of care (*CSW will initial, date and re-position this form in  chart as items are completed):  Yes   Patient/family provided with [facility] Clinical Social Work Department's list of facilities offering this level of care within the geographic area requested by the patient (or if unable, by the patient's family).  Yes   Patient/family informed of their freedom to choose among providers that offer the needed level of care, that participate in Medicare, Medicaid or managed care program needed by the patient, have an available bed and are willing to accept the patient.  Yes   Patient/family informed of [facility]'s ownership interest in [facility] and [facility], as well as of the fact that they are under no obligation to receive care at these facilities.  PASRR submitted to EDS on       PASRR number received on 02/15/18     Existing PASRR number confirmed on       FL2 transmitted to all facilities in geographic area requested by pt/family on 02/15/18     FL2 transmitted to all facilities within larger geographic area on       Patient informed that his/her managed care company has contracts with or will negotiate with certain facilities, including the following:        Yes   Patient/family informed of bed offers received.  Patient chooses bed at [facility] and [facility]     Physician recommends and patient chooses bed at      Patient to be [facility] on 02/16/18.  Patient to be transferred to facility by [facility]     Patient family notified on 02/16/18 of transfer.  Name of family member notified:  pt son Loraine Leriche; Guardian  Ad litem Brett Canales     PHYSICIAN       Additional Comment:    _______________________________________________ Doy Hutching, LCSWA 02/16/2018, 11:49 AM

## 2018-02-16 NOTE — Social Work (Signed)
Clinical Social Worker facilitated patient discharge including contacting patient family and facility to confirm patient discharge plans.  Clinical information faxed to facility and family agreeable with plan.  CSW arranged ambulance transport via [facility] to [facility]. RN to call [phone]  with report prior to discharge.  Clinical Social Worker will sign off for now as social work intervention is no longer needed. Please consult [place] again if new need arises.  Octavio Graves, MSW, [facility] Clinical Social Worker [phone]

## 2018-02-16 NOTE — Progress Notes (Signed)
Physical Therapy Treatment Patient Details Name: Natalie Spencer MRN: [id number] DOB: Mar 15, 1926 Today's Date: 02/16/2018    History of Present Illness 85 yo admitted after fall at memory care with left frontal intracerebral contusion and left globe rupture.Pt s/p globe repair 1/28.  PMhx: dementia, HTN    PT Comments    Pt ambulated to/from restroom in room and ~150 ft with RW. Pt able to follow one step commands today but is confused and disoriented to situation and place. Pt able to perform AROM of bilateral knee flexion/extension and hip flexion with cuing. Pt required frequent redirection to tasks.   Follow Up Recommendations  Supervision/Assistance - 24 hour;SNF     Equipment Recommendations  None recommended by PT    Recommendations for Other Services       Precautions / Restrictions Precautions Precautions: Fall Restrictions Weight Bearing Restrictions: No    Mobility  Bed Mobility Overal bed mobility: Needs Assistance Bed Mobility: Supine to Sit     Supine to sit: Min guard     General bed mobility comments: verbal cues for scooting EOB to complete transfer  Transfers Overall transfer level: Needs assistance Equipment used: Rolling walker (2 wheeled) Transfers: Sit to/from Stand Sit to Stand: Min assist         General transfer comment: cues for hand placement, assist to rise  Ambulation/Gait Ambulation/Gait assistance: Min assist Gait Distance (Feet): 150 Feet Assistive device: Rolling walker (2 wheeled) Gait Pattern/deviations: Step-through pattern;Trunk flexed   Gait velocity interpretation: 1.31 - 2.62 ft/sec, indicative of limited community ambulator General Gait Details: pt required verbal cuing for improved posture and direction   Stairs             Wheelchair Mobility    Modified Rankin (Stroke Patients Only)       Balance Overall balance assessment: Needs assistance Sitting-balance support: Bilateral upper extremity  supported;Feet supported Sitting balance-Leahy Scale: Good     Standing balance support: Bilateral upper extremity supported Standing balance-Leahy Scale: Fair Standing balance comment: pt able to wash hands at sink without UE support                            Cognition Arousal/Alertness: Awake/alert   Overall Cognitive Status: History of cognitive impairments - at baseline Area of Impairment: Safety/judgement;Orientation;Problem solving;Memory;Attention                     Memory: Decreased short-term memory Following Commands: Follows one step commands with increased time Safety/Judgement: Decreased awareness of safety;Decreased awareness of deficits   Problem Solving: Slow processing;Difficulty sequencing;Decreased initiation;Requires verbal cues        Exercises Total Joint Exercises Long Arc Quad: AROM;15 reps;Seated;Both Marching in Standing: AROM;15 reps;Seated;Both    General Comments        Pertinent Vitals/Pain Pain Assessment: No/denies pain    Home Living                      Prior Function            PT Goals (current goals can now be found in the care plan section) Progress towards PT goals: Progressing toward goals    Frequency    Min 2X/week      PT Plan Current plan remains appropriate    Co-evaluation              AM-PAC PT "6 Clicks" Mobility   Outcome Measure  Help needed  turning from your back to your side while in a flat bed without using bedrails?: A Little Help needed moving from lying on your back to sitting on the side of a flat bed without using bedrails?: A Little Help needed moving to and from a bed to a chair (including a wheelchair)?: A Little Help needed standing up from a chair using your arms (e.g., wheelchair or bedside chair)?: A Little Help needed to walk in hospital room?: A Little Help needed climbing 3-5 steps with a railing? : A Lot 6 Click Score: 17    End of Session  Equipment Utilized During Treatment: Gait belt Activity Tolerance: Patient tolerated treatment well Patient left: in chair;with call bell/phone within reach;with chair alarm set   PT Visit Diagnosis: History of falling (Z91.81);Other abnormalities of gait and mobility (R26.89);Unsteadiness on feet (R26.81)     Time: 7628-3151 PT Time Calculation (min) (ACUTE ONLY): 35 min  Charges:  $Gait Training: 8-22 mins $Therapeutic Activity: 8-22 mins                        Danisa Kopec, SPT 02/16/2018, 11:33 AM  [phone]

## 2018-02-18 DIAGNOSIS — F0390 Unspecified dementia without behavioral disturbance: Secondary | ICD-10-CM | POA: Diagnosis not present

## 2018-02-18 DIAGNOSIS — D649 Anemia, unspecified: Secondary | ICD-10-CM | POA: Diagnosis not present

## 2018-02-18 DIAGNOSIS — I10 Essential (primary) hypertension: Secondary | ICD-10-CM | POA: Diagnosis not present

## 2018-02-18 DIAGNOSIS — R531 Weakness: Secondary | ICD-10-CM | POA: Diagnosis not present

## 2018-03-02 DIAGNOSIS — H2102 Hyphema, left eye: Secondary | ICD-10-CM | POA: Diagnosis not present

## 2018-03-02 DIAGNOSIS — H33052 Total retinal detachment, left eye: Secondary | ICD-10-CM | POA: Diagnosis not present

## 2018-03-02 DIAGNOSIS — H3342 Traction detachment of retina, left eye: Secondary | ICD-10-CM | POA: Diagnosis not present

## 2018-03-02 DIAGNOSIS — H35361 Drusen (degenerative) of macula, right eye: Secondary | ICD-10-CM | POA: Diagnosis not present

## 2018-03-02 DIAGNOSIS — H4312 Vitreous hemorrhage, left eye: Secondary | ICD-10-CM | POA: Diagnosis not present

## 2018-03-02 DIAGNOSIS — H353111 Nonexudative age-related macular degeneration, right eye, early dry stage: Secondary | ICD-10-CM | POA: Diagnosis not present

## 2018-03-24 DIAGNOSIS — W1789XA Other fall from one level to another, initial encounter: Secondary | ICD-10-CM | POA: Diagnosis not present

## 2018-03-24 DIAGNOSIS — F0280 Dementia in other diseases classified elsewhere without behavioral disturbance: Secondary | ICD-10-CM | POA: Diagnosis not present

## 2018-03-24 DIAGNOSIS — R5381 Other malaise: Secondary | ICD-10-CM | POA: Diagnosis not present

## 2018-03-24 DIAGNOSIS — R2681 Unsteadiness on feet: Secondary | ICD-10-CM | POA: Diagnosis not present

## 2018-04-08 DIAGNOSIS — S2242XA Multiple fractures of ribs, left side, initial encounter for closed fracture: Secondary | ICD-10-CM | POA: Diagnosis not present

## 2018-04-08 DIAGNOSIS — M47812 Spondylosis without myelopathy or radiculopathy, cervical region: Secondary | ICD-10-CM | POA: Diagnosis not present

## 2018-04-08 DIAGNOSIS — M1611 Unilateral primary osteoarthritis, right hip: Secondary | ICD-10-CM | POA: Diagnosis not present

## 2018-04-22 DIAGNOSIS — B351 Tinea unguium: Secondary | ICD-10-CM | POA: Diagnosis not present

## 2018-04-22 DIAGNOSIS — M79675 Pain in left toe(s): Secondary | ICD-10-CM | POA: Diagnosis not present

## 2018-04-22 DIAGNOSIS — M79674 Pain in right toe(s): Secondary | ICD-10-CM | POA: Diagnosis not present

## 2018-05-12 DIAGNOSIS — R5381 Other malaise: Secondary | ICD-10-CM | POA: Diagnosis not present

## 2018-05-12 DIAGNOSIS — F0280 Dementia in other diseases classified elsewhere without behavioral disturbance: Secondary | ICD-10-CM | POA: Diagnosis not present

## 2018-05-24 DIAGNOSIS — S0003XA Contusion of scalp, initial encounter: Secondary | ICD-10-CM | POA: Diagnosis not present

## 2018-05-24 DIAGNOSIS — M50323 Other cervical disc degeneration at C6-C7 level: Secondary | ICD-10-CM | POA: Diagnosis not present

## 2018-05-24 DIAGNOSIS — I10 Essential (primary) hypertension: Secondary | ICD-10-CM | POA: Diagnosis not present

## 2018-05-24 DIAGNOSIS — S3993XA Unspecified injury of pelvis, initial encounter: Secondary | ICD-10-CM | POA: Diagnosis not present

## 2018-05-24 DIAGNOSIS — Y998 Other external cause status: Secondary | ICD-10-CM | POA: Diagnosis not present

## 2018-05-24 DIAGNOSIS — G319 Degenerative disease of nervous system, unspecified: Secondary | ICD-10-CM | POA: Diagnosis not present

## 2018-05-24 DIAGNOSIS — R279 Unspecified lack of coordination: Secondary | ICD-10-CM | POA: Diagnosis not present

## 2018-05-24 DIAGNOSIS — S32040A Wedge compression fracture of fourth lumbar vertebra, initial encounter for closed fracture: Secondary | ICD-10-CM | POA: Diagnosis not present

## 2018-05-24 DIAGNOSIS — I6782 Cerebral ischemia: Secondary | ICD-10-CM | POA: Diagnosis not present

## 2018-05-24 DIAGNOSIS — R1111 Vomiting without nausea: Secondary | ICD-10-CM | POA: Diagnosis not present

## 2018-05-24 DIAGNOSIS — R404 Transient alteration of awareness: Secondary | ICD-10-CM | POA: Diagnosis not present

## 2018-05-24 DIAGNOSIS — J9811 Atelectasis: Secondary | ICD-10-CM | POA: Diagnosis not present

## 2018-05-24 DIAGNOSIS — S199XXA Unspecified injury of neck, initial encounter: Secondary | ICD-10-CM | POA: Diagnosis not present

## 2018-05-24 DIAGNOSIS — I700 Atherosclerosis of aorta: Secondary | ICD-10-CM | POA: Diagnosis not present

## 2018-05-24 DIAGNOSIS — S299XXA Unspecified injury of thorax, initial encounter: Secondary | ICD-10-CM | POA: Diagnosis not present

## 2018-05-24 DIAGNOSIS — J189 Pneumonia, unspecified organism: Secondary | ICD-10-CM | POA: Diagnosis not present

## 2018-05-24 DIAGNOSIS — U071 COVID-19: Secondary | ICD-10-CM | POA: Diagnosis not present

## 2018-05-24 DIAGNOSIS — Z743 Need for continuous supervision: Secondary | ICD-10-CM | POA: Diagnosis not present

## 2018-05-24 DIAGNOSIS — M50322 Other cervical disc degeneration at C5-C6 level: Secondary | ICD-10-CM | POA: Diagnosis not present

## 2018-05-24 DIAGNOSIS — R0902 Hypoxemia: Secondary | ICD-10-CM | POA: Diagnosis not present

## 2018-05-24 DIAGNOSIS — W0110XA Fall on same level from slipping, tripping and stumbling with subsequent striking against unspecified object, initial encounter: Secondary | ICD-10-CM | POA: Diagnosis not present

## 2018-05-24 DIAGNOSIS — R52 Pain, unspecified: Secondary | ICD-10-CM | POA: Diagnosis not present

## 2018-05-24 DIAGNOSIS — W19XXXA Unspecified fall, initial encounter: Secondary | ICD-10-CM | POA: Diagnosis not present

## 2018-05-25 DIAGNOSIS — Y998 Other external cause status: Secondary | ICD-10-CM | POA: Diagnosis not present

## 2018-05-25 DIAGNOSIS — S32040A Wedge compression fracture of fourth lumbar vertebra, initial encounter for closed fracture: Secondary | ICD-10-CM | POA: Diagnosis not present

## 2018-05-25 DIAGNOSIS — W0110XA Fall on same level from slipping, tripping and stumbling with subsequent striking against unspecified object, initial encounter: Secondary | ICD-10-CM | POA: Diagnosis not present

## 2018-05-25 DIAGNOSIS — S0003XA Contusion of scalp, initial encounter: Secondary | ICD-10-CM | POA: Diagnosis not present

## 2018-05-25 DIAGNOSIS — U071 COVID-19: Secondary | ICD-10-CM | POA: Diagnosis not present

## 2018-05-25 DIAGNOSIS — J189 Pneumonia, unspecified organism: Secondary | ICD-10-CM | POA: Diagnosis not present

## 2018-05-26 DIAGNOSIS — S0003XA Contusion of scalp, initial encounter: Secondary | ICD-10-CM | POA: Diagnosis not present

## 2018-05-26 DIAGNOSIS — U071 COVID-19: Secondary | ICD-10-CM | POA: Diagnosis not present

## 2018-05-26 DIAGNOSIS — J189 Pneumonia, unspecified organism: Secondary | ICD-10-CM | POA: Diagnosis not present

## 2018-05-26 DIAGNOSIS — S32040A Wedge compression fracture of fourth lumbar vertebra, initial encounter for closed fracture: Secondary | ICD-10-CM | POA: Diagnosis not present

## 2018-06-02 DIAGNOSIS — J188 Other pneumonia, unspecified organism: Secondary | ICD-10-CM | POA: Diagnosis not present

## 2018-06-02 DIAGNOSIS — U071 COVID-19: Secondary | ICD-10-CM | POA: Diagnosis not present

## 2018-06-05 DIAGNOSIS — R0989 Other specified symptoms and signs involving the circulatory and respiratory systems: Secondary | ICD-10-CM | POA: Diagnosis not present

## 2018-06-09 DIAGNOSIS — U071 COVID-19: Secondary | ICD-10-CM | POA: Diagnosis not present

## 2018-06-09 DIAGNOSIS — J188 Other pneumonia, unspecified organism: Secondary | ICD-10-CM | POA: Diagnosis not present

## 2018-06-23 DIAGNOSIS — U071 COVID-19: Secondary | ICD-10-CM | POA: Diagnosis not present

## 2018-06-23 DIAGNOSIS — F0390 Unspecified dementia without behavioral disturbance: Secondary | ICD-10-CM | POA: Diagnosis not present

## 2018-06-23 DIAGNOSIS — M6281 Muscle weakness (generalized): Secondary | ICD-10-CM | POA: Diagnosis not present

## 2018-06-23 DIAGNOSIS — J189 Pneumonia, unspecified organism: Secondary | ICD-10-CM | POA: Diagnosis not present

## 2018-06-30 DIAGNOSIS — J189 Pneumonia, unspecified organism: Secondary | ICD-10-CM | POA: Diagnosis not present

## 2018-06-30 DIAGNOSIS — U071 COVID-19: Secondary | ICD-10-CM | POA: Diagnosis not present

## 2018-06-30 DIAGNOSIS — M6281 Muscle weakness (generalized): Secondary | ICD-10-CM | POA: Diagnosis not present

## 2018-06-30 DIAGNOSIS — F0390 Unspecified dementia without behavioral disturbance: Secondary | ICD-10-CM | POA: Diagnosis not present

## 2018-07-01 DIAGNOSIS — M6281 Muscle weakness (generalized): Secondary | ICD-10-CM | POA: Diagnosis not present

## 2018-07-01 DIAGNOSIS — J189 Pneumonia, unspecified organism: Secondary | ICD-10-CM | POA: Diagnosis not present

## 2018-07-01 DIAGNOSIS — U071 COVID-19: Secondary | ICD-10-CM | POA: Diagnosis not present

## 2018-07-01 DIAGNOSIS — F0390 Unspecified dementia without behavioral disturbance: Secondary | ICD-10-CM | POA: Diagnosis not present

## 2018-07-02 DIAGNOSIS — I10 Essential (primary) hypertension: Secondary | ICD-10-CM | POA: Diagnosis not present

## 2018-07-02 DIAGNOSIS — F0390 Unspecified dementia without behavioral disturbance: Secondary | ICD-10-CM | POA: Diagnosis not present

## 2018-07-02 DIAGNOSIS — M1712 Unilateral primary osteoarthritis, left knee: Secondary | ICD-10-CM | POA: Diagnosis not present

## 2018-07-02 DIAGNOSIS — W19XXXA Unspecified fall, initial encounter: Secondary | ICD-10-CM | POA: Diagnosis not present

## 2018-07-02 DIAGNOSIS — W06XXXA Fall from bed, initial encounter: Secondary | ICD-10-CM | POA: Diagnosis not present

## 2018-07-02 DIAGNOSIS — R4182 Altered mental status, unspecified: Secondary | ICD-10-CM | POA: Diagnosis not present

## 2018-07-02 DIAGNOSIS — M25562 Pain in left knee: Secondary | ICD-10-CM | POA: Diagnosis not present

## 2018-07-02 DIAGNOSIS — Y92129 Unspecified place in nursing home as the place of occurrence of the external cause: Secondary | ICD-10-CM | POA: Diagnosis not present

## 2018-07-02 DIAGNOSIS — S0990XA Unspecified injury of head, initial encounter: Secondary | ICD-10-CM | POA: Diagnosis not present

## 2018-07-02 DIAGNOSIS — R0902 Hypoxemia: Secondary | ICD-10-CM | POA: Diagnosis not present

## 2018-07-02 DIAGNOSIS — M542 Cervicalgia: Secondary | ICD-10-CM | POA: Diagnosis not present

## 2018-07-02 DIAGNOSIS — Y998 Other external cause status: Secondary | ICD-10-CM | POA: Diagnosis not present

## 2018-07-02 DIAGNOSIS — R5381 Other malaise: Secondary | ICD-10-CM | POA: Diagnosis not present

## 2018-07-03 DIAGNOSIS — R112 Nausea with vomiting, unspecified: Secondary | ICD-10-CM | POA: Diagnosis not present

## 2018-07-03 DIAGNOSIS — R52 Pain, unspecified: Secondary | ICD-10-CM | POA: Diagnosis not present

## 2018-07-03 DIAGNOSIS — R279 Unspecified lack of coordination: Secondary | ICD-10-CM | POA: Diagnosis not present

## 2018-07-03 DIAGNOSIS — Z743 Need for continuous supervision: Secondary | ICD-10-CM | POA: Diagnosis not present

## 2018-07-03 DIAGNOSIS — R4182 Altered mental status, unspecified: Secondary | ICD-10-CM | POA: Diagnosis not present

## 2018-07-03 DIAGNOSIS — R001 Bradycardia, unspecified: Secondary | ICD-10-CM | POA: Diagnosis not present

## 2018-07-03 DIAGNOSIS — R110 Nausea: Secondary | ICD-10-CM | POA: Diagnosis not present

## 2018-07-03 DIAGNOSIS — R197 Diarrhea, unspecified: Secondary | ICD-10-CM | POA: Diagnosis not present

## 2018-07-03 DIAGNOSIS — K5730 Diverticulosis of large intestine without perforation or abscess without bleeding: Secondary | ICD-10-CM | POA: Diagnosis not present

## 2018-07-03 DIAGNOSIS — R918 Other nonspecific abnormal finding of lung field: Secondary | ICD-10-CM | POA: Diagnosis not present

## 2018-07-03 DIAGNOSIS — R404 Transient alteration of awareness: Secondary | ICD-10-CM | POA: Diagnosis not present

## 2018-07-03 DIAGNOSIS — I10 Essential (primary) hypertension: Secondary | ICD-10-CM | POA: Diagnosis not present

## 2018-07-03 DIAGNOSIS — I700 Atherosclerosis of aorta: Secondary | ICD-10-CM | POA: Diagnosis not present

## 2018-07-03 DIAGNOSIS — I499 Cardiac arrhythmia, unspecified: Secondary | ICD-10-CM | POA: Diagnosis not present

## 2018-07-03 DIAGNOSIS — R1111 Vomiting without nausea: Secondary | ICD-10-CM | POA: Diagnosis not present

## 2018-07-06 DIAGNOSIS — M6281 Muscle weakness (generalized): Secondary | ICD-10-CM | POA: Diagnosis not present

## 2018-07-06 DIAGNOSIS — J189 Pneumonia, unspecified organism: Secondary | ICD-10-CM | POA: Diagnosis not present

## 2018-07-06 DIAGNOSIS — F0390 Unspecified dementia without behavioral disturbance: Secondary | ICD-10-CM | POA: Diagnosis not present

## 2018-07-06 DIAGNOSIS — U071 COVID-19: Secondary | ICD-10-CM | POA: Diagnosis not present

## 2018-07-09 DIAGNOSIS — M6281 Muscle weakness (generalized): Secondary | ICD-10-CM | POA: Diagnosis not present

## 2018-07-09 DIAGNOSIS — F0390 Unspecified dementia without behavioral disturbance: Secondary | ICD-10-CM | POA: Diagnosis not present

## 2018-07-09 DIAGNOSIS — U071 COVID-19: Secondary | ICD-10-CM | POA: Diagnosis not present

## 2018-07-09 DIAGNOSIS — J189 Pneumonia, unspecified organism: Secondary | ICD-10-CM | POA: Diagnosis not present

## 2018-07-15 DIAGNOSIS — B351 Tinea unguium: Secondary | ICD-10-CM | POA: Diagnosis not present

## 2018-07-15 DIAGNOSIS — M79674 Pain in right toe(s): Secondary | ICD-10-CM | POA: Diagnosis not present

## 2018-07-15 DIAGNOSIS — M79675 Pain in left toe(s): Secondary | ICD-10-CM | POA: Diagnosis not present

## 2018-07-19 DIAGNOSIS — M6281 Muscle weakness (generalized): Secondary | ICD-10-CM | POA: Diagnosis not present

## 2018-07-19 DIAGNOSIS — U071 COVID-19: Secondary | ICD-10-CM | POA: Diagnosis not present

## 2018-07-19 DIAGNOSIS — J189 Pneumonia, unspecified organism: Secondary | ICD-10-CM | POA: Diagnosis not present

## 2018-07-19 DIAGNOSIS — F0390 Unspecified dementia without behavioral disturbance: Secondary | ICD-10-CM | POA: Diagnosis not present

## 2018-07-21 DIAGNOSIS — F0390 Unspecified dementia without behavioral disturbance: Secondary | ICD-10-CM | POA: Diagnosis not present

## 2018-07-21 DIAGNOSIS — M6281 Muscle weakness (generalized): Secondary | ICD-10-CM | POA: Diagnosis not present

## 2018-07-21 DIAGNOSIS — U071 COVID-19: Secondary | ICD-10-CM | POA: Diagnosis not present

## 2018-07-21 DIAGNOSIS — J189 Pneumonia, unspecified organism: Secondary | ICD-10-CM | POA: Diagnosis not present

## 2018-07-23 DIAGNOSIS — M6281 Muscle weakness (generalized): Secondary | ICD-10-CM | POA: Diagnosis not present

## 2018-07-23 DIAGNOSIS — U071 COVID-19: Secondary | ICD-10-CM | POA: Diagnosis not present

## 2018-07-23 DIAGNOSIS — J189 Pneumonia, unspecified organism: Secondary | ICD-10-CM | POA: Diagnosis not present

## 2018-07-23 DIAGNOSIS — F0390 Unspecified dementia without behavioral disturbance: Secondary | ICD-10-CM | POA: Diagnosis not present

## 2018-07-30 DIAGNOSIS — F0390 Unspecified dementia without behavioral disturbance: Secondary | ICD-10-CM | POA: Diagnosis not present

## 2018-07-30 DIAGNOSIS — J189 Pneumonia, unspecified organism: Secondary | ICD-10-CM | POA: Diagnosis not present

## 2018-07-30 DIAGNOSIS — M6281 Muscle weakness (generalized): Secondary | ICD-10-CM | POA: Diagnosis not present

## 2018-07-30 DIAGNOSIS — U071 COVID-19: Secondary | ICD-10-CM | POA: Diagnosis not present

## 2018-08-11 DIAGNOSIS — H1089 Other conjunctivitis: Secondary | ICD-10-CM | POA: Diagnosis not present

## 2018-08-11 DIAGNOSIS — F0280 Dementia in other diseases classified elsewhere without behavioral disturbance: Secondary | ICD-10-CM | POA: Diagnosis not present

## 2018-08-12 DIAGNOSIS — J189 Pneumonia, unspecified organism: Secondary | ICD-10-CM | POA: Diagnosis not present

## 2018-08-12 DIAGNOSIS — M6281 Muscle weakness (generalized): Secondary | ICD-10-CM | POA: Diagnosis not present

## 2018-08-12 DIAGNOSIS — F0390 Unspecified dementia without behavioral disturbance: Secondary | ICD-10-CM | POA: Diagnosis not present

## 2018-08-12 DIAGNOSIS — U071 COVID-19: Secondary | ICD-10-CM | POA: Diagnosis not present

## 2018-08-13 DIAGNOSIS — F0390 Unspecified dementia without behavioral disturbance: Secondary | ICD-10-CM | POA: Diagnosis not present

## 2018-08-13 DIAGNOSIS — J189 Pneumonia, unspecified organism: Secondary | ICD-10-CM | POA: Diagnosis not present

## 2018-08-13 DIAGNOSIS — U071 COVID-19: Secondary | ICD-10-CM | POA: Diagnosis not present

## 2018-08-13 DIAGNOSIS — M6281 Muscle weakness (generalized): Secondary | ICD-10-CM | POA: Diagnosis not present

## 2018-08-17 DIAGNOSIS — J189 Pneumonia, unspecified organism: Secondary | ICD-10-CM | POA: Diagnosis not present

## 2018-08-17 DIAGNOSIS — M6281 Muscle weakness (generalized): Secondary | ICD-10-CM | POA: Diagnosis not present

## 2018-08-17 DIAGNOSIS — U071 COVID-19: Secondary | ICD-10-CM | POA: Diagnosis not present

## 2018-08-17 DIAGNOSIS — F0390 Unspecified dementia without behavioral disturbance: Secondary | ICD-10-CM | POA: Diagnosis not present

## 2018-08-18 DIAGNOSIS — J189 Pneumonia, unspecified organism: Secondary | ICD-10-CM | POA: Diagnosis not present

## 2018-08-18 DIAGNOSIS — U071 COVID-19: Secondary | ICD-10-CM | POA: Diagnosis not present

## 2018-08-18 DIAGNOSIS — F0390 Unspecified dementia without behavioral disturbance: Secondary | ICD-10-CM | POA: Diagnosis not present

## 2018-08-18 DIAGNOSIS — M6281 Muscle weakness (generalized): Secondary | ICD-10-CM | POA: Diagnosis not present

## 2018-08-22 DIAGNOSIS — M6281 Muscle weakness (generalized): Secondary | ICD-10-CM | POA: Diagnosis not present

## 2018-08-22 DIAGNOSIS — U071 COVID-19: Secondary | ICD-10-CM | POA: Diagnosis not present

## 2018-08-22 DIAGNOSIS — F0390 Unspecified dementia without behavioral disturbance: Secondary | ICD-10-CM | POA: Diagnosis not present

## 2018-08-22 DIAGNOSIS — J189 Pneumonia, unspecified organism: Secondary | ICD-10-CM | POA: Diagnosis not present

## 2018-08-24 DIAGNOSIS — M6281 Muscle weakness (generalized): Secondary | ICD-10-CM | POA: Diagnosis not present

## 2018-08-24 DIAGNOSIS — U071 COVID-19: Secondary | ICD-10-CM | POA: Diagnosis not present

## 2018-08-24 DIAGNOSIS — F0390 Unspecified dementia without behavioral disturbance: Secondary | ICD-10-CM | POA: Diagnosis not present

## 2018-08-24 DIAGNOSIS — J189 Pneumonia, unspecified organism: Secondary | ICD-10-CM | POA: Diagnosis not present

## 2018-08-30 DIAGNOSIS — M6281 Muscle weakness (generalized): Secondary | ICD-10-CM | POA: Diagnosis not present

## 2018-08-30 DIAGNOSIS — J189 Pneumonia, unspecified organism: Secondary | ICD-10-CM | POA: Diagnosis not present

## 2018-08-30 DIAGNOSIS — F0390 Unspecified dementia without behavioral disturbance: Secondary | ICD-10-CM | POA: Diagnosis not present

## 2018-08-30 DIAGNOSIS — U071 COVID-19: Secondary | ICD-10-CM | POA: Diagnosis not present

## 2018-09-01 DIAGNOSIS — F0390 Unspecified dementia without behavioral disturbance: Secondary | ICD-10-CM | POA: Diagnosis not present

## 2018-09-01 DIAGNOSIS — U071 COVID-19: Secondary | ICD-10-CM | POA: Diagnosis not present

## 2018-09-01 DIAGNOSIS — M6281 Muscle weakness (generalized): Secondary | ICD-10-CM | POA: Diagnosis not present

## 2018-09-01 DIAGNOSIS — J189 Pneumonia, unspecified organism: Secondary | ICD-10-CM | POA: Diagnosis not present

## 2018-09-06 DIAGNOSIS — F0390 Unspecified dementia without behavioral disturbance: Secondary | ICD-10-CM | POA: Diagnosis not present

## 2018-09-06 DIAGNOSIS — J189 Pneumonia, unspecified organism: Secondary | ICD-10-CM | POA: Diagnosis not present

## 2018-09-06 DIAGNOSIS — M6281 Muscle weakness (generalized): Secondary | ICD-10-CM | POA: Diagnosis not present

## 2018-09-06 DIAGNOSIS — U071 COVID-19: Secondary | ICD-10-CM | POA: Diagnosis not present

## 2018-09-08 DIAGNOSIS — F0390 Unspecified dementia without behavioral disturbance: Secondary | ICD-10-CM | POA: Diagnosis not present

## 2018-09-08 DIAGNOSIS — M6281 Muscle weakness (generalized): Secondary | ICD-10-CM | POA: Diagnosis not present

## 2018-09-08 DIAGNOSIS — J189 Pneumonia, unspecified organism: Secondary | ICD-10-CM | POA: Diagnosis not present

## 2018-09-08 DIAGNOSIS — U071 COVID-19: Secondary | ICD-10-CM | POA: Diagnosis not present

## 2018-09-14 DIAGNOSIS — M6281 Muscle weakness (generalized): Secondary | ICD-10-CM | POA: Diagnosis not present

## 2018-09-14 DIAGNOSIS — U071 COVID-19: Secondary | ICD-10-CM | POA: Diagnosis not present

## 2018-09-14 DIAGNOSIS — F0390 Unspecified dementia without behavioral disturbance: Secondary | ICD-10-CM | POA: Diagnosis not present

## 2018-09-14 DIAGNOSIS — J189 Pneumonia, unspecified organism: Secondary | ICD-10-CM | POA: Diagnosis not present

## 2018-09-16 DIAGNOSIS — M6281 Muscle weakness (generalized): Secondary | ICD-10-CM | POA: Diagnosis not present

## 2018-09-16 DIAGNOSIS — U071 COVID-19: Secondary | ICD-10-CM | POA: Diagnosis not present

## 2018-09-16 DIAGNOSIS — F0390 Unspecified dementia without behavioral disturbance: Secondary | ICD-10-CM | POA: Diagnosis not present

## 2018-09-16 DIAGNOSIS — J189 Pneumonia, unspecified organism: Secondary | ICD-10-CM | POA: Diagnosis not present

## 2018-09-21 DIAGNOSIS — F0390 Unspecified dementia without behavioral disturbance: Secondary | ICD-10-CM | POA: Diagnosis not present

## 2018-09-21 DIAGNOSIS — M6281 Muscle weakness (generalized): Secondary | ICD-10-CM | POA: Diagnosis not present

## 2018-09-21 DIAGNOSIS — J189 Pneumonia, unspecified organism: Secondary | ICD-10-CM | POA: Diagnosis not present

## 2018-09-21 DIAGNOSIS — U071 COVID-19: Secondary | ICD-10-CM | POA: Diagnosis not present

## 2018-09-22 DIAGNOSIS — R2681 Unsteadiness on feet: Secondary | ICD-10-CM | POA: Diagnosis not present

## 2018-09-22 DIAGNOSIS — W1789XA Other fall from one level to another, initial encounter: Secondary | ICD-10-CM | POA: Diagnosis not present

## 2018-09-22 DIAGNOSIS — F0390 Unspecified dementia without behavioral disturbance: Secondary | ICD-10-CM | POA: Diagnosis not present

## 2018-09-22 DIAGNOSIS — F0280 Dementia in other diseases classified elsewhere without behavioral disturbance: Secondary | ICD-10-CM | POA: Diagnosis not present

## 2018-09-22 DIAGNOSIS — J189 Pneumonia, unspecified organism: Secondary | ICD-10-CM | POA: Diagnosis not present

## 2018-09-22 DIAGNOSIS — U071 COVID-19: Secondary | ICD-10-CM | POA: Diagnosis not present

## 2018-09-22 DIAGNOSIS — M6281 Muscle weakness (generalized): Secondary | ICD-10-CM | POA: Diagnosis not present

## 2018-09-24 DIAGNOSIS — J189 Pneumonia, unspecified organism: Secondary | ICD-10-CM | POA: Diagnosis not present

## 2018-09-24 DIAGNOSIS — U071 COVID-19: Secondary | ICD-10-CM | POA: Diagnosis not present

## 2018-09-24 DIAGNOSIS — M6281 Muscle weakness (generalized): Secondary | ICD-10-CM | POA: Diagnosis not present

## 2018-09-24 DIAGNOSIS — F0390 Unspecified dementia without behavioral disturbance: Secondary | ICD-10-CM | POA: Diagnosis not present

## 2018-09-28 DIAGNOSIS — M6281 Muscle weakness (generalized): Secondary | ICD-10-CM | POA: Diagnosis not present

## 2018-09-28 DIAGNOSIS — J189 Pneumonia, unspecified organism: Secondary | ICD-10-CM | POA: Diagnosis not present

## 2018-09-28 DIAGNOSIS — F0390 Unspecified dementia without behavioral disturbance: Secondary | ICD-10-CM | POA: Diagnosis not present

## 2018-09-28 DIAGNOSIS — U071 COVID-19: Secondary | ICD-10-CM | POA: Diagnosis not present

## 2018-09-29 DIAGNOSIS — U071 COVID-19: Secondary | ICD-10-CM | POA: Diagnosis not present

## 2018-09-29 DIAGNOSIS — J189 Pneumonia, unspecified organism: Secondary | ICD-10-CM | POA: Diagnosis not present

## 2018-09-29 DIAGNOSIS — M6281 Muscle weakness (generalized): Secondary | ICD-10-CM | POA: Diagnosis not present

## 2018-09-29 DIAGNOSIS — F0390 Unspecified dementia without behavioral disturbance: Secondary | ICD-10-CM | POA: Diagnosis not present

## 2018-10-04 DIAGNOSIS — J189 Pneumonia, unspecified organism: Secondary | ICD-10-CM | POA: Diagnosis not present

## 2018-10-04 DIAGNOSIS — U071 COVID-19: Secondary | ICD-10-CM | POA: Diagnosis not present

## 2018-10-04 DIAGNOSIS — F0390 Unspecified dementia without behavioral disturbance: Secondary | ICD-10-CM | POA: Diagnosis not present

## 2018-10-04 DIAGNOSIS — M6281 Muscle weakness (generalized): Secondary | ICD-10-CM | POA: Diagnosis not present

## 2018-10-06 DIAGNOSIS — H1089 Other conjunctivitis: Secondary | ICD-10-CM | POA: Diagnosis not present

## 2018-10-06 DIAGNOSIS — F0280 Dementia in other diseases classified elsewhere without behavioral disturbance: Secondary | ICD-10-CM | POA: Diagnosis not present

## 2018-10-08 DIAGNOSIS — U071 COVID-19: Secondary | ICD-10-CM | POA: Diagnosis not present

## 2018-10-08 DIAGNOSIS — F0390 Unspecified dementia without behavioral disturbance: Secondary | ICD-10-CM | POA: Diagnosis not present

## 2018-10-08 DIAGNOSIS — M6281 Muscle weakness (generalized): Secondary | ICD-10-CM | POA: Diagnosis not present

## 2018-10-08 DIAGNOSIS — J189 Pneumonia, unspecified organism: Secondary | ICD-10-CM | POA: Diagnosis not present

## 2018-10-09 DIAGNOSIS — M6281 Muscle weakness (generalized): Secondary | ICD-10-CM | POA: Diagnosis not present

## 2018-10-09 DIAGNOSIS — F0390 Unspecified dementia without behavioral disturbance: Secondary | ICD-10-CM | POA: Diagnosis not present

## 2018-10-09 DIAGNOSIS — J189 Pneumonia, unspecified organism: Secondary | ICD-10-CM | POA: Diagnosis not present

## 2018-10-09 DIAGNOSIS — U071 COVID-19: Secondary | ICD-10-CM | POA: Diagnosis not present

## 2018-10-13 DIAGNOSIS — F0280 Dementia in other diseases classified elsewhere without behavioral disturbance: Secondary | ICD-10-CM | POA: Diagnosis not present

## 2018-10-13 DIAGNOSIS — H1089 Other conjunctivitis: Secondary | ICD-10-CM | POA: Diagnosis not present

## 2018-10-20 DIAGNOSIS — J189 Pneumonia, unspecified organism: Secondary | ICD-10-CM | POA: Diagnosis not present

## 2018-10-20 DIAGNOSIS — M6281 Muscle weakness (generalized): Secondary | ICD-10-CM | POA: Diagnosis not present

## 2018-10-20 DIAGNOSIS — F0390 Unspecified dementia without behavioral disturbance: Secondary | ICD-10-CM | POA: Diagnosis not present

## 2018-10-20 DIAGNOSIS — U071 COVID-19: Secondary | ICD-10-CM | POA: Diagnosis not present

## 2018-10-26 DIAGNOSIS — M79674 Pain in right toe(s): Secondary | ICD-10-CM | POA: Diagnosis not present

## 2018-10-26 DIAGNOSIS — M79675 Pain in left toe(s): Secondary | ICD-10-CM | POA: Diagnosis not present

## 2018-10-26 DIAGNOSIS — B351 Tinea unguium: Secondary | ICD-10-CM | POA: Diagnosis not present

## 2018-11-02 DIAGNOSIS — M47816 Spondylosis without myelopathy or radiculopathy, lumbar region: Secondary | ICD-10-CM | POA: Diagnosis not present

## 2018-11-02 DIAGNOSIS — X58XXXA Exposure to other specified factors, initial encounter: Secondary | ICD-10-CM | POA: Diagnosis not present

## 2018-11-02 DIAGNOSIS — M546 Pain in thoracic spine: Secondary | ICD-10-CM | POA: Diagnosis not present

## 2018-11-02 DIAGNOSIS — M542 Cervicalgia: Secondary | ICD-10-CM | POA: Diagnosis not present

## 2018-11-02 DIAGNOSIS — R918 Other nonspecific abnormal finding of lung field: Secondary | ICD-10-CM | POA: Diagnosis not present

## 2018-11-02 DIAGNOSIS — S3992XA Unspecified injury of lower back, initial encounter: Secondary | ICD-10-CM | POA: Diagnosis not present

## 2018-11-02 DIAGNOSIS — Z743 Need for continuous supervision: Secondary | ICD-10-CM | POA: Diagnosis not present

## 2018-11-02 DIAGNOSIS — R4182 Altered mental status, unspecified: Secondary | ICD-10-CM | POA: Diagnosis not present

## 2018-11-02 DIAGNOSIS — R279 Unspecified lack of coordination: Secondary | ICD-10-CM | POA: Diagnosis not present

## 2018-11-02 DIAGNOSIS — M5489 Other dorsalgia: Secondary | ICD-10-CM | POA: Diagnosis not present

## 2018-11-02 DIAGNOSIS — Y998 Other external cause status: Secondary | ICD-10-CM | POA: Diagnosis not present

## 2018-11-02 DIAGNOSIS — S199XXA Unspecified injury of neck, initial encounter: Secondary | ICD-10-CM | POA: Diagnosis not present

## 2018-11-02 DIAGNOSIS — S299XXA Unspecified injury of thorax, initial encounter: Secondary | ICD-10-CM | POA: Diagnosis not present

## 2018-11-02 DIAGNOSIS — I10 Essential (primary) hypertension: Secondary | ICD-10-CM | POA: Diagnosis not present

## 2018-11-02 DIAGNOSIS — R937 Abnormal findings on diagnostic imaging of other parts of musculoskeletal system: Secondary | ICD-10-CM | POA: Diagnosis not present

## 2018-11-02 DIAGNOSIS — S42032A Displaced fracture of lateral end of left clavicle, initial encounter for closed fracture: Secondary | ICD-10-CM | POA: Diagnosis not present

## 2018-11-02 DIAGNOSIS — I700 Atherosclerosis of aorta: Secondary | ICD-10-CM | POA: Diagnosis not present

## 2018-11-02 DIAGNOSIS — M4854XA Collapsed vertebra, not elsewhere classified, thoracic region, initial encounter for fracture: Secondary | ICD-10-CM | POA: Diagnosis not present

## 2018-11-02 DIAGNOSIS — M545 Low back pain: Secondary | ICD-10-CM | POA: Diagnosis not present

## 2018-11-02 DIAGNOSIS — R0902 Hypoxemia: Secondary | ICD-10-CM | POA: Diagnosis not present

## 2018-11-02 DIAGNOSIS — M47812 Spondylosis without myelopathy or radiculopathy, cervical region: Secondary | ICD-10-CM | POA: Diagnosis not present

## 2018-11-02 DIAGNOSIS — S0990XA Unspecified injury of head, initial encounter: Secondary | ICD-10-CM | POA: Diagnosis not present

## 2018-11-02 DIAGNOSIS — Z9181 History of falling: Secondary | ICD-10-CM | POA: Diagnosis not present

## 2018-11-02 DIAGNOSIS — R52 Pain, unspecified: Secondary | ICD-10-CM | POA: Diagnosis not present

## 2018-11-02 DIAGNOSIS — M549 Dorsalgia, unspecified: Secondary | ICD-10-CM | POA: Diagnosis not present

## 2018-11-02 DIAGNOSIS — W1789XA Other fall from one level to another, initial encounter: Secondary | ICD-10-CM | POA: Diagnosis not present

## 2018-11-02 DIAGNOSIS — W19XXXA Unspecified fall, initial encounter: Secondary | ICD-10-CM | POA: Diagnosis not present

## 2018-11-02 DIAGNOSIS — M25519 Pain in unspecified shoulder: Secondary | ICD-10-CM | POA: Diagnosis not present

## 2018-11-02 DIAGNOSIS — F0390 Unspecified dementia without behavioral disturbance: Secondary | ICD-10-CM | POA: Diagnosis not present

## 2018-11-02 DIAGNOSIS — R410 Disorientation, unspecified: Secondary | ICD-10-CM | POA: Diagnosis not present

## 2018-11-03 DIAGNOSIS — F0280 Dementia in other diseases classified elsewhere without behavioral disturbance: Secondary | ICD-10-CM | POA: Diagnosis not present

## 2018-11-03 DIAGNOSIS — S42033A Displaced fracture of lateral end of unspecified clavicle, initial encounter for closed fracture: Secondary | ICD-10-CM | POA: Diagnosis not present

## 2018-11-03 DIAGNOSIS — R296 Repeated falls: Secondary | ICD-10-CM | POA: Diagnosis not present

## 2018-11-04 DIAGNOSIS — M25519 Pain in unspecified shoulder: Secondary | ICD-10-CM | POA: Diagnosis not present

## 2018-11-15 DIAGNOSIS — S0083XA Contusion of other part of head, initial encounter: Secondary | ICD-10-CM | POA: Diagnosis not present

## 2018-11-15 DIAGNOSIS — S42033D Displaced fracture of lateral end of unspecified clavicle, subsequent encounter for fracture with routine healing: Secondary | ICD-10-CM | POA: Diagnosis not present

## 2018-11-15 DIAGNOSIS — Y998 Other external cause status: Secondary | ICD-10-CM | POA: Diagnosis not present

## 2018-11-15 DIAGNOSIS — R404 Transient alteration of awareness: Secondary | ICD-10-CM | POA: Diagnosis not present

## 2018-11-15 DIAGNOSIS — R58 Hemorrhage, not elsewhere classified: Secondary | ICD-10-CM | POA: Diagnosis not present

## 2018-11-15 DIAGNOSIS — I10 Essential (primary) hypertension: Secondary | ICD-10-CM | POA: Diagnosis not present

## 2018-11-15 DIAGNOSIS — S0990XA Unspecified injury of head, initial encounter: Secondary | ICD-10-CM | POA: Diagnosis not present

## 2018-11-15 DIAGNOSIS — W19XXXA Unspecified fall, initial encounter: Secondary | ICD-10-CM | POA: Diagnosis not present

## 2018-11-15 DIAGNOSIS — S0181XA Laceration without foreign body of other part of head, initial encounter: Secondary | ICD-10-CM | POA: Diagnosis not present

## 2018-11-15 DIAGNOSIS — F0390 Unspecified dementia without behavioral disturbance: Secondary | ICD-10-CM | POA: Diagnosis not present

## 2018-11-15 DIAGNOSIS — R296 Repeated falls: Secondary | ICD-10-CM | POA: Diagnosis not present

## 2018-11-15 DIAGNOSIS — S199XXA Unspecified injury of neck, initial encounter: Secondary | ICD-10-CM | POA: Diagnosis not present

## 2018-11-15 DIAGNOSIS — W1839XA Other fall on same level, initial encounter: Secondary | ICD-10-CM | POA: Diagnosis not present

## 2018-11-17 DIAGNOSIS — F0280 Dementia in other diseases classified elsewhere without behavioral disturbance: Secondary | ICD-10-CM | POA: Diagnosis not present

## 2018-11-17 DIAGNOSIS — S0083XA Contusion of other part of head, initial encounter: Secondary | ICD-10-CM | POA: Diagnosis not present

## 2018-11-17 DIAGNOSIS — R2681 Unsteadiness on feet: Secondary | ICD-10-CM | POA: Diagnosis not present

## 2018-11-17 DIAGNOSIS — R296 Repeated falls: Secondary | ICD-10-CM | POA: Diagnosis not present

## 2018-11-24 DIAGNOSIS — M898X1 Other specified disorders of bone, shoulder: Secondary | ICD-10-CM | POA: Diagnosis not present

## 2018-11-24 DIAGNOSIS — S42035A Nondisplaced fracture of lateral end of left clavicle, initial encounter for closed fracture: Secondary | ICD-10-CM | POA: Diagnosis not present

## 2018-12-15 DIAGNOSIS — S42033D Displaced fracture of lateral end of unspecified clavicle, subsequent encounter for fracture with routine healing: Secondary | ICD-10-CM | POA: Diagnosis not present

## 2018-12-15 DIAGNOSIS — F0390 Unspecified dementia without behavioral disturbance: Secondary | ICD-10-CM | POA: Diagnosis not present

## 2018-12-15 DIAGNOSIS — R296 Repeated falls: Secondary | ICD-10-CM | POA: Diagnosis not present

## 2018-12-16 DIAGNOSIS — F4322 Adjustment disorder with anxiety: Secondary | ICD-10-CM | POA: Diagnosis not present

## 2019-02-08 ENCOUNTER — Emergency Department (HOSPITAL_COMMUNITY): Payer: Medicare Other

## 2019-02-08 ENCOUNTER — Encounter (HOSPITAL_COMMUNITY): Payer: Self-pay | Admitting: Emergency Medicine

## 2019-02-08 ENCOUNTER — Other Ambulatory Visit: Payer: Self-pay

## 2019-02-08 ENCOUNTER — Emergency Department (HOSPITAL_COMMUNITY)
Admission: EM | Admit: 2019-02-08 | Discharge: 2019-02-09 | Disposition: A | Discharge status: Home / Self Care | Payer: Medicare Other | Attending: Emergency Medicine | Admitting: Emergency Medicine

## 2019-02-08 DIAGNOSIS — R1110 Vomiting, unspecified: Secondary | ICD-10-CM | POA: Diagnosis not present

## 2019-02-08 DIAGNOSIS — Z79899 Other long term (current) drug therapy: Secondary | ICD-10-CM | POA: Insufficient documentation

## 2019-02-08 DIAGNOSIS — R4182 Altered mental status, unspecified: Secondary | ICD-10-CM | POA: Diagnosis present

## 2019-02-08 DIAGNOSIS — R519 Headache, unspecified: Secondary | ICD-10-CM | POA: Insufficient documentation

## 2019-02-08 DIAGNOSIS — F0390 Unspecified dementia without behavioral disturbance: Secondary | ICD-10-CM | POA: Insufficient documentation

## 2019-02-08 DIAGNOSIS — R5083 Postvaccination fever: Secondary | ICD-10-CM

## 2019-02-08 HISTORY — DX: Cerebral infarction, unspecified: I63.9

## 2019-02-08 LAB — COMPREHENSIVE METABOLIC PANEL
ALT: 13 U/L (ref 0–44)
AST: 20 U/L (ref 15–41)
Albumin: 3.9 g/dL (ref 3.5–5.0)
Alkaline Phosphatase: 71 U/L (ref 38–126)
Anion gap: 10 (ref 5–15)
BUN: 21 mg/dL (ref 8–23)
CO2: 25 mmol/L (ref 22–32)
Calcium: 9.6 mg/dL (ref 8.9–10.3)
Chloride: 102 mmol/L (ref 98–111)
Creatinine, Ser: 0.98 mg/dL (ref 0.44–1.00)
GFR calc Af Amer: 59 mL/min — ABNORMAL LOW (ref >60)
GFR calc non Af Amer: 51 mL/min — ABNORMAL LOW (ref >60)
Glucose, Bld: 125 mg/dL — ABNORMAL HIGH (ref 70–99)
Potassium: 4.1 mmol/L (ref 3.5–5.1)
Sodium: 137 mmol/L (ref 135–145)
Total Bilirubin: 1.2 mg/dL (ref 0.3–1.2)
Total Protein: 7.7 g/dL (ref 6.5–8.1)

## 2019-02-08 LAB — CBC WITH DIFFERENTIAL/PLATELET
Abs Immature Granulocytes: 0.04 10*3/uL (ref 0.00–0.07)
Basophils Absolute: 0.1 10*3/uL (ref 0.0–0.1)
Basophils Relative: 1 %
Eosinophils Absolute: 0 10*3/uL (ref 0.0–0.5)
Eosinophils Relative: 0 %
HCT: 42.2 % (ref 36.0–46.0)
Hemoglobin: 13.2 g/dL (ref 12.0–15.0)
Immature Granulocytes: 1 %
Lymphocytes Relative: 7 %
Lymphs Abs: 0.6 10*3/uL — ABNORMAL LOW (ref 0.7–4.0)
MCH: 28.4 pg (ref 26.0–34.0)
MCHC: 31.3 g/dL (ref 30.0–36.0)
MCV: 90.9 fL (ref 80.0–100.0)
Monocytes Absolute: 0.5 10*3/uL (ref 0.1–1.0)
Monocytes Relative: 6 %
Neutro Abs: 7.4 10*3/uL (ref 1.7–7.7)
Neutrophils Relative %: 85 %
Platelets: 282 10*3/uL (ref 150–400)
RBC: 4.64 MIL/uL (ref 3.87–5.11)
RDW: 13.8 % (ref 11.5–15.5)
WBC: 8.7 10*3/uL (ref 4.0–10.5)
nRBC: 0 % (ref 0.0–0.2)

## 2019-02-08 LAB — URINALYSIS, COMPLETE (UACMP) WITH MICROSCOPIC
Bilirubin Urine: NEGATIVE
Glucose, UA: NEGATIVE mg/dL
Hgb urine dipstick: NEGATIVE
Ketones, ur: NEGATIVE mg/dL
Leukocytes,Ua: NEGATIVE
Nitrite: NEGATIVE
Protein, ur: NEGATIVE mg/dL
Specific Gravity, Urine: 1.018 (ref 1.005–1.030)
pH: 7 (ref 5.0–8.0)

## 2019-02-08 LAB — CBG MONITORING, ED: Glucose-Capillary: 109 mg/dL — ABNORMAL HIGH (ref 70–99)

## 2019-02-08 LAB — AMMONIA: Ammonia: <9 umol/L — ABNORMAL LOW (ref 9–35)

## 2019-02-08 LAB — LACTIC ACID, PLASMA: Lactic Acid, Venous: 1.4 mmol/L (ref 0.5–1.9)

## 2019-02-08 MED ORDER — ACETAMINOPHEN 325 MG PO TABS
650.0000 mg | ORAL_TABLET | Freq: Once | ORAL | Status: AC
  Administered 2019-02-08: 650 mg via ORAL
  Filled 2019-02-08: qty 2

## 2019-02-08 NOTE — ED Notes (Signed)
[facility] called and updated about pt's being discharge.

## 2019-02-08 NOTE — Discharge Instructions (Addendum)
Continue home medications as previously prescribed. Take Tylenol as needed for fever. Return to the ED for injuries or falls, numbness in arms or legs, chest pain or shortness of breath.

## 2019-02-08 NOTE — ED Provider Notes (Signed)
Physical Exam  BP (!) 156/87   Pulse 86   Temp (!) 100.6 F (38.1 C) (Rectal)   Resp 18   LMP  (LMP Unknown)   SpO2 98%   Physical Exam Vitals and nursing note reviewed.  Constitutional:      General: She is not in acute distress.    Appearance: She is well-developed. She is not diaphoretic.  HENT:     Head: Normocephalic and atraumatic.  Eyes:     General: No scleral icterus.    Conjunctiva/sclera: Conjunctivae normal.  Pulmonary:     Effort: Pulmonary effort is normal. No respiratory distress.  Musculoskeletal:     Cervical back: Normal range of motion.  Skin:    Findings: No rash.  Neurological:     Mental Status: She is alert.     ED Course/Procedures   Clinical Course as of Feb 07 1614  Tue Feb 08, 2019  1533 Ammonia(!): <9 [AH]  1533 Lactic Acid, Venous: 1.4 [AH]  1533 WBC: 8.7 [AH]  1533 Glucose(!): 125 [AH]    Clinical Course User Index [AH] Arthor Captain, PA-C    Procedures  MDM   Care of patient assumed from PA Harris at 4:00 PM.  Agree with history, physical exam and plan.  See their note for further details.  Briefly, 86 y.o. female with PMH/PSH as below who presents with emesis from nursing facility.  They are concerned that she was complaining of a headache and acting "abnormally."  Patient unable to provide any history secondary to her dementia.  Of note, patient did receive the first dose of her Covid vaccine yesterday.  She presents to ED febrile.  Lab work and urinalysis thus far has been unremarkable.  Past Medical History:  Diagnosis Date  . Ambulatory dysfunction   . Hypertension   . Senile dementia (HCC)   . Stroke [facility])    Past Surgical History:  Procedure Laterality Date  . CATARACT EXTRACTION, BILATERAL    . LAPAROSCOPIC CHOLECYSTECTOMY    . RUPTURED GLOBE EXPLORATION AND REPAIR Left 02/09/2018   Procedure: REPAIR OF RUPTURED GLOBE LEFT EYE;  Surgeon: Carmela Rima, MD;  Location: [facility] OR;  Service: Ophthalmology;  Laterality:  Left;      Current Plan: Obtain chest x-ray and CT of the head.  If negative, patient will be discharged back to facility.    MDM/ED Course: Imaging here including chest x-ray and CT of the head is unremarkable.  No acute abnormalities found.  Patient fever improved here with Tylenol.  She remains hemodynamically stable.  She will be discharged back to her facility with return precautions.  I attempted to call patient's legal guardian, Barnett Abu which was listed on her facility facesheet.  However there was no reply.  Consults: None   Significant labs/images: CT HEAD WO CONTRAST  Result Date: 02/08/2019 CLINICAL DATA:  Headache. EXAM: CT HEAD WITHOUT CONTRAST TECHNIQUE: Contiguous axial images were obtained from the base of the skull through the vertex without intravenous contrast. COMPARISON:  11/15/2018 FINDINGS: Brain: There is no evidence for acute hemorrhage, hydrocephalus, mass lesion, or abnormal extra-axial fluid collection. No definite CT evidence for acute infarction. Diffuse loss of parenchymal volume is consistent with atrophy. Patchy low attenuation in the deep hemispheric and periventricular white matter is nonspecific, but likely reflects chronic microvascular ischemic Vascular: No hyperdense vessel or unexpected calcification. Skull: No evidence for fracture. No worrisome lytic or sclerotic lesion. Sinuses/Orbits: The visualized paranasal sinuses and mastoid air cells are clear. There is  some polypoid mucosal disease in the left maxillary sinus, similar to prior. Visualized portions of the globes and intraorbital fat are unremarkable. Other: None. IMPRESSION: 1. No acute intracranial abnormality. 2. Atrophy with chronic small vessel white matter ischemic disease. Electronically Signed   By: Misty Stanley M.D.   On: 02/08/2019 17:55   DG Chest Port 1 View  Result Date: 02/08/2019 CLINICAL DATA:  86 year old female with fever, vomiting and altered mental status. Received 1st  COVID-19 vaccine dose yesterday. EXAM: PORTABLE CHEST 1 VIEW COMPARISON:  Portable chest 07/03/2018 and earlier. FINDINGS: Portable AP semi upright view at 1631 hours. Stable low lung volumes. Mediastinal contours remain normal. Visualized tracheal air column is within normal limits. Chronic elevation of the right hemidiaphragm. No pneumothorax, pulmonary edema. No consolidation or definite pleural effusion. Paucity of bowel gas in the upper abdomen. Stable cholecystectomy clips. Osteopenia. IMPRESSION: Chronically low lung volumes with no acute cardiopulmonary abnormality identified. Electronically Signed   By: Genevie Ann M.D.   On: 02/08/2019 16:39     I personally reviewed and interpreted all labs.      Delia Heady, PA-C 02/08/19 1904    Drenda Freeze, MD 02/08/19 2127

## 2019-02-08 NOTE — ED Provider Notes (Signed)
[facility] EMERGENCY DEPARTMENT Provider Note   CSN: [id number] Arrival date & time: 02/08/19  1341     History Chief Complaint  Patient presents with  . Altered Mental Status    Natalie Spencer is a 86 y.o. female who presents from [place] home with a past medical history of anemia, previous TBI, previous stroke,senile dementia.  EMS reports that the patient received her first coronavirus vaccine yesterday.  Since that time she has multiple episodes of emesis.  Staff tried to place IV and give Zofran however the patient pulled it out.  EMS reports that at the facility the patient was complaining of a headache.  Staff felt that she was acting abnormally.  They also felt like she might be leaning to the left side.  Patient is unable to provide any history. EMS reports that staff was unable to provide much information and did not seem to know the hx well.  HPI     Past Medical History:  Diagnosis Date  . Ambulatory dysfunction   . Hypertension   . Senile dementia (HCC)   . Stroke [facility])     Patient Active Problem List   Diagnosis Date Noted  . TBI (traumatic brain injury) (HCC) 02/08/2018  . Injury of globe of left eye 02/08/2018  . Contusion of right middle finger 02/08/2018  . Fall 02/08/2018  . Dementia (HCC) 02/08/2018  . Anemia 02/08/2018  . Bilateral lower extremity edema 02/08/2018    Past Surgical History:  Procedure Laterality Date  . CATARACT EXTRACTION, BILATERAL    . LAPAROSCOPIC CHOLECYSTECTOMY    . RUPTURED GLOBE EXPLORATION AND REPAIR Left 02/09/2018   Procedure: REPAIR OF RUPTURED GLOBE LEFT EYE;  Surgeon: Carmela Rima, MD;  Location: [facility] OR;  Service: Ophthalmology;  Laterality: Left;     OB History   No obstetric history on file.     History reviewed. No pertinent family history.  Social History   Tobacco Use  . Smoking status: Never Smoker  . Smokeless tobacco: Never Used  Substance Use Topics  . Alcohol use: Not  Currently  . Drug use: Never    Home Medications Prior to Admission medications   Medication Sig Start Date End Date Taking? Authorizing Provider  acetaminophen (TYLENOL) 325 MG tablet Take 2 tablets (650 mg total) by mouth every 4 (four) hours as needed for mild pain. 02/15/18   Barnetta Chapel, PA-C  erythromycin ophthalmic ointment Place into the left eye every 8 (eight) hours. 02/15/18   Barnetta Chapel, PA-C  traMADol (ULTRAM) 50 MG tablet Take 1 tablet (50 mg total) by mouth every 6 (six) hours as needed for moderate pain. 02/15/18   Barnetta Chapel, PA-C    Allergies    Patient has no known allergies.  Review of Systems   Review of Systems Ten systems reviewed and are negative for acute change, except as noted in the HPI.   Physical Exam Updated Vital Signs BP (!) 165/83 (BP Location: Right Arm)   Pulse 96   Temp 99.3 F (37.4 C) (Oral)   Resp 20   LMP  (LMP Unknown)   SpO2 96%   Physical Exam Vitals and nursing note reviewed.  Constitutional:      General: She is not in acute distress.    Appearance: She is well-developed. She is not diaphoretic.  HENT:     Head: Normocephalic and atraumatic.  Eyes:     General: No scleral icterus.    Conjunctiva/sclera: Conjunctivae normal.  Comments: Left pupil nonreactive with dense cataract.  Right pupil reactive to light.  Eyes move and conjugate  Cardiovascular:     Rate and Rhythm: Normal rate and regular rhythm.     Heart sounds: Normal heart sounds. No murmur. No friction rub. No gallop.   Pulmonary:     Effort: Pulmonary effort is normal. No respiratory distress.     Breath sounds: Normal breath sounds.  Abdominal:     General: Bowel sounds are normal. There is no distension.     Palpations: Abdomen is soft. There is no mass.     Tenderness: There is no abdominal tenderness. There is no guarding.  Musculoskeletal:     Cervical back: Normal range of motion.  Skin:    General: Skin is warm and dry.  Neurological:      Mental Status: Mental status is at baseline. She is disoriented.  Psychiatric:        Behavior: Behavior normal.     ED Results / Procedures / Treatments   Labs (all labs ordered are listed, but only abnormal results are displayed) Labs Reviewed  CBG MONITORING, ED - Abnormal; Notable for the following components:      Result Value   Glucose-Capillary 109 (*)    All other components within normal limits    EKG None  Radiology No results found.  Procedures Procedures (including critical care time)  Medications Ordered in ED Medications - No data to display  ED Course  I have reviewed the triage vital signs and the nursing notes.  Pertinent labs & imaging results that were available during my care of the patient were reviewed by me and considered in my medical decision making (see chart for details).  Clinical Course as of Feb 07 1937  Tue Feb 08, 2019  1533 Ammonia(!): <9 [AH]  1533 Lactic Acid, Venous: 1.4 [AH]  1533 WBC: 8.7 [AH]  1533 Glucose(!): 125 [AH]    Clinical Course User Index [AH] Margarita Mail, PA-C   MDM Rules/Calculators/A&P                     86 year old female with senile dementia 1 day status post second coronavirus vaccination here with fever, altered mental status described as "the patient is usually sweet but she is more irritable than usual."  Per nursing discussion with the patient's power of attorney.  Patient found to be febrile here.  I have reviewed the patient's labs which show CBC without leukocytosis or other abnormality.  CMP without significant abnormality.  Lactic acid within normal limits, ammonia level within normal limits.  Urine is currently pending.  Patient's CT scan of the head is also pending.  I have given signout to [place] who will assume care of the patient for appropriate discharge.  Suspect the patient has fever secondary to her Covid 19 vaccination and will be appropriate for discharge.   Final Clinical Impression(s)  / ED Diagnoses Final diagnoses:  None    Rx / DC Orders ED Discharge Orders    None       Margarita Mail, PA-C 02/08/19 1942    Quintella Reichert, MD 02/10/19 [phone]

## 2019-02-08 NOTE — ED Triage Notes (Signed)
Pt here from [facility] (nursing facility in [facility]) for emesis and altered mental status. Pt received 1st covid vaccine yesterday and has had episodes of emesis since. Per staff pt was leaning to her L side, they believed her to be altered. Pt hx of dementia, and stroke. Pt does not follow commands, only oriented to self. Pt c/o headache and dizziness. VSS.

## 2019-02-08 NOTE — Care Management (Signed)
CM reviewed record Patient will return to [facility] once medically cleared.

## 2019-02-08 NOTE — ED Notes (Signed)
Pt contact, Chevis Pretty, notified and updated on patient's status

## 2019-02-09 NOTE — ED Notes (Signed)
Assumed care of pt. Pt alert, resting on cart in NAD. Breathing easy, non-labored. Awaiting for transport to facility.

## 2020-05-08 ENCOUNTER — Emergency Department (HOSPITAL_COMMUNITY)
Admission: EM | Admit: 2020-05-08 | Discharge: 2020-05-08 | Disposition: A | Discharge status: Home / Self Care | Payer: Medicare Other | Source: Home / Self Care | Attending: Emergency Medicine | Admitting: Emergency Medicine

## 2020-05-08 ENCOUNTER — Encounter (HOSPITAL_COMMUNITY): Payer: Self-pay

## 2020-05-08 ENCOUNTER — Other Ambulatory Visit: Payer: Self-pay

## 2020-05-08 ENCOUNTER — Emergency Department (HOSPITAL_COMMUNITY): Payer: Medicare Other

## 2020-05-08 DIAGNOSIS — K920 Hematemesis: Secondary | ICD-10-CM

## 2020-05-08 DIAGNOSIS — I10 Essential (primary) hypertension: Secondary | ICD-10-CM | POA: Insufficient documentation

## 2020-05-08 DIAGNOSIS — R112 Nausea with vomiting, unspecified: Secondary | ICD-10-CM

## 2020-05-08 DIAGNOSIS — F0390 Unspecified dementia without behavioral disturbance: Secondary | ICD-10-CM | POA: Insufficient documentation

## 2020-05-08 DIAGNOSIS — K2091 Esophagitis, unspecified with bleeding: Secondary | ICD-10-CM | POA: Diagnosis not present

## 2020-05-08 HISTORY — DX: Muscle weakness (generalized): M62.81

## 2020-05-08 LAB — COMPREHENSIVE METABOLIC PANEL
ALT: 11 U/L (ref 0–44)
AST: 21 U/L (ref 15–41)
Albumin: 3.9 g/dL (ref 3.5–5.0)
Alkaline Phosphatase: 75 U/L (ref 38–126)
Anion gap: 11 (ref 5–15)
BUN: 15 mg/dL (ref 8–23)
CO2: 27 mmol/L (ref 22–32)
Calcium: 10 mg/dL (ref 8.9–10.3)
Chloride: 101 mmol/L (ref 98–111)
Creatinine, Ser: 0.82 mg/dL (ref 0.44–1.00)
GFR, Estimated: >60 mL/min (ref >60)
Glucose, Bld: 128 mg/dL — ABNORMAL HIGH (ref 70–99)
Potassium: 3.6 mmol/L (ref 3.5–5.1)
Sodium: 139 mmol/L (ref 135–145)
Total Bilirubin: 1.5 mg/dL — ABNORMAL HIGH (ref 0.3–1.2)
Total Protein: 7.6 g/dL (ref 6.5–8.1)

## 2020-05-08 LAB — CBC WITH DIFFERENTIAL/PLATELET
Abs Immature Granulocytes: 0.03 10*3/uL (ref 0.00–0.07)
Basophils Absolute: 0.1 10*3/uL (ref 0.0–0.1)
Basophils Relative: 1 %
Eosinophils Absolute: 0 10*3/uL (ref 0.0–0.5)
Eosinophils Relative: 0 %
HCT: 45.7 % (ref 36.0–46.0)
Hemoglobin: 14.1 g/dL (ref 12.0–15.0)
Immature Granulocytes: 0 %
Lymphocytes Relative: 15 %
Lymphs Abs: 1.4 10*3/uL (ref 0.7–4.0)
MCH: 28.4 pg (ref 26.0–34.0)
MCHC: 30.9 g/dL (ref 30.0–36.0)
MCV: 92.1 fL (ref 80.0–100.0)
Monocytes Absolute: 0.5 10*3/uL (ref 0.1–1.0)
Monocytes Relative: 5 %
Neutro Abs: 7.6 10*3/uL (ref 1.7–7.7)
Neutrophils Relative %: 79 %
Platelets: 381 10*3/uL (ref 150–400)
RBC: 4.96 MIL/uL (ref 3.87–5.11)
RDW: 13.6 % (ref 11.5–15.5)
WBC: 9.6 10*3/uL (ref 4.0–10.5)
nRBC: 0 % (ref 0.0–0.2)

## 2020-05-08 LAB — URINALYSIS, ROUTINE W REFLEX MICROSCOPIC
Bilirubin Urine: NEGATIVE
Glucose, UA: NEGATIVE mg/dL
Hgb urine dipstick: NEGATIVE
Ketones, ur: 5 mg/dL — AB
Leukocytes,Ua: NEGATIVE
Nitrite: NEGATIVE
Protein, ur: NEGATIVE mg/dL
Specific Gravity, Urine: >1.046 — ABNORMAL HIGH (ref 1.005–1.030)
pH: 7 (ref 5.0–8.0)

## 2020-05-08 LAB — TROPONIN I (HIGH SENSITIVITY)
Troponin I (High Sensitivity): 29 ng/L — ABNORMAL HIGH (ref <18)
Troponin I (High Sensitivity): 37 ng/L — ABNORMAL HIGH (ref <18)

## 2020-05-08 LAB — POC OCCULT BLOOD, ED: Fecal Occult Bld: NEGATIVE

## 2020-05-08 LAB — LIPASE, BLOOD: Lipase: 26 U/L (ref 11–51)

## 2020-05-08 MED ORDER — ONDANSETRON 4 MG PO TBDP: 4.0000 mg | ORAL_TABLET | Freq: Three times a day (TID) | ORAL | 0 refills | Status: AC | PRN

## 2020-05-08 MED ORDER — IOHEXOL 300 MG/ML  SOLN
100.0000 mL | Freq: Once | INTRAMUSCULAR | Status: AC | PRN
  Administered 2020-05-08: 100 mL via INTRAVENOUS

## 2020-05-08 MED ORDER — PANTOPRAZOLE SODIUM 20 MG PO TBEC: 40.0000 mg | DELAYED_RELEASE_TABLET | Freq: Every day | ORAL | 0 refills | Status: DC

## 2020-05-08 NOTE — ED Provider Notes (Signed)
[facility] EMERGENCY DEPARTMENT Provider Note   CSN: [id number] Arrival date & time: 05/08/20  1449     History Chief Complaint  Patient presents with  . Hematemesis    Natalie Spencer is a 87 y.o. female.  HPI      87 year old female with history of hypertension, CVA, dementia presents with concern for nausea, vomiting and hematemesis.   Vomiting yesterday and today, one episode yesterday and 2 today, second was dark today and positive for blood on gastroccult, coffee ground appearance No notes regarding fever, headache, cough, dyspnea, pain, diarrhea. No falls in last week. Guardian reports she usually will not complain of pain however.  Discussed with [place] and POA at bedside [employer])   History is limited by dementia  Patient is very pleasant, talkative, denies concerns   Past Medical History:  Diagnosis Date  . Ambulatory dysfunction   . Hypertension   . Muscle weakness (generalized)   . Senile dementia (HCC)   . Stroke [facility])     Patient Active Problem List   Diagnosis Date Noted  . TBI (traumatic brain injury) (HCC) 02/08/2018  . Injury of globe of left eye 02/08/2018  . Contusion of right middle finger 02/08/2018  . Fall 02/08/2018  . Dementia (HCC) 02/08/2018  . Anemia 02/08/2018  . Bilateral lower extremity edema 02/08/2018    Past Surgical History:  Procedure Laterality Date  . CATARACT EXTRACTION, BILATERAL    . LAPAROSCOPIC CHOLECYSTECTOMY    . RUPTURED GLOBE EXPLORATION AND REPAIR Left 02/09/2018   Procedure: REPAIR OF RUPTURED GLOBE LEFT EYE;  Surgeon: Carmela Rima, MD;  Location: [facility] OR;  Service: Ophthalmology;  Laterality: Left;     OB History   No obstetric history on file.     No family history on file.  Social History   Tobacco Use  . Smoking status: Never Smoker  . Smokeless tobacco: Never Used  Substance Use Topics  . Alcohol use: Not Currently  . Drug use: Never    Home  Medications Prior to Admission medications   Medication Sig Start Date End Date Taking? Authorizing Provider  ondansetron (ZOFRAN ODT) 4 MG disintegrating tablet Take 1 tablet (4 mg total) by mouth every 8 (eight) hours as needed for nausea or vomiting. 05/08/20  Yes Alvira Monday, MD  pantoprazole (PROTONIX) 20 MG tablet Take 2 tablets (40 mg total) by mouth daily for 14 days. 05/08/20 05/22/20 Yes Alvira Monday, MD  acetaminophen (TYLENOL) 325 MG tablet Take 2 tablets (650 mg total) by mouth every 4 (four) hours as needed for mild pain. 02/15/18   Barnetta Chapel, PA-C  erythromycin ophthalmic ointment Place into the left eye every 8 (eight) hours. 02/15/18   Barnetta Chapel, PA-C  traMADol (ULTRAM) 50 MG tablet Take 1 tablet (50 mg total) by mouth every 6 (six) hours as needed for moderate pain. 02/15/18   Barnetta Chapel, PA-C    Allergies    Patient has no known allergies.  Review of Systems   Review of Systems  Unable to perform ROS: Dementia  Respiratory: Negative for cough.   Cardiovascular: Negative for chest pain.  Gastrointestinal: Positive for nausea and vomiting. Negative for diarrhea.  Neurological: Negative for headaches.    Physical Exam Updated Vital Signs BP (!) 146/88 (BP Location: Left Arm)   Pulse 78   Temp 98.4 F (36.9 C) (Oral)   Resp 18   Ht 5' (1.524 m)   Wt 54.4 kg   LMP  (LMP Unknown)  SpO2 94%   BMI 23.44 kg/m   Physical Exam Vitals and nursing note reviewed.  Constitutional:      General: She is not in acute distress.    Appearance: She is well-developed. She is not diaphoretic.  HENT:     Head: Normocephalic and atraumatic.  Eyes:     Conjunctiva/sclera: Conjunctivae normal.  Cardiovascular:     Rate and Rhythm: Normal rate and regular rhythm.     Heart sounds: Normal heart sounds. No murmur heard. No friction rub. No gallop.   Pulmonary:     Effort: Pulmonary effort is normal. No respiratory distress.     Breath sounds: Normal breath  sounds. No wheezing or rales.  Abdominal:     General: There is distension.     Palpations: Abdomen is soft.     Tenderness: There is no abdominal tenderness. There is no guarding.  Musculoskeletal:        General: No tenderness.     Cervical back: Normal range of motion.  Skin:    General: Skin is warm and dry.     Findings: No erythema or rash.  Neurological:     Mental Status: She is alert.     Comments: Oriented to self, at baseline     ED Results / Procedures / Treatments   Labs (all labs ordered are listed, but only abnormal results are displayed) Labs Reviewed  COMPREHENSIVE METABOLIC PANEL - Abnormal; Notable for the following components:      Result Value   Glucose, Bld 128 (*)    Total Bilirubin 1.5 (*)    All other components within normal limits  URINALYSIS, ROUTINE W REFLEX MICROSCOPIC - Abnormal; Notable for the following components:   Specific Gravity, Urine >1.046 (*)    Ketones, ur 5 (*)    All other components within normal limits  TROPONIN I (HIGH SENSITIVITY) - Abnormal; Notable for the following components:   Troponin I (High Sensitivity) 29 (*)    All other components within normal limits  TROPONIN I (HIGH SENSITIVITY) - Abnormal; Notable for the following components:   Troponin I (High Sensitivity) 37 (*)    All other components within normal limits  URINE CULTURE  CBC WITH DIFFERENTIAL/PLATELET  LIPASE, BLOOD  POC OCCULT BLOOD, ED  TYPE AND SCREEN    EKG EKG Interpretation  Date/Time:  Tuesday May 08 2020 14:55:24 EDT Ventricular Rate:  89 PR Interval:  154 QRS Duration: 84 QT Interval:  377 QTC Calculation: 459 R Axis:   -17 Text Interpretation: Sinus rhythm Probable inferior infarct, age indeterminate No significant change since last tracing Confirmed by Alvira Monday ([id number]) on 05/08/2020 3:01:12 PM Also confirmed by Alvira Monday ([id number]), editor Farley, LaVerne [phone])  on 05/09/2020 9:51:45 AM   Radiology CT ABDOMEN PELVIS  W CONTRAST  Result Date: 05/08/2020 CLINICAL DATA:  Vomiting with hematemesis for 2 days. Possible bowel obstruction. EXAM: CT ABDOMEN AND PELVIS WITH CONTRAST TECHNIQUE: Multidetector CT imaging of the abdomen and pelvis was performed using the standard protocol following bolus administration of intravenous contrast. CONTRAST:  OMNIPAQUE IOHEXOL 300 MG/ML  SOLN COMPARISON:  07/03/2018 FINDINGS: Lower Chest: No acute findings. Hepatobiliary: No hepatic masses identified. Prior cholecystectomy. No evidence of biliary obstruction. Pancreas:  No mass or inflammatory changes. Spleen: Within normal limits in size and appearance. Adrenals/Urinary Tract: No masses identified. No evidence of ureteral calculi or hydronephrosis. Stomach/Bowel: New moderate sized paraesophageal hiatal hernia is seen. No evidence of obstruction, inflammatory process or abnormal fluid collections.  Normal appendix visualized. Diverticulosis is seen mainly involving the sigmoid colon, however there is no evidence of diverticulitis. Stable tiny paraumbilical ventral hernia containing only fat. Vascular/Lymphatic: No pathologically enlarged lymph nodes. No acute vascular findings. Aortic atherosclerotic calcification noted. Reproductive:  No mass or other significant abnormality. Other:  None. Musculoskeletal: No suspicious bone lesions identified. Old L4 vertebral body compression fracture again noted. IMPRESSION: No evidence of bowel obstruction. New moderate paraesophageal hiatal hernia. Stable tiny paraumbilical ventral hernia, which contains only fat. Colonic diverticulosis, without radiographic evidence of diverticulitis. Aortic Atherosclerosis (ICD10-I70.0). Electronically Signed   By: Danae Orleans M.D.   On: 05/08/2020 18:40    Procedures Procedures   Medications Ordered in ED Medications  iohexol (OMNIPAQUE) 300 MG/ML solution 100 mL (100 mLs Intravenous Contrast Given 05/08/20 1754)    ED Course  I have reviewed the  triage vital signs and the nursing notes.  Pertinent labs & imaging results that were available during my care of the patient were reviewed by me and considered in my medical decision making (see chart for details).    MDM Rules/Calculators/A&P                           87 year old female with history of hypertension, CVA, dementia presents with concern for nausea, vomiting and hematemesis.  CT abdomen shows no evidence of bowel obstruction, does show new paresophageal hiatal hernia and paraumbilical ventral hernia containing fat. EKG without acute changes, troponins mild elevation with change less than 10 and no report of chest pain per facility and do not feel this mild elevation at her age without significant change represents ACS.  No sign of UTI.  Hgb normal, no melena on exam, no episodes of emesis or coffee ground emesis in the ED.  Labs otherwise without signs of hepatitis, pancreatitis, or other acute abnormalities.  Suspect likely gastritis.  Given rx for protonix, zofran and recommend PCP follow up. Patient discharged in stable condition with understanding of reasons to return.    Final Clinical Impression(s) / ED Diagnoses Final diagnoses:  Hematemesis with nausea  Non-intractable vomiting with nausea, unspecified vomiting type    Rx / DC Orders ED Discharge Orders         Ordered    pantoprazole (PROTONIX) 20 MG tablet  Daily        05/08/20 2150    ondansetron (ZOFRAN ODT) 4 MG disintegrating tablet  Every 8 hours PRN        05/08/20 2150           Alvira Monday, MD 05/09/20 1051

## 2020-05-08 NOTE — ED Notes (Signed)
Patient transported to CT 

## 2020-05-08 NOTE — ED Notes (Signed)
[facility] called, pt 3rd on list

## 2020-05-08 NOTE — ED Notes (Signed)
Sitter at the bedside.

## 2020-05-08 NOTE — ED Notes (Signed)
Pt unable to sign MSE due to having dementia and no family is present.

## 2020-05-08 NOTE — ED Triage Notes (Addendum)
Pt arrived via [facility] from [place] at [facility] for vomitingx2 days. Pt had a one time episode of hematemesis today and hasn't vomited since. Nursing home staff did hemoccult on vomit and it was + for blood. Pt is A&Ox1 to self only. Pt is NSR on monitor. VSS. Pt denies abdominal pain or nausea at present.

## 2020-05-09 ENCOUNTER — Emergency Department (HOSPITAL_COMMUNITY): Payer: Medicare Other

## 2020-05-09 ENCOUNTER — Encounter (HOSPITAL_COMMUNITY): Payer: Self-pay | Admitting: Emergency Medicine

## 2020-05-09 ENCOUNTER — Inpatient Hospital Stay (HOSPITAL_COMMUNITY)
Admission: EM | Admit: 2020-05-09 | Discharge: 2020-05-12 | DRG: 369 | Disposition: A | Discharge status: Skilled Nursing Facility | Payer: Medicare Other | Source: Skilled Nursing Facility | Attending: Internal Medicine | Admitting: Internal Medicine

## 2020-05-09 DIAGNOSIS — E860 Dehydration: Secondary | ICD-10-CM | POA: Diagnosis present

## 2020-05-09 DIAGNOSIS — M6281 Muscle weakness (generalized): Secondary | ICD-10-CM | POA: Diagnosis present

## 2020-05-09 DIAGNOSIS — E872 Acidosis, unspecified: Secondary | ICD-10-CM | POA: Diagnosis present

## 2020-05-09 DIAGNOSIS — K2100 Gastro-esophageal reflux disease with esophagitis, without bleeding: Secondary | ICD-10-CM

## 2020-05-09 DIAGNOSIS — K920 Hematemesis: Secondary | ICD-10-CM | POA: Diagnosis not present

## 2020-05-09 DIAGNOSIS — I10 Essential (primary) hypertension: Secondary | ICD-10-CM | POA: Diagnosis not present

## 2020-05-09 DIAGNOSIS — E876 Hypokalemia: Secondary | ICD-10-CM | POA: Diagnosis present

## 2020-05-09 DIAGNOSIS — F0390 Unspecified dementia without behavioral disturbance: Secondary | ICD-10-CM | POA: Diagnosis present

## 2020-05-09 DIAGNOSIS — R112 Nausea with vomiting, unspecified: Secondary | ICD-10-CM | POA: Diagnosis present

## 2020-05-09 DIAGNOSIS — Z8673 Personal history of transient ischemic attack (TIA), and cerebral infarction without residual deficits: Secondary | ICD-10-CM

## 2020-05-09 DIAGNOSIS — K2091 Esophagitis, unspecified with bleeding: Principal | ICD-10-CM | POA: Diagnosis present

## 2020-05-09 DIAGNOSIS — R000 Tachycardia, unspecified: Secondary | ICD-10-CM | POA: Diagnosis present

## 2020-05-09 DIAGNOSIS — K922 Gastrointestinal hemorrhage, unspecified: Secondary | ICD-10-CM | POA: Diagnosis present

## 2020-05-09 DIAGNOSIS — Z66 Do not resuscitate: Secondary | ICD-10-CM | POA: Diagnosis present

## 2020-05-09 DIAGNOSIS — D649 Anemia, unspecified: Secondary | ICD-10-CM | POA: Diagnosis present

## 2020-05-09 DIAGNOSIS — K5730 Diverticulosis of large intestine without perforation or abscess without bleeding: Secondary | ICD-10-CM | POA: Diagnosis present

## 2020-05-09 DIAGNOSIS — Z20822 Contact with and (suspected) exposure to covid-19: Secondary | ICD-10-CM | POA: Diagnosis present

## 2020-05-09 DIAGNOSIS — D72829 Elevated white blood cell count, unspecified: Secondary | ICD-10-CM | POA: Diagnosis present

## 2020-05-09 DIAGNOSIS — K449 Diaphragmatic hernia without obstruction or gangrene: Secondary | ICD-10-CM

## 2020-05-09 DIAGNOSIS — Z6823 Body mass index (BMI) 23.0-23.9, adult: Secondary | ICD-10-CM

## 2020-05-09 DIAGNOSIS — R54 Age-related physical debility: Secondary | ICD-10-CM | POA: Diagnosis present

## 2020-05-09 LAB — HEMOGLOBIN AND HEMATOCRIT, BLOOD
HCT: 47.8 % — ABNORMAL HIGH (ref 36.0–46.0)
Hemoglobin: 15.1 g/dL — ABNORMAL HIGH (ref 12.0–15.0)

## 2020-05-09 LAB — TYPE AND SCREEN
ABO/RH(D): O POS
ABO/RH(D): O POS
Antibody Screen: NEGATIVE
Antibody Screen: NEGATIVE

## 2020-05-09 LAB — CBC WITH DIFFERENTIAL/PLATELET
Abs Immature Granulocytes: 0.05 10*3/uL (ref 0.00–0.07)
Basophils Absolute: 0.1 10*3/uL (ref 0.0–0.1)
Basophils Relative: 1 %
Eosinophils Absolute: 0 10*3/uL (ref 0.0–0.5)
Eosinophils Relative: 0 %
HCT: 48.4 % — ABNORMAL HIGH (ref 36.0–46.0)
Hemoglobin: 14.9 g/dL (ref 12.0–15.0)
Immature Granulocytes: 0 %
Lymphocytes Relative: 18 %
Lymphs Abs: 2.6 10*3/uL (ref 0.7–4.0)
MCH: 28.5 pg (ref 26.0–34.0)
MCHC: 30.8 g/dL (ref 30.0–36.0)
MCV: 92.7 fL (ref 80.0–100.0)
Monocytes Absolute: 0.6 10*3/uL (ref 0.1–1.0)
Monocytes Relative: 5 %
Neutro Abs: 10.9 10*3/uL — ABNORMAL HIGH (ref 1.7–7.7)
Neutrophils Relative %: 76 %
Platelets: 329 10*3/uL (ref 150–400)
RBC: 5.22 MIL/uL — ABNORMAL HIGH (ref 3.87–5.11)
RDW: 13.4 % (ref 11.5–15.5)
WBC: 14.3 10*3/uL — ABNORMAL HIGH (ref 4.0–10.5)
nRBC: 0 % (ref 0.0–0.2)

## 2020-05-09 LAB — RESP PANEL BY RT-PCR (FLU A&B, COVID) ARPGX2
Influenza A by PCR: NEGATIVE
Influenza B by PCR: NEGATIVE
SARS Coronavirus 2 by RT PCR: NEGATIVE

## 2020-05-09 LAB — COMPREHENSIVE METABOLIC PANEL
ALT: 12 U/L (ref 0–44)
AST: 23 U/L (ref 15–41)
Albumin: 4.2 g/dL (ref 3.5–5.0)
Alkaline Phosphatase: 80 U/L (ref 38–126)
Anion gap: 12 (ref 5–15)
BUN: 16 mg/dL (ref 8–23)
CO2: 25 mmol/L (ref 22–32)
Calcium: 10.2 mg/dL (ref 8.9–10.3)
Chloride: 104 mmol/L (ref 98–111)
Creatinine, Ser: 0.93 mg/dL (ref 0.44–1.00)
GFR, Estimated: 58 mL/min — ABNORMAL LOW (ref >60)
Glucose, Bld: 102 mg/dL — ABNORMAL HIGH (ref 70–99)
Potassium: 3.3 mmol/L — ABNORMAL LOW (ref 3.5–5.1)
Sodium: 141 mmol/L (ref 135–145)
Total Bilirubin: 2 mg/dL — ABNORMAL HIGH (ref 0.3–1.2)
Total Protein: 8.5 g/dL — ABNORMAL HIGH (ref 6.5–8.1)

## 2020-05-09 LAB — FERRITIN: Ferritin: 49 ng/mL (ref 11–307)

## 2020-05-09 LAB — LIPASE, BLOOD: Lipase: 27 U/L (ref 11–51)

## 2020-05-09 LAB — LACTIC ACID, PLASMA
Lactic Acid, Venous: 2 mmol/L (ref 0.5–1.9)
Lactic Acid, Venous: 2.6 mmol/L (ref 0.5–1.9)

## 2020-05-09 LAB — IRON AND TIBC
Iron: 33 ug/dL (ref 28–170)
Saturation Ratios: 11 % (ref 10.4–31.8)
TIBC: 312 ug/dL (ref 250–450)
UIBC: 279 ug/dL

## 2020-05-09 LAB — MAGNESIUM: Magnesium: 2.2 mg/dL (ref 1.7–2.4)

## 2020-05-09 LAB — PROTIME-INR
INR: 1 (ref 0.8–1.2)
Prothrombin Time: 13.4 seconds (ref 11.4–15.2)

## 2020-05-09 MED ORDER — LACTATED RINGERS IV SOLN: INTRAVENOUS | Status: AC

## 2020-05-09 MED ORDER — ONDANSETRON HCL 4 MG/2ML IJ SOLN
4.0000 mg | Freq: Once | INTRAMUSCULAR | Status: AC
  Administered 2020-05-09: 4 mg via INTRAVENOUS
  Filled 2020-05-09: qty 2

## 2020-05-09 MED ORDER — ACETAMINOPHEN 650 MG RE SUPP
650.0000 mg | Freq: Four times a day (QID) | RECTAL | Status: DC | PRN
  Administered 2020-05-10: 650 mg via RECTAL
  Filled 2020-05-09: qty 1

## 2020-05-09 MED ORDER — HYDRALAZINE HCL 20 MG/ML IJ SOLN: 10.0000 mg | INTRAMUSCULAR | Status: DC | PRN

## 2020-05-09 MED ORDER — SODIUM CHLORIDE 0.9 % IV SOLN
8.0000 mg/h | INTRAVENOUS | Status: DC
  Administered 2020-05-09 – 2020-05-11 (×5): 8 mg/h via INTRAVENOUS
  Filled 2020-05-09 (×8): qty 80

## 2020-05-09 MED ORDER — ONDANSETRON HCL 4 MG/2ML IJ SOLN
4.0000 mg | Freq: Four times a day (QID) | INTRAMUSCULAR | Status: DC | PRN
  Administered 2020-05-09: 4 mg via INTRAVENOUS
  Filled 2020-05-09: qty 2

## 2020-05-09 MED ORDER — SODIUM CHLORIDE 0.9 % IV BOLUS
1000.0000 mL | Freq: Once | INTRAVENOUS | Status: AC
  Administered 2020-05-09: 1000 mL via INTRAVENOUS

## 2020-05-09 MED ORDER — LORAZEPAM 2 MG/ML IJ SOLN
0.5000 mg | Freq: Four times a day (QID) | INTRAMUSCULAR | Status: DC | PRN
  Administered 2020-05-09: 0.5 mg via INTRAVENOUS
  Filled 2020-05-09: qty 1

## 2020-05-09 MED ORDER — ACETAMINOPHEN 325 MG PO TABS: 650.0000 mg | ORAL_TABLET | Freq: Four times a day (QID) | ORAL | Status: DC | PRN

## 2020-05-09 MED ORDER — FAMOTIDINE IN NACL 20-0.9 MG/50ML-% IV SOLN
20.0000 mg | Freq: Once | INTRAVENOUS | Status: DC
  Filled 2020-05-09: qty 50

## 2020-05-09 NOTE — ED Notes (Signed)
Pt pulled IV out and is becoming more agitated.

## 2020-05-09 NOTE — ED Triage Notes (Signed)
Emergency Medicine Provider Triage Evaluation Note  Natalie Spencer , a 87 y.o. female  was evaluated in triage.  Pt complains of vomiting bright red blood today.  She was seen in the ED yesterday for same with a negative work-up and stable hemoglobin and sent back to the facility.  History significantly limited secondary to patient's dementia.  She is alert to self.  Does report she is having some abdominal pain.  Review of Systems  Positive: + hematemesis, + abdominal pain Negative: - diarrhea  Physical Exam  BP (!) 152/112 (BP Location: Right Arm)   Pulse (!) 107   Temp 98.6 F (37 C) (Oral)   Resp 20   LMP  (LMP Unknown)   SpO2 99%  Gen:   Awake, no distress   HEENT:  Atraumatic  Resp:  Normal effort  Cardiac:  Normal rate  Abd:   Nondistended, nontender  MSK:   Moves extremities without difficulty  Neuro:  Speech clear   Medical Decision Making  Medically screening exam initiated at 9:25 AM.  Appropriate orders placed.  Natalie Spencer was informed that the remainder of the evaluation will be completed by another provider, this initial triage assessment does not replace that evaluation, and the importance of remaining in the ED until their evaluation is complete.  Clinical Impression  87 year old returning to the ED today with complaint of bright red emesis. Seen yesterday in the ED for same. Vitals signs with elevated HR 107 today. Pt unable to provide any additional history at this time. Facility will need to be contacted for additional information.    Tanda Rockers, PA-C 05/09/20 [phone]

## 2020-05-09 NOTE — ED Provider Notes (Addendum)
[facility] EMERGENCY DEPARTMENT Provider Note   CSN: [id number] Arrival date & time: 05/09/20  6195     History No chief complaint on file.   Natalie Spencer is a 87 y.o. female.  HPI   87 year old female with past medical history of TBI, dementia, chronic anemia, HTN presents to the emergency department with concern for nausea/vomiting and hematemesis.  Of note patient was seen here yesterday with the same complaint.  Her work-up was unremarkable including a CT scan of the abdomen pelvis and she was discharged back to facility with oral meds.  Patient is pleasantly confused, very sweet.  But a poor historian.  Currently she points to her mid upper abdomen as to where there is discomfort. Has had active vomiting on arrival.  Past Medical History:  Diagnosis Date  . Ambulatory dysfunction   . Hypertension   . Muscle weakness (generalized)   . Senile dementia (HCC)   . Stroke [facility])     Patient Active Problem List   Diagnosis Date Noted  . TBI (traumatic brain injury) (HCC) 02/08/2018  . Injury of globe of left eye 02/08/2018  . Contusion of right middle finger 02/08/2018  . Fall 02/08/2018  . Dementia (HCC) 02/08/2018  . Anemia 02/08/2018  . Bilateral lower extremity edema 02/08/2018    Past Surgical History:  Procedure Laterality Date  . CATARACT EXTRACTION, BILATERAL    . LAPAROSCOPIC CHOLECYSTECTOMY    . RUPTURED GLOBE EXPLORATION AND REPAIR Left 02/09/2018   Procedure: REPAIR OF RUPTURED GLOBE LEFT EYE;  Surgeon: Carmela Rima, MD;  Location: [facility];  Service: Ophthalmology;  Laterality: Left;     OB History   No obstetric history on file.     History reviewed. No pertinent family history.  Social History   Tobacco Use  . Smoking status: Never Smoker  . Smokeless tobacco: Never Used  Substance Use Topics  . Alcohol use: Not Currently  . Drug use: Never    Home Medications Prior to Admission medications   Medication Sig Start Date End  Date Taking? Authorizing Provider  acetaminophen (TYLENOL) 325 MG tablet Take 2 tablets (650 mg total) by mouth every 4 (four) hours as needed for mild pain. 02/15/18   Barnetta Chapel, PA-C  erythromycin ophthalmic ointment Place into the left eye every 8 (eight) hours. 02/15/18   Barnetta Chapel, PA-C  ondansetron (ZOFRAN ODT) 4 MG disintegrating tablet Take 1 tablet (4 mg total) by mouth every 8 (eight) hours as needed for nausea or vomiting. 05/08/20   Alvira Monday, MD  pantoprazole (PROTONIX) 20 MG tablet Take 2 tablets (40 mg total) by mouth daily for 14 days. 05/08/20 05/22/20  Alvira Monday, MD  traMADol (ULTRAM) 50 MG tablet Take 1 tablet (50 mg total) by mouth every 6 (six) hours as needed for moderate pain. 02/15/18   Barnetta Chapel, PA-C    Allergies    Patient has no known allergies.  Review of Systems   Review of Systems  Unable to perform ROS: Dementia    Physical Exam Updated Vital Signs BP (!) 183/112   Pulse (!) 109   Temp 98.6 F (37 C) (Oral)   Resp 20   LMP  (LMP Unknown)   SpO2 97%   Physical Exam Vitals and nursing note reviewed.  Constitutional:      Appearance: Normal appearance.  HENT:     Head: Normocephalic.     Mouth/Throat:     Mouth: Mucous membranes are moist.  Cardiovascular:  Rate and Rhythm: Normal rate.  Pulmonary:     Effort: Pulmonary effort is normal. No respiratory distress.  Abdominal:     Palpations: Abdomen is soft. There is no mass.     Tenderness: There is no abdominal tenderness. There is no guarding or rebound.     Hernia: No hernia is present.  Skin:    General: Skin is warm.  Neurological:     Mental Status: She is alert and oriented to person, place, and time. Mental status is at baseline.  Psychiatric:        Mood and Affect: Mood normal.     ED Results / Procedures / Treatments   Labs (all labs ordered are listed, but only abnormal results are displayed) Labs Reviewed  COMPREHENSIVE METABOLIC PANEL - Abnormal;  Notable for the following components:      Result Value   Potassium 3.3 (*)    Glucose, Bld 102 (*)    Total Protein 8.5 (*)    Total Bilirubin 2.0 (*)    GFR, Estimated 58 (*)    All other components within normal limits  CBC WITH DIFFERENTIAL/PLATELET - Abnormal; Notable for the following components:   WBC 14.3 (*)    RBC 5.22 (*)    HCT 48.4 (*)    Neutro Abs 10.9 (*)    All other components within normal limits  LIPASE, BLOOD    EKG None  Radiology CT ABDOMEN PELVIS W CONTRAST  Result Date: 05/08/2020 CLINICAL DATA:  Vomiting with hematemesis for 2 days. Possible bowel obstruction. EXAM: CT ABDOMEN AND PELVIS WITH CONTRAST TECHNIQUE: Multidetector CT imaging of the abdomen and pelvis was performed using the standard protocol following bolus administration of intravenous contrast. CONTRAST:  OMNIPAQUE IOHEXOL 300 MG/ML  SOLN COMPARISON:  07/03/2018 FINDINGS: Lower Chest: No acute findings. Hepatobiliary: No hepatic masses identified. Prior cholecystectomy. No evidence of biliary obstruction. Pancreas:  No mass or inflammatory changes. Spleen: Within normal limits in size and appearance. Adrenals/Urinary Tract: No masses identified. No evidence of ureteral calculi or hydronephrosis. Stomach/Bowel: New moderate sized paraesophageal hiatal hernia is seen. No evidence of obstruction, inflammatory process or abnormal fluid collections. Normal appendix visualized. Diverticulosis is seen mainly involving the sigmoid colon, however there is no evidence of diverticulitis. Stable tiny paraumbilical ventral hernia containing only fat. Vascular/Lymphatic: No pathologically enlarged lymph nodes. No acute vascular findings. Aortic atherosclerotic calcification noted. Reproductive:  No mass or other significant abnormality. Other:  None. Musculoskeletal: No suspicious bone lesions identified. Old L4 vertebral body compression fracture again noted. IMPRESSION: No evidence of bowel obstruction. New  moderate paraesophageal hiatal hernia. Stable tiny paraumbilical ventral hernia, which contains only fat. Colonic diverticulosis, without radiographic evidence of diverticulitis. Aortic Atherosclerosis (ICD10-I70.0). Electronically Signed   By: Danae Orleans M.D.   On: 05/08/2020 18:40    Procedures Ultrasound ED Peripheral IV (Provider)  Date/Time: 05/09/2020 1:16 PM Performed by: Rozelle Logan, DO Authorized by: Rozelle Logan, DO   Procedure details:    Indications: hydration, multiple failed IV attempts and poor IV access     Skin Prep: chlorhexidine gluconate     Location:  Right AC   Angiocath:  20 G   Bedside Ultrasound Guided: Yes     Images: not archived     Patient tolerated procedure without complications: Yes     Dressing applied: Yes       Medications Ordered in ED Medications  famotidine (PEPCID) IVPB 20 mg premix (has no administration in time range)  sodium chloride 0.9 % bolus 1,000 mL (1,000 mLs Intravenous New Bag/Given 05/09/20 1238)  ondansetron (ZOFRAN) injection 4 mg (4 mg Intravenous Given 05/09/20 1305)    ED Course  I have reviewed the triage vital signs and the nursing notes.  Pertinent labs & imaging results that were available during my care of the patient were reviewed by me and considered in my medical decision making (see chart for details).    MDM Rules/Calculators/A&P                          87 year old female presents the emergency department with ongoing nausea/vomiting, hematemesis and epigastric abdominal pain.  She is tachycardic on arrival.  Blood work shows a leukocytosis and elevated bilirubin when compared to yesterday.  For this reason a repeat CAT scan will be done.  She is being hydrated and treated symptomatically.  She is hypertensive but it was reported that she did not tolerate her oral hypertensive medication.  Patient has a designated guardian, who is currently at bedside.  Patient's IV infiltrated at CAT scan, the CAT  scan that was done did not show any acute finding when compared to yesterday.  IV team has been unsuccessful in getting their first line, the second team is get a try for a midline or PICC line.  Patient continues to be tachycardic, even after Zofran has had an episode of emesis.  Plan for admission for further hydration, treatment and possible GI evaluation. Consult call for GI pending. Patients evaluation and results requires admission for further treatment and care. Patient agrees with admission plan, offers no new complaints and is stable/unchanged at time of admit.  Final Clinical Impression(s) / ED Diagnoses Final diagnoses:  None    Rx / DC Orders ED Discharge Orders    None       Rozelle Logan, DO 05/09/20 1501    Rozelle Logan, DO 05/09/20 1530

## 2020-05-09 NOTE — H&P (View-Only) (Signed)
[facility] Consult: 4:02 PM 05/09/2020  LOS: 0 days    Referring Provider: ED physician, Dr. Royce Macadamia Primary Care Physician:  Patient, No Pcp Per (Inactive) Primary Gastroenterologist:  Dr. Marlynn Perking    Reason for Consultation: Hematemesis   HPI: Dakota Stangl is a 87 y.o. female.  PMH dementia.  Gallbladder surgically absent.  CVA.  Anemia.  Epic records of any GI scopes. [facility]ident at SNF Seen at ED yesterday for emesis that began the day prior.  1 episode the day before to that day, second episode yesterday was dark, CGE appearing and tested Gastroccult positive.   CTAP showed a new paraesophageal hiatal hernia, tiny periumbilical ventral hernia containing fat, colonic diverticulosis, aortic atherosclerosis..  Hgb 14.1, MCV 92.  lipase normal.  T bili minimally elevated at 1.5 but otherwise normal LFTs.  Urinalysis bland.  Troponins were minimally elevated but no ischemic changes on EKG..  No melena on exam, no repeats of the CGE.  Vital signs stable.  Provided with prescription for Protonix, Zofran and PCP follow-up recommended at discharge from the ED.  Prior to yesterday she had not been on any PPI or H2 blocker.  There is been no abdominal pain.  Previous appetite unknown.  No reports of melena or bloody stools.  She returned to the ED today recurrent emesis, now vomiting bright red blood. BP is actually hypertensive, heart rate in the low 100s to 1 teens.  Oxygen saturations on room air anywhere from 94 to 100%. Hgb 14.9.  WBCs, previously normal are 14.3.  BUN and creatinine remain normal.  T bili 2.0 and alk phos, transaminases still normal. CTAP with contrast repeated, no new abdominal pelvic, chest findings.  Unable to obtain family history or social history on this demented patient.    Past Medical  History:  Diagnosis Date  . Ambulatory dysfunction   . Hypertension   . Muscle weakness (generalized)   . Senile dementia ([place])   . Stroke [facility])     Past Surgical History:  Procedure Laterality Date  . CATARACT EXTRACTION, BILATERAL    . LAPAROSCOPIC CHOLECYSTECTOMY    . RUPTURED GLOBE EXPLORATION AND REPAIR Left 02/09/2018   Procedure: REPAIR OF RUPTURED GLOBE LEFT EYE;  Surgeon: Jalene Mullet, MD;  Location: [place];  Service: Ophthalmology;  Laterality: Left;    Prior to Admission medications   Medication Sig Start Date End Date Taking? Authorizing Provider  acetaminophen (TYLENOL) 325 MG tablet Take 2 tablets (650 mg total) by mouth every 4 (four) hours as needed for mild pain. 02/15/18   Saverio Danker, PA-C  erythromycin ophthalmic ointment Place into the left eye every 8 (eight) hours. 02/15/18   Saverio Danker, PA-C  ondansetron (ZOFRAN ODT) 4 MG disintegrating tablet Take 1 tablet (4 mg total) by mouth every 8 (eight) hours as needed for nausea or vomiting. 05/08/20   Gareth Morgan, MD  pantoprazole (PROTONIX) 20 MG tablet Take 2 tablets (40 mg total) by mouth daily for 14 days. 05/08/20 05/22/20  Gareth Morgan, MD  traMADol (ULTRAM) 50 MG tablet Take 1 tablet (  50 mg total) by mouth every 6 (six) hours as needed for moderate pain. 02/15/18   Saverio Danker, PA-C    Scheduled Meds:  Infusions: . pantoprozole (PROTONIX) infusion     PRN Meds: acetaminophen **OR** acetaminophen   Allergies as of 05/09/2020  . (No Known Allergies)    History reviewed. No pertinent family history.  Social History   Socioeconomic History  . Marital status: Widowed    Spouse name: Not on file  . Number of children: Not on file  . Years of education: Not on file  . Highest education level: Not on file  Occupational History  . Not on file  Tobacco Use  . Smoking status: Never Smoker  . Smokeless tobacco: Never Used  Substance and Sexual Activity  . Alcohol use: Not Currently  .  Drug use: Never  . Sexual activity: Not on file  Other Topics Concern  . Not on file  Social History Narrative  . Not on file   Social Determinants of Health   Financial Resource Strain: Not on file  Food Insecurity: Not on file  Transportation Needs: Not on file  Physical Activity: Not on file  Stress: Not on file  Social Connections: Not on file  Intimate Partner Violence: Not on file    REVIEW OF SYSTEMS: Unable to perform ROS due to patient's dementia.   PHYSICAL EXAM: Vital signs in last 24 hours: Vitals:   05/09/20 1305 05/09/20 1430  BP: (!) 139/113 (!) 161/100  Pulse: (!) 114 (!) 110  Resp: (!) 22 (!) 22  Temp:    SpO2: 94% 96%   Wt Readings from Last 3 Encounters:  05/08/20 54.4 kg    General: Patient is aged, moderately frail appearing, alert and conversant but her speech does not correspond with the discussion at hand or follow logic. Head: No facial asymmetry or swelling.  No signs of head trauma. Eyes: Scleral icterus.  No conjunctival pallor.   Ears: Not hard of hearing Nose: No congestion or discharge Mouth: Many absent teeth.  Mucosa is moist, pink, clear.  Tongue midline.  No old or fresh blood Neck: No JVD, masses, thyromegaly Lungs: Clear bilaterally though breath sounds reduced.  No labored breathing or cough Heart: RRR.  No MRG.  S1, S2 present Abdomen:   Soft without tenderness.  Bowel sounds hypoactive.  No HSM, masses, bruits, hernias..   Rectal: Deferred. Musc/Skeltl: Kyphosis of the spine.  No erythema or swelling in joints in the limbs. Extremities: No CCE. Neurologic: Pleasant, fluid speech.  Moves all 4 limbs but unable to cooperate for strength testing.  No involuntary movements or tremors.  Oriented to her name but not to her birthdate, current year, current location or situation. Skin: No significant bruising. Nodes: No cervical adenopathy Psych: Cooperative, pleasant.  Intake/Output from previous day: No intake/output data  recorded. Intake/Output this shift: No intake/output data recorded.  LAB RESULTS: Recent Labs    05/08/20 1506 05/09/20 0942  WBC 9.6 14.3*  HGB 14.1 14.9  HCT 45.7 48.4*  PLT 381 329   BMET Lab Results  Component Value Date   NA 141 05/09/2020   NA 139 05/08/2020   NA 137 02/08/2019   K 3.3 (L) 05/09/2020   K 3.6 05/08/2020   K 4.1 02/08/2019   CL 104 05/09/2020   CL 101 05/08/2020   CL 102 02/08/2019   CO2 25 05/09/2020   CO2 27 05/08/2020   CO2 25 02/08/2019   GLUCOSE 102 (H) 05/09/2020  GLUCOSE 128 (H) 05/08/2020   GLUCOSE 125 (H) 02/08/2019   BUN 16 05/09/2020   BUN 15 05/08/2020   BUN 21 02/08/2019   CREATININE 0.93 05/09/2020   CREATININE 0.82 05/08/2020   CREATININE 0.98 02/08/2019   CALCIUM 10.2 05/09/2020   CALCIUM 10.0 05/08/2020   CALCIUM 9.6 02/08/2019   LFT Recent Labs    05/08/20 1506 05/09/20 0942  PROT 7.6 8.5*  ALBUMIN 3.9 4.2  AST 21 23  ALT 11 12  ALKPHOS 75 80  BILITOT 1.5* 2.0*   PT/INR No results found for: INR, PROTIME Hepatitis Panel No results for input(s): HEPBSAG, HCVAB, HEPAIGM, HEPBIGM in the last 72 hours. C-Diff No components found for: CDIFF Lipase     Component Value Date/Time   LIPASE 27 05/09/2020 0942    Drugs of Abuse  No results found for: LABOPIA, COCAINSCRNUR, LABBENZ, AMPHETMU, THCU, LABBARB   RADIOLOGY STUDIES: CT ABDOMEN PELVIS WO CONTRAST  Result Date: 05/09/2020 CLINICAL DATA:  Abdominal pain.  Vomiting blood. EXAM: CT ABDOMEN AND PELVIS WITHOUT CONTRAST TECHNIQUE: Multidetector CT imaging of the abdomen and pelvis was performed following the standard protocol without IV contrast. COMPARISON:  05/08/2020 FINDINGS: Lower chest: Paraesophageal hiatal hernia as seen yesterday. Mild atelectasis at the left lung base. Hepatobiliary: Previous cholecystectomy. Liver parenchyma appears normal. Pancreas: Normal Spleen: Normal Adrenals/Urinary Tract: Adrenal glands are normal. Some residual excreted contrast.  No evidence of hydronephrosis or pyelonephritis. Bladder contains contrast and appears normal. Stomach/Bowel: Paraesophageal hernia as noted above. Stomach is full of fluid. Small bowel pattern is normal. Appendix is normal. Diverticulosis of the left colon without visible diverticulitis. Tiny paraumbilical ventral hernia containing only fat. Vascular/Lymphatic: Aortic atherosclerosis. No aneurysm. IVC is normal. No retroperitoneal adenopathy. Reproductive: Negative Other: No free fluid or air. Musculoskeletal: Old L4 compression fracture. Old T9 compression fracture. Curvature in chronic degenerative changes of the spine. IMPRESSION: 1. No change since yesterday. No acute finding by CT. Paraesophageal hiatal hernia as seen yesterday. 2. Diverticulosis of the left colon without visible diverticulitis. 3. Aortic atherosclerosis. 4. Old L4 and T9 compression fractures. Curvature and chronic degenerative changes of the spine. Aortic Atherosclerosis (ICD10-I70.0). Electronically Signed   By: Nelson Chimes M.D.   On: 05/09/2020 14:10   CT ABDOMEN PELVIS W CONTRAST  Result Date: 05/08/2020 CLINICAL DATA:  Vomiting with hematemesis for 2 days. Possible bowel obstruction. EXAM: CT ABDOMEN AND PELVIS WITH CONTRAST TECHNIQUE: Multidetector CT imaging of the abdomen and pelvis was performed using the standard protocol following bolus administration of intravenous contrast. CONTRAST:  166m OMNIPAQUE IOHEXOL 300 MG/ML  SOLN COMPARISON:  07/03/2018 FINDINGS: Lower Chest: No acute findings. Hepatobiliary: No hepatic masses identified. Prior cholecystectomy. No evidence of biliary obstruction. Pancreas:  No mass or inflammatory changes. Spleen: Within normal limits in size and appearance. Adrenals/Urinary Tract: No masses identified. No evidence of ureteral calculi or hydronephrosis. Stomach/Bowel: New moderate sized paraesophageal hiatal hernia is seen. No evidence of obstruction, inflammatory process or abnormal fluid  collections. Normal appendix visualized. Diverticulosis is seen mainly involving the sigmoid colon, however there is no evidence of diverticulitis. Stable tiny paraumbilical ventral hernia containing only fat. Vascular/Lymphatic: No pathologically enlarged lymph nodes. No acute vascular findings. Aortic atherosclerotic calcification noted. Reproductive:  No mass or other significant abnormality. Other:  None. Musculoskeletal: No suspicious bone lesions identified. Old L4 vertebral body compression fracture again noted. IMPRESSION: No evidence of bowel obstruction. New moderate paraesophageal hiatal hernia. Stable tiny paraumbilical ventral hernia, which contains only fat. Colonic diverticulosis, without radiographic evidence  of diverticulitis. Aortic Atherosclerosis (ICD10-I70.0). Electronically Signed   By: Marlaine Hind M.D.   On: 05/08/2020 18:40     IMPRESSION:   *   Hematemesis. Rule out Mallory-Weiss tear.  Rule out bleeding from ulcers associated with her paraesophageal hernia.    PLAN:     *    Continue Protonix drip. Serial hemoglobins, hematocrits.  *    orders entered into [facility] for EGD, will discuss timing with Dr. Scarlette Shorts.   Azucena Freed  05/09/2020, 4:02 PM Phone [phone]  GI ATTENDING  History, laboratories, x-rays reviewed.  Patient seen and examined.  Agree with comprehensive consultation note as outlined above.  Elderly female with hematemesis.  Hemodynamically stable.  Suspect related to her paraesophageal hernia.  May have intermittent obstruction.  Could have esophagitis.  Other etiology to be ruled out.  Set up for upper endoscopy tomorrow.  Please keep her on PPI therapy.  She is high risk given her age.  Docia Chuck. Geri Seminole., M.D. [facility] Division of Gastroenterology

## 2020-05-09 NOTE — Consult Note (Addendum)
[facility] Consult: 4:02 PM 05/09/2020  LOS: 0 days    Referring Provider: ED physician, Dr. Royce Macadamia Primary Care Physician:  Patient, No Pcp Per (Inactive) Primary Gastroenterologist:  Dr. Marlynn Perking    Reason for Consultation: Hematemesis   HPI: Natalie Spencer is a 87 y.o. female.  PMH dementia.  Gallbladder surgically absent.  CVA.  Anemia.  Epic records of any GI scopes. Resident at SNF Seen at ED yesterday for emesis that began the day prior.  1 episode the day before to that day, second episode yesterday was dark, CGE appearing and tested Gastroccult positive.   CTAP showed a new paraesophageal hiatal hernia, tiny periumbilical ventral hernia containing fat, colonic diverticulosis, aortic atherosclerosis..  Hgb 14.1, MCV 92.  lipase normal.  T bili minimally elevated at 1.5 but otherwise normal LFTs.  Urinalysis bland.  Troponins were minimally elevated but no ischemic changes on EKG..  No melena on exam, no repeats of the CGE.  Vital signs stable.  Provided with prescription for Protonix, Zofran and PCP follow-up recommended at discharge from the ED.  Prior to yesterday she had not been on any PPI or H2 blocker.  There is been no abdominal pain.  Previous appetite unknown.  No reports of melena or bloody stools.  She returned to the ED today recurrent emesis, now vomiting bright red blood. BP is actually hypertensive, heart rate in the low 100s to 1 teens.  Oxygen saturations on room air anywhere from 94 to 100%. Hgb 14.9.  WBCs, previously normal are 14.3.  BUN and creatinine remain normal.  T bili 2.0 and alk phos, transaminases still normal. CTAP with contrast repeated, no new abdominal pelvic, chest findings.  Unable to obtain family history or social history on this demented patient.    Past Medical  History:  Diagnosis Date  . Ambulatory dysfunction   . Hypertension   . Muscle weakness (generalized)   . Senile dementia ([place])   . Stroke [facility])     Past Surgical History:  Procedure Laterality Date  . CATARACT EXTRACTION, BILATERAL    . LAPAROSCOPIC CHOLECYSTECTOMY    . RUPTURED GLOBE EXPLORATION AND REPAIR Left 02/09/2018   Procedure: REPAIR OF RUPTURED GLOBE LEFT EYE;  Surgeon: Jalene Mullet, MD;  Location: [facility];  Service: Ophthalmology;  Laterality: Left;    Prior to Admission medications   Medication Sig Start Date End Date Taking? Authorizing Provider  acetaminophen (TYLENOL) 325 MG tablet Take 2 tablets (650 mg total) by mouth every 4 (four) hours as needed for mild pain. 02/15/18   Saverio Danker, PA-C  erythromycin ophthalmic ointment Place into the left eye every 8 (eight) hours. 02/15/18   Saverio Danker, PA-C  ondansetron (ZOFRAN ODT) 4 MG disintegrating tablet Take 1 tablet (4 mg total) by mouth every 8 (eight) hours as needed for nausea or vomiting. 05/08/20   Gareth Morgan, MD  pantoprazole (PROTONIX) 20 MG tablet Take 2 tablets (40 mg total) by mouth daily for 14 days. 05/08/20 05/22/20  Gareth Morgan, MD  traMADol (ULTRAM) 50 MG tablet Take 1 tablet (  50 mg total) by mouth every 6 (six) hours as needed for moderate pain. 02/15/18   Saverio Danker, PA-C    Scheduled Meds:  Infusions: . pantoprozole (PROTONIX) infusion     PRN Meds: acetaminophen **OR** acetaminophen   Allergies as of 05/09/2020  . (No Known Allergies)    History reviewed. No pertinent family history.  Social History   Socioeconomic History  . Marital status: Widowed    Spouse name: Not on file  . Number of children: Not on file  . Years of education: Not on file  . Highest education level: Not on file  Occupational History  . Not on file  Tobacco Use  . Smoking status: Never Smoker  . Smokeless tobacco: Never Used  Substance and Sexual Activity  . Alcohol use: Not Currently  .  Drug use: Never  . Sexual activity: Not on file  Other Topics Concern  . Not on file  Social History Narrative  . Not on file   Social Determinants of Health   Financial Resource Strain: Not on file  Food Insecurity: Not on file  Transportation Needs: Not on file  Physical Activity: Not on file  Stress: Not on file  Social Connections: Not on file  Intimate Partner Violence: Not on file    REVIEW OF SYSTEMS: Unable to perform ROS due to patient's dementia.   PHYSICAL EXAM: Vital signs in last 24 hours: Vitals:   05/09/20 1305 05/09/20 1430  BP: (!) 139/113 (!) 161/100  Pulse: (!) 114 (!) 110  Resp: (!) 22 (!) 22  Temp:    SpO2: 94% 96%   Wt Readings from Last 3 Encounters:  05/08/20 54.4 kg    General: Patient is aged, moderately frail appearing, alert and conversant but her speech does not correspond with the discussion at hand or follow logic. Head: No facial asymmetry or swelling.  No signs of head trauma. Eyes: Scleral icterus.  No conjunctival pallor.   Ears: Not hard of hearing Nose: No congestion or discharge Mouth: Many absent teeth.  Mucosa is moist, pink, clear.  Tongue midline.  No old or fresh blood Neck: No JVD, masses, thyromegaly Lungs: Clear bilaterally though breath sounds reduced.  No labored breathing or cough Heart: RRR.  No MRG.  S1, S2 present Abdomen:   Soft without tenderness.  Bowel sounds hypoactive.  No HSM, masses, bruits, hernias..   Rectal: Deferred. Musc/Skeltl: Kyphosis of the spine.  No erythema or swelling in joints in the limbs. Extremities: No CCE. Neurologic: Pleasant, fluid speech.  Moves all 4 limbs but unable to cooperate for strength testing.  No involuntary movements or tremors.  Oriented to her name but not to her birthdate, current year, current location or situation. Skin: No significant bruising. Nodes: No cervical adenopathy Psych: Cooperative, pleasant.  Intake/Output from previous day: No intake/output data  recorded. Intake/Output this shift: No intake/output data recorded.  LAB RESULTS: Recent Labs    05/08/20 1506 05/09/20 0942  WBC 9.6 14.3*  HGB 14.1 14.9  HCT 45.7 48.4*  PLT 381 329   BMET Lab Results  Component Value Date   NA 141 05/09/2020   NA 139 05/08/2020   NA 137 02/08/2019   K 3.3 (L) 05/09/2020   K 3.6 05/08/2020   K 4.1 02/08/2019   CL 104 05/09/2020   CL 101 05/08/2020   CL 102 02/08/2019   CO2 25 05/09/2020   CO2 27 05/08/2020   CO2 25 02/08/2019   GLUCOSE 102 (H) 05/09/2020  GLUCOSE 128 (H) 05/08/2020   GLUCOSE 125 (H) 02/08/2019   BUN 16 05/09/2020   BUN 15 05/08/2020   BUN 21 02/08/2019   CREATININE 0.93 05/09/2020   CREATININE 0.82 05/08/2020   CREATININE 0.98 02/08/2019   CALCIUM 10.2 05/09/2020   CALCIUM 10.0 05/08/2020   CALCIUM 9.6 02/08/2019   LFT Recent Labs    05/08/20 1506 05/09/20 0942  PROT 7.6 8.5*  ALBUMIN 3.9 4.2  AST 21 23  ALT 11 12  ALKPHOS 75 80  BILITOT 1.5* 2.0*   PT/INR No results found for: INR, PROTIME Hepatitis Panel No results for input(s): HEPBSAG, HCVAB, HEPAIGM, HEPBIGM in the last 72 hours. C-Diff No components found for: CDIFF Lipase     Component Value Date/Time   LIPASE 27 05/09/2020 0942    Drugs of Abuse  No results found for: LABOPIA, COCAINSCRNUR, LABBENZ, AMPHETMU, THCU, LABBARB   RADIOLOGY STUDIES: CT ABDOMEN PELVIS WO CONTRAST  Result Date: 05/09/2020 CLINICAL DATA:  Abdominal pain.  Vomiting blood. EXAM: CT ABDOMEN AND PELVIS WITHOUT CONTRAST TECHNIQUE: Multidetector CT imaging of the abdomen and pelvis was performed following the standard protocol without IV contrast. COMPARISON:  05/08/2020 FINDINGS: Lower chest: Paraesophageal hiatal hernia as seen yesterday. Mild atelectasis at the left lung base. Hepatobiliary: Previous cholecystectomy. Liver parenchyma appears normal. Pancreas: Normal Spleen: Normal Adrenals/Urinary Tract: Adrenal glands are normal. Some residual excreted contrast.  No evidence of hydronephrosis or pyelonephritis. Bladder contains contrast and appears normal. Stomach/Bowel: Paraesophageal hernia as noted above. Stomach is full of fluid. Small bowel pattern is normal. Appendix is normal. Diverticulosis of the left colon without visible diverticulitis. Tiny paraumbilical ventral hernia containing only fat. Vascular/Lymphatic: Aortic atherosclerosis. No aneurysm. IVC is normal. No retroperitoneal adenopathy. Reproductive: Negative Other: No free fluid or air. Musculoskeletal: Old L4 compression fracture. Old T9 compression fracture. Curvature in chronic degenerative changes of the spine. IMPRESSION: 1. No change since yesterday. No acute finding by CT. Paraesophageal hiatal hernia as seen yesterday. 2. Diverticulosis of the left colon without visible diverticulitis. 3. Aortic atherosclerosis. 4. Old L4 and T9 compression fractures. Curvature and chronic degenerative changes of the spine. Aortic Atherosclerosis (ICD10-I70.0). Electronically Signed   By: Nelson Chimes M.D.   On: 05/09/2020 14:10   CT ABDOMEN PELVIS W CONTRAST  Result Date: 05/08/2020 CLINICAL DATA:  Vomiting with hematemesis for 2 days. Possible bowel obstruction. EXAM: CT ABDOMEN AND PELVIS WITH CONTRAST TECHNIQUE: Multidetector CT imaging of the abdomen and pelvis was performed using the standard protocol following bolus administration of intravenous contrast. CONTRAST:  166m OMNIPAQUE IOHEXOL 300 MG/ML  SOLN COMPARISON:  07/03/2018 FINDINGS: Lower Chest: No acute findings. Hepatobiliary: No hepatic masses identified. Prior cholecystectomy. No evidence of biliary obstruction. Pancreas:  No mass or inflammatory changes. Spleen: Within normal limits in size and appearance. Adrenals/Urinary Tract: No masses identified. No evidence of ureteral calculi or hydronephrosis. Stomach/Bowel: New moderate sized paraesophageal hiatal hernia is seen. No evidence of obstruction, inflammatory process or abnormal fluid  collections. Normal appendix visualized. Diverticulosis is seen mainly involving the sigmoid colon, however there is no evidence of diverticulitis. Stable tiny paraumbilical ventral hernia containing only fat. Vascular/Lymphatic: No pathologically enlarged lymph nodes. No acute vascular findings. Aortic atherosclerotic calcification noted. Reproductive:  No mass or other significant abnormality. Other:  None. Musculoskeletal: No suspicious bone lesions identified. Old L4 vertebral body compression fracture again noted. IMPRESSION: No evidence of bowel obstruction. New moderate paraesophageal hiatal hernia. Stable tiny paraumbilical ventral hernia, which contains only fat. Colonic diverticulosis, without radiographic evidence  of diverticulitis. Aortic Atherosclerosis (ICD10-I70.0). Electronically Signed   By: Constantin Hillery A Stahl M.D.   On: 05/08/2020 18:40     IMPRESSION:   *   Hematemesis. Rule out Mallory-Weiss tear.  Rule out bleeding from ulcers associated with her paraesophageal hernia.    PLAN:     *    Continue Protonix drip. Serial hemoglobins, hematocrits.  *    orders entered into [facility] for EGD, will discuss timing with Dr. Kaoru Rezendes.   Sarah Gribbin  05/09/2020, 4:02 PM Phone [phone]  GI ATTENDING  History, laboratories, x-rays reviewed.  Patient seen and examined.  Agree with comprehensive consultation note as outlined above.  Elderly female with hematemesis.  Hemodynamically stable.  Suspect related to her paraesophageal hernia.  May have intermittent obstruction.  Could have esophagitis.  Other etiology to be ruled out.  Set up for upper endoscopy tomorrow.  Please keep her on PPI therapy.  She is high risk given her age.  Marvelle Span N. Alhaji Mcneal, Jr., M.D. [facility]   

## 2020-05-09 NOTE — Progress Notes (Signed)
Brief note regarding plan, with full H&P to follow:  87 year old female with history of dementia, who is admitted for acute upper GI bleed after presenting from SNF for evaluation of hematemesis. Hgb stable.  Every 4 hour H&H's have been ordered through 9 AM tomorrow.  Started on Protonix drip.  Type and screened. On-call GI consulted (Dr. Marina Goodell of [facility]), with recs pending. PICC lined being placed due to poor peripheral access. INR pending.    Newton Pigg, DO Hospitalist

## 2020-05-09 NOTE — H&P (Signed)
History and Physical    PLEASE NOTE THAT DRAGON DICTATION SOFTWARE WAS USED IN THE CONSTRUCTION OF THIS NOTE.   Natalie Spencer BJY:[id number] DOB: 12/22/1926 DOA: 05/09/2020  PCP: Patient, No Pcp Per (Inactive) Patient coming from: SNF  I have personally briefly reviewed patient's [facility] medical [facility]  Chief Complaint: hematemesis   HPI: Natalie Spencer is a 87 y.o. female with medical history significant for advanced dementia, essential hypertension who is [facility] on 05/09/2020 with acute upper GI bleed after presenting from SNF to [facility] ED complaining of hematemesis.   In the setting of patient's advanced dementia, the following history is provided by SNF staff, my discussions with the emergency department physician, and via chart review.  The patient was brought to [facility] emergency department yesterday for evaluation of nausea/vomiting associated with 1 episode of hematemesis.  There was concern at the time that she may be experiencing associated abdominal discomfort, prompting CT abdomen/pelvis, which showed no evidence of acute intra-abdominal process.  Her labs and vital signs during that ED course were found to be stable and without significant acute abnormality.  Her nausea was subsequently improved via as needed antiemetics.  She received IV fluids, it was subsequently discharged back to SNF from ED on as needed Zofran and Protonix.  However, in the interval, the patient reportedly is developed additional nausea/vomiting, resulting 2-3 episodes of vomiting, including an additional episode hematemesis at SNF, associated reported appearance of bright red blood without associated coffee-ground appearance. this occurred in spite of use of as needed Zofran, and subsequently the patient was brought back [facility] department for further evaluation and management interval nausea/vomiting as well as episode hematemesis.  No reported preceding trauma.  The  patient does not appear to have any history of alcohol consumption or underlying history of chronic liver disease.  Not on any blood thinners as an outpatient, including no aspirin.  No documented history of prior gastrointestinal bleed.   Per my discussions with the patient today, she is without acute complaint.     ED Course:  Vital signs in the ED were notable for the following: Temperature max 98.6, heart rate 107- 115; systolic blood pressure in the 130s to 160s mmHg, respiratory rate 16-21, oxygen saturation 9620% on room air.  Labs were notable for the following: CMP was notable for the following: Potassium 3.3, BUN 16 compared to 15 yesterday, creatinine 0.93 relative to 0.82 yesterday.  CBC notable for the following: White blood cell count of 14,000 with 76% neutrophils, hemoglobin 14.9 relative to 14.1 yesterday and in comparison to most recent prior value 13.2 in January 2021, platelets 329.  Lactic acid 2.0.  Urinalysis showed no white blood cells, nitrate negative, leukocyte Estrace negative, and was notable for specific gravity 1.046.  Nasopharyngeal COVID-19 PCR was performed in the ED today, with result currently pending.  EKG showed sinus tachycardia with heart rate 115, PAC, nonspecific intra ventricular conduction delay, nonspecific T wave inversion in V1, non-specific < 1 mm ST elevation limited to lead III, nonspecific < 1 mm ST depression in aVL.  CT abdomen/pelvis was repeated today, showing no evidence of acute intra-abdominal process, no interval change relative to CT abdomen/pelvis performed yesterday.  While in the ED, there were two additional episodes of hematemesis associated with small volume of bright red blood reportedly observed by ED staff.  ED physician consulted on-call gastroenterologist, Dr. Marina GoodellPerry of [facility], who has agreed to formally consult and will evaluate  the patient.  Of note, there was some difficulty in establishing peripheral access in the ED in  spite of mutliple attempts to do so, prompting consultation of IR for PICC line placement.   While in the ED, the following were administered: Zofran 4 mg IV x1, famotidine 20 mg IV x1, normal saline x1 L bolus.      Review of Systems: As per HPI otherwise 10 point review of systems negative.   Past Medical History:  Diagnosis Date  . Ambulatory dysfunction   . Hypertension   . Muscle weakness (generalized)   . Senile dementia (HCC)   . Stroke [facility])     Past Surgical History:  Procedure Laterality Date  . CATARACT EXTRACTION, BILATERAL    . LAPAROSCOPIC CHOLECYSTECTOMY    . RUPTURED GLOBE EXPLORATION AND REPAIR Left 02/09/2018   Procedure: REPAIR OF RUPTURED GLOBE LEFT EYE;  Surgeon: Carmela Rima, MD;  Location: [facility] OR;  Service: Ophthalmology;  Laterality: Left;    Social History:  reports that she has never smoked. She has never used smokeless tobacco. She reports previous alcohol use. She reports that she does not use drugs.   No Known Allergies  History reviewed. No pertinent family history.   Prior to Admission medications   Medication Sig Start Date End Date Taking? Authorizing Provider  acetaminophen (TYLENOL) 325 MG tablet Take 2 tablets (650 mg total) by mouth every 4 (four) hours as needed for mild pain. 02/15/18   Barnetta Chapel, PA-C  erythromycin ophthalmic ointment Place into the left eye every 8 (eight) hours. 02/15/18   Barnetta Chapel, PA-C  ondansetron (ZOFRAN ODT) 4 MG disintegrating tablet Take 1 tablet (4 mg total) by mouth every 8 (eight) hours as needed for nausea or vomiting. 05/08/20   Alvira Monday, MD  pantoprazole (PROTONIX) 20 MG tablet Take 2 tablets (40 mg total) by mouth daily for 14 days. 05/08/20 05/22/20  Alvira Monday, MD  traMADol (ULTRAM) 50 MG tablet Take 1 tablet (50 mg total) by mouth every 6 (six) hours as needed for moderate pain. 02/15/18   Barnetta Chapel, PA-C     Objective    Physical Exam: Vitals:   05/09/20 1130 05/09/20  1200 05/09/20 1305 05/09/20 1430  BP: (!) 162/129 (!) 183/112 (!) 139/113 (!) 161/100  Pulse: (!) 109 (!) 109 (!) 114 (!) 110  Resp: 16 20 (!) 22 (!) 22  Temp:      TempSrc:      SpO2: 100% 97% 94% 96%    General: appears to be stated age; alert, but not oriented to person, place, time Skin: warm, dry, no rash Head:  AT/[place] Mouth:  Oral mucosa membranes appear dry, normal dentition Neck: supple; trachea midline Heart: Tachycardic, regular; did not appreciate any M/R/G Lungs: CTAB, did not appreciate any wheezes, rales, or rhonchi Abdomen: + BS; soft, ND, NT Vascular: 2+ pedal pulses b/l; 2+ radial pulses b/l Extremities: no peripheral edema, no muscle wasting Neuro:  In the setting of the patient's addvance dementia, and associated limited inability to follow instructions, unable to perform full neurologic exam at this time.  As such, assessment of strength, sensation, and cranial nerves is limited at this time. Patient noted to spontaneously move all 4 extremities. No tremors.     Labs on Admission: I have personally reviewed following labs and imaging studies  CBC: Recent Labs  Lab 05/08/20 1506 05/09/20 0942  WBC 9.6 14.3*  NEUTROABS 7.6 10.9*  HGB 14.1 14.9  HCT 45.7 48.4*  MCV  92.1 92.7  PLT 381 329   Basic Metabolic Panel: Recent Labs  Lab 05/08/20 1506 05/09/20 0942  NA 139 141  K 3.6 3.3*  CL 101 104  CO2 27 25  GLUCOSE 128* 102*  BUN 15 16  CREATININE 0.82 0.93  CALCIUM 10.0 10.2   GFR: Estimated Creatinine Clearance: 27.7 mL/min (by C-G formula based on SCr of 0.93 mg/dL). Liver Function Tests: Recent Labs  Lab 05/08/20 1506 05/09/20 0942  AST 21 23  ALT 11 12  ALKPHOS 75 80  BILITOT 1.5* 2.0*  PROT 7.6 8.5*  ALBUMIN 3.9 4.2   Recent Labs  Lab 05/08/20 1506 05/09/20 0942  LIPASE 26 27   No results for input(s): AMMONIA in the last 168 hours. Coagulation Profile: No results for input(s): INR, PROTIME in the last 168 hours. Cardiac  Enzymes: No results for input(s): CKTOTAL, CKMB, CKMBINDEX, TROPONINI in the last 168 hours. BNP (last 3 results) No results for input(s): PROBNP in the last 8760 hours. HbA1C: No results for input(s): HGBA1C in the last 72 hours. CBG: No results for input(s): GLUCAP in the last 168 hours. Lipid Profile: No results for input(s): CHOL, HDL, LDLCALC, TRIG, CHOLHDL, LDLDIRECT in the last 72 hours. Thyroid Function Tests: No results for input(s): TSH, T4TOTAL, FREET4, T3FREE, THYROIDAB in the last 72 hours. Anemia Panel: No results for input(s): VITAMINB12, FOLATE, FERRITIN, TIBC, IRON, RETICCTPCT in the last 72 hours. Urine analysis:    Component Value Date/Time   COLORURINE YELLOW 05/08/2020 1955   APPEARANCEUR CLEAR 05/08/2020 1955   LABSPEC >1.046 (H) 05/08/2020 1955   PHURINE 7.0 05/08/2020 1955   GLUCOSEU NEGATIVE 05/08/2020 1955   HGBUR NEGATIVE 05/08/2020 1955   BILIRUBINUR NEGATIVE 05/08/2020 1955   KETONESUR 5 (A) 05/08/2020 1955   PROTEINUR NEGATIVE 05/08/2020 1955   NITRITE NEGATIVE 05/08/2020 1955   LEUKOCYTESUR NEGATIVE 05/08/2020 1955    Radiological Exams on Admission: CT ABDOMEN PELVIS WO CONTRAST  Result Date: 05/09/2020 CLINICAL DATA:  Abdominal pain.  Vomiting blood. EXAM: CT ABDOMEN AND PELVIS WITHOUT CONTRAST TECHNIQUE: Multidetector CT imaging of the abdomen and pelvis was performed following the standard protocol without IV contrast. COMPARISON:  05/08/2020 FINDINGS: Lower chest: Paraesophageal hiatal hernia as seen yesterday. Mild atelectasis at the left lung base. Hepatobiliary: Previous cholecystectomy. Liver parenchyma appears normal. Pancreas: Normal Spleen: Normal Adrenals/Urinary Tract: Adrenal glands are normal. Some residual excreted contrast. No evidence of hydronephrosis or pyelonephritis. Bladder contains contrast and appears normal. Stomach/Bowel: Paraesophageal hernia as noted above. Stomach is full of fluid. Small bowel pattern is normal. Appendix  is normal. Diverticulosis of the left colon without visible diverticulitis. Tiny paraumbilical ventral hernia containing only fat. Vascular/Lymphatic: Aortic atherosclerosis. No aneurysm. IVC is normal. No retroperitoneal adenopathy. Reproductive: Negative Other: No free fluid or air. Musculoskeletal: Old L4 compression fracture. Old T9 compression fracture. Curvature in chronic degenerative changes of the spine. IMPRESSION: 1. No change since yesterday. No acute finding by CT. Paraesophageal hiatal hernia as seen yesterday. 2. Diverticulosis of the left colon without visible diverticulitis. 3. Aortic atherosclerosis. 4. Old L4 and T9 compression fractures. Curvature and chronic degenerative changes of the spine. Aortic Atherosclerosis (ICD10-I70.0). Electronically Signed   By: Paulina Fusi M.D.   On: 05/09/2020 14:10   CT ABDOMEN PELVIS W CONTRAST  Result Date: 05/08/2020 CLINICAL DATA:  Vomiting with hematemesis for 2 days. Possible bowel obstruction. EXAM: CT ABDOMEN AND PELVIS WITH CONTRAST TECHNIQUE: Multidetector CT imaging of the abdomen and pelvis was performed using the standard protocol following  bolus administration of intravenous contrast. CONTRAST:  OMNIPAQUE IOHEXOL 300 MG/ML  SOLN COMPARISON:  07/03/2018 FINDINGS: Lower Chest: No acute findings. Hepatobiliary: No hepatic masses identified. Prior cholecystectomy. No evidence of biliary obstruction. Pancreas:  No mass or inflammatory changes. Spleen: Within normal limits in size and appearance. Adrenals/Urinary Tract: No masses identified. No evidence of ureteral calculi or hydronephrosis. Stomach/Bowel: New moderate sized paraesophageal hiatal hernia is seen. No evidence of obstruction, inflammatory process or abnormal fluid collections. Normal appendix visualized. Diverticulosis is seen mainly involving the sigmoid colon, however there is no evidence of diverticulitis. Stable tiny paraumbilical ventral hernia containing only fat.  Vascular/Lymphatic: No pathologically enlarged lymph nodes. No acute vascular findings. Aortic atherosclerotic calcification noted. Reproductive:  No mass or other significant abnormality. Other:  None. Musculoskeletal: No suspicious bone lesions identified. Old L4 vertebral body compression fracture again noted. IMPRESSION: No evidence of bowel obstruction. New moderate paraesophageal hiatal hernia. Stable tiny paraumbilical ventral hernia, which contains only fat. Colonic diverticulosis, without radiographic evidence of diverticulitis. Aortic Atherosclerosis (ICD10-I70.0). Electronically Signed   By: Danae Orleans M.D.   On: 05/08/2020 18:40     EKG: Independently reviewed, with result as described above.    Assessment/Plan    Natalie Spencer is a 87 y.o. female with medical history significant for advanced dementia, essential hypertension who is admitted to [facility] on 05/09/2020 with acute upper GI bleed after presenting from SNF to [facility] ED complaining of hematemesis.    Principal Problem:   Acute upper GI bleed Active Problems:   Nausea & vomiting   Lactic acidosis   Hypertension   Leukocytosis   Hypokalemia       #) Acute Upper GI Bleed: diagnosis on the basis of 3-4 reported episodes of hematemesis over the last 2 days. Not on any blood thinners as an outpatient, including no aspirin. Not overt NSAID use. No known history of known underlying liver disease, and does not appear to have any history of alcohol abuse or recent alcohol consumption.  Differential includes esophagitis, gastritis, peptic ulcer disease,  Dieulafoy lesion, Cameron ulcer. In the absence of known liver disease, initiation of SBP prophylaxis does not appear to be warranted. Presentation and history are less suggestive of variceal bleed, and therefore there does not appear to be an indication for octreotide.    At this time, the patient appears milldly tachycardic in the setting of appearing significantly  dehydrated, dry oral mucous membranes and elevated specific gravity, all in the context of multiple days of nausea/vomiting and minimal oral intake over that time.  Not associate with any hypotension thus far.  Of note, presentation does not overtly appear to be associated with any acute blood loss anemia, as present hemoglobin noted to be 14.9 relative to yesterday's value of 14.1.  This is a relative to most recent prior hemoglobin of 13.2 in January 2021, with presenting labs potentially reflecting a degree of hemoconcentration given clinical evidence of dehydration.  Patient reports that she is asymptomatic, although evaluation of such significant limited by advanced dementia. Of note, patient was typed and screened in the ED today. Given suspected upper GI source, will initiate Protonix drip. Case was discussed with the on-call gastroenterologist, Dr Marina Goodell who will formally consult and evaluate the patient, as further described above.  Does not appear to require PBC transfusion at this time.  Rather, we will closely monitor ensuing hemoglobin trend via every 4 hour H&H checks, while administering gentle IV fluids and closely monitoring trend and vital  signs to assist with ensuing clinical decision-making regarding need for transfusion.  In terms of antiemetic choice, will pursue.  IV Ativan given that the patient is on this medication an outpatient  And appears to tolerate it well without any associated paradoxical agitation.        Plan: NPO. Refraining from pharmacologic DVT prophylaxis. Monitor on telemetry. Monitor continuous pulse-ox. Maintain at least 2 large bore IV's.  Add on INR, and recheck INR in the AM. Q4H H&H's have been ordered through 9 AM tomorrow (4/28). Will closely monitor these ensuing Hgb levels and correlate these data points with the patient's overall clinical picture including vital signs to determine need for subsequent transfusion.  Protonix drip, as above.  Repeat CMP tomorrow  AM. Will also add-on iron studies.  Gastroenterology formally consulted, as further described above, with additional recs currently pending.  Lactated Ringer's at 75 cc/h x 2 hours.  As needed IV Ativan, as above.       #) Lactic acidosis: Presenting lactate mildly elevated 2.0, in the absence of overt metabolic acidosis per review of CMP.  Suspect that this is on the basis of relative decline in oxygen carrying/delivery capacity in the context of presenting acute upper gastrointestinal bleeding, as above.  Does not meet criteria for sepsis, no evidence of underlying infectious process at this time. Will check INR to evaluate hepatic synthetic function, as below.    Plan: Management of presenting acute upper gastrointestinal bleed, as above.  Continuous IV fluids, as above, with repeat lactate to occur now.  Repeat CMP in the morning as well as CBC at that time.  Check INR.       #) Hypokalemia: Presenting serum potassium mildly low at 3.3.  This is in the setting of 1 to 2 days of intermittent nausea/vomiting.   Plan: Initiate continuous lactated Ringer's, as further described above.  Repeat CMP in the morning.  Add on serum magnesium level.     #) Hypertension: Documented history of such, on Norvasc as an outpatient.  Presenting systolic blood pressures in the 130's - 160's mmHg, without any associated evidence of hypotension.  In the setting of presenting acute upper gastrointestinal bleed and associated current n.p.o. status, will hold home Norvasc.    Plan: Hold home Norvasc for now.  Close monitoring of ensuing blood pressure via routine vital signs.  Given increased risk for worsening bleeding with uncontrolled hypertension, I have ordered as needed IV Hydralazine for systolic blood pressure greater than 180 mmHg or diastolic BP > 120 mmHg.      #) Leukocytosis: Mildly elevated with cell count at presentation, which I suspect stems from a strong contribution from  hemoconcentration in the setting of duration as result of Warnicke's nausea/vomiting and limited oral intake over that time, and appearing further substantiated by elevated SG on presenting UA, as further detailed above.  No evidence of underlying infectious process, including urinalysis which is not consistent with urinary tract infection.  Of note, result of screening nasopharyngeal COVID-19 PCR checked in the ED today, is currently pending.  CT abdomen/pelvis shows no evidence of acute intra-abdominal process.  Plan: IV fluids, as above.  Repeat CBC with differential morning.  Follow for result of screening nasopharyngeal COVID-19 PCR.      DVT prophylaxis: SCDs Code Status: Full code Family Communication: none Disposition Plan: Per Rounding Team Consults called: case discussed with on-call GI, Dr.Perry, who will formally consult and evaluate patient, as further described above Admission status: Observation; PCU  Of note, this patient was added by me to the following Admit List/Treatment Team: [email or web address].      PLEASE NOTE THAT DRAGON DICTATION SOFTWARE WAS USED IN THE CONSTRUCTION OF THIS NOTE.   Angie Fava DO [facility] Pager [phone] From 12PM - 8PM  Otherwise, please contact night-coverage  [email or web address] Password [facility]   05/09/2020, 3:36 PM

## 2020-05-09 NOTE — ED Triage Notes (Signed)
Pt back to the ED for vomiting blood , pt was seen yesterday for same , staff states that her vomit was bright red today

## 2020-05-09 NOTE — Progress Notes (Addendum)
This patient has received 80 ml's of IV omni300 contrast extravasation into her right upper arm during a CT abdomen and pelvis with IV contrast.  The exam was performed on Wednesday 05/09/20.  Site / affected area assessed by Brayton El (Radiology PA)

## 2020-05-09 NOTE — ED Notes (Signed)
Pt continues to remove cardiac monitor cables, pule ox and BP cuff.

## 2020-05-09 NOTE — ED Notes (Signed)
Patient transported to CT 

## 2020-05-10 ENCOUNTER — Inpatient Hospital Stay (HOSPITAL_COMMUNITY): Payer: Medicare Other | Admitting: Anesthesiology

## 2020-05-10 ENCOUNTER — Encounter (HOSPITAL_COMMUNITY): Payer: Self-pay | Admitting: Internal Medicine

## 2020-05-10 ENCOUNTER — Other Ambulatory Visit: Payer: Self-pay

## 2020-05-10 ENCOUNTER — Encounter (HOSPITAL_COMMUNITY)
Admission: EM | Disposition: A | Discharge status: Nursing Home (Includes ICF) | Payer: Self-pay | Source: Home / Self Care | Attending: Internal Medicine

## 2020-05-10 DIAGNOSIS — K922 Gastrointestinal hemorrhage, unspecified: Secondary | ICD-10-CM | POA: Diagnosis not present

## 2020-05-10 DIAGNOSIS — Z66 Do not resuscitate: Secondary | ICD-10-CM | POA: Diagnosis present

## 2020-05-10 DIAGNOSIS — Z20822 Contact with and (suspected) exposure to covid-19: Secondary | ICD-10-CM | POA: Diagnosis present

## 2020-05-10 DIAGNOSIS — D72829 Elevated white blood cell count, unspecified: Secondary | ICD-10-CM | POA: Diagnosis present

## 2020-05-10 DIAGNOSIS — M6281 Muscle weakness (generalized): Secondary | ICD-10-CM | POA: Diagnosis present

## 2020-05-10 DIAGNOSIS — K449 Diaphragmatic hernia without obstruction or gangrene: Secondary | ICD-10-CM | POA: Diagnosis present

## 2020-05-10 DIAGNOSIS — E876 Hypokalemia: Secondary | ICD-10-CM | POA: Diagnosis present

## 2020-05-10 DIAGNOSIS — Z6823 Body mass index (BMI) 23.0-23.9, adult: Secondary | ICD-10-CM | POA: Diagnosis not present

## 2020-05-10 DIAGNOSIS — K5730 Diverticulosis of large intestine without perforation or abscess without bleeding: Secondary | ICD-10-CM | POA: Diagnosis present

## 2020-05-10 DIAGNOSIS — F0390 Unspecified dementia without behavioral disturbance: Secondary | ICD-10-CM | POA: Diagnosis present

## 2020-05-10 DIAGNOSIS — K2100 Gastro-esophageal reflux disease with esophagitis, without bleeding: Secondary | ICD-10-CM

## 2020-05-10 DIAGNOSIS — R000 Tachycardia, unspecified: Secondary | ICD-10-CM | POA: Diagnosis present

## 2020-05-10 DIAGNOSIS — K920 Hematemesis: Secondary | ICD-10-CM | POA: Diagnosis present

## 2020-05-10 DIAGNOSIS — E872 Acidosis: Secondary | ICD-10-CM | POA: Diagnosis present

## 2020-05-10 DIAGNOSIS — R54 Age-related physical debility: Secondary | ICD-10-CM | POA: Diagnosis present

## 2020-05-10 DIAGNOSIS — E860 Dehydration: Secondary | ICD-10-CM | POA: Diagnosis present

## 2020-05-10 DIAGNOSIS — I10 Essential (primary) hypertension: Secondary | ICD-10-CM | POA: Diagnosis present

## 2020-05-10 DIAGNOSIS — Z8673 Personal history of transient ischemic attack (TIA), and cerebral infarction without residual deficits: Secondary | ICD-10-CM | POA: Diagnosis not present

## 2020-05-10 DIAGNOSIS — D649 Anemia, unspecified: Secondary | ICD-10-CM | POA: Diagnosis present

## 2020-05-10 DIAGNOSIS — K2091 Esophagitis, unspecified with bleeding: Secondary | ICD-10-CM | POA: Diagnosis present

## 2020-05-10 HISTORY — PX: ESOPHAGOGASTRODUODENOSCOPY (EGD) WITH PROPOFOL: SHX5813

## 2020-05-10 LAB — CBC WITH DIFFERENTIAL/PLATELET
Abs Immature Granulocytes: 0.04 10*3/uL (ref 0.00–0.07)
Basophils Absolute: 0.1 10*3/uL (ref 0.0–0.1)
Basophils Relative: 1 %
Eosinophils Absolute: 0 10*3/uL (ref 0.0–0.5)
Eosinophils Relative: 0 %
HCT: 42.1 % (ref 36.0–46.0)
Hemoglobin: 13.5 g/dL (ref 12.0–15.0)
Immature Granulocytes: 0 %
Lymphocytes Relative: 15 %
Lymphs Abs: 1.7 10*3/uL (ref 0.7–4.0)
MCH: 28.6 pg (ref 26.0–34.0)
MCHC: 32.1 g/dL (ref 30.0–36.0)
MCV: 89.2 fL (ref 80.0–100.0)
Monocytes Absolute: 1 10*3/uL (ref 0.1–1.0)
Monocytes Relative: 9 %
Neutro Abs: 8.4 10*3/uL — ABNORMAL HIGH (ref 1.7–7.7)
Neutrophils Relative %: 75 %
Platelets: 369 10*3/uL (ref 150–400)
RBC: 4.72 MIL/uL (ref 3.87–5.11)
RDW: 13.6 % (ref 11.5–15.5)
WBC: 11.2 10*3/uL — ABNORMAL HIGH (ref 4.0–10.5)
nRBC: 0 % (ref 0.0–0.2)

## 2020-05-10 LAB — COMPREHENSIVE METABOLIC PANEL
ALT: 12 U/L (ref 0–44)
AST: 21 U/L (ref 15–41)
Albumin: 3.6 g/dL (ref 3.5–5.0)
Alkaline Phosphatase: 70 U/L (ref 38–126)
Anion gap: 11 (ref 5–15)
BUN: 19 mg/dL (ref 8–23)
CO2: 28 mmol/L (ref 22–32)
Calcium: 9.7 mg/dL (ref 8.9–10.3)
Chloride: 103 mmol/L (ref 98–111)
Creatinine, Ser: 0.92 mg/dL (ref 0.44–1.00)
GFR, Estimated: 58 mL/min — ABNORMAL LOW (ref >60)
Glucose, Bld: 115 mg/dL — ABNORMAL HIGH (ref 70–99)
Potassium: 3.2 mmol/L — ABNORMAL LOW (ref 3.5–5.1)
Sodium: 142 mmol/L (ref 135–145)
Total Bilirubin: 1.6 mg/dL — ABNORMAL HIGH (ref 0.3–1.2)
Total Protein: 7 g/dL (ref 6.5–8.1)

## 2020-05-10 LAB — HEMOGLOBIN AND HEMATOCRIT, BLOOD
HCT: 40.9 % (ref 36.0–46.0)
Hemoglobin: 13 g/dL (ref 12.0–15.0)

## 2020-05-10 LAB — PROTIME-INR
INR: 1.1 (ref 0.8–1.2)
Prothrombin Time: 13.8 seconds (ref 11.4–15.2)

## 2020-05-10 LAB — MAGNESIUM: Magnesium: 2.1 mg/dL (ref 1.7–2.4)

## 2020-05-10 LAB — URINE CULTURE

## 2020-05-10 SURGERY — ESOPHAGOGASTRODUODENOSCOPY (EGD) WITH PROPOFOL
Anesthesia: General

## 2020-05-10 MED ORDER — LACTATED RINGERS IV SOLN: INTRAVENOUS | Status: DC | PRN

## 2020-05-10 MED ORDER — PROPOFOL 10 MG/ML IV BOLUS
INTRAVENOUS | Status: DC | PRN
  Administered 2020-05-10: 100 mg via INTRAVENOUS

## 2020-05-10 MED ORDER — SUCCINYLCHOLINE CHLORIDE 20 MG/ML IJ SOLN
INTRAMUSCULAR | Status: DC | PRN
  Administered 2020-05-10: 80 mg via INTRAVENOUS

## 2020-05-10 MED ORDER — POTASSIUM CHLORIDE 10 MEQ/100ML IV SOLN
10.0000 meq | INTRAVENOUS | Status: AC
  Administered 2020-05-10 (×3): 10 meq via INTRAVENOUS
  Filled 2020-05-10 (×3): qty 100

## 2020-05-10 MED ORDER — DEXTROSE-NACL 5-0.9 % IV SOLN: INTRAVENOUS | Status: DC

## 2020-05-10 MED ORDER — LIDOCAINE 2% (20 MG/ML) 5 ML SYRINGE
INTRAMUSCULAR | Status: DC | PRN
  Administered 2020-05-10: 60 mg via INTRAVENOUS

## 2020-05-10 MED ORDER — PHENYLEPHRINE 40 MCG/ML (10ML) SYRINGE FOR IV PUSH (FOR BLOOD PRESSURE SUPPORT)
PREFILLED_SYRINGE | INTRAVENOUS | Status: DC | PRN
  Administered 2020-05-10: 80 ug via INTRAVENOUS

## 2020-05-10 SURGICAL SUPPLY — 15 items

## 2020-05-10 NOTE — Anesthesia Procedure Notes (Signed)
Procedure Name: Intubation Date/Time: 05/10/2020 1:48 PM Performed by: Trinna Post., CRNA Pre-anesthesia Checklist: Patient identified, Emergency Drugs available, Suction available, Timeout performed and Patient being monitored Patient Re-evaluated:Patient Re-evaluated prior to induction Oxygen Delivery Method: Circle system utilized Preoxygenation: Pre-oxygenation with 100% oxygen Induction Type: IV induction and Rapid sequence Laryngoscope Size: Mac and 3 Grade View: Grade I Tube type: Oral Tube size: 7.5 mm Number of attempts: 1 Airway Equipment and Method: Stylet Placement Confirmation: ETT inserted through vocal cords under direct vision,  positive ETCO2 and breath sounds checked- equal and bilateral Secured at: 22 cm Tube secured with: Tape Dental Injury: Teeth and Oropharynx as per pre-operative assessment

## 2020-05-10 NOTE — Evaluation (Signed)
Physical Therapy Evaluation Patient Details Name: Natalie Spencer MRN: [id number] DOB: 10/14/1926 Today's Date: 05/10/2020   History of Present Illness  Natalie Spencer is a 87 y/o female who reported to ED from SNF due to vomiting and hematemesis. Pt admitted on 05/09/20 with acute upper GI bleed. PMH includes dementia, HTN, TBI, chronic anemia, and prior CVA.    Clinical Impression  Pt received in bed, pleasantly confused. Guardian present for first few minutes of session and reporting that pt is close to baseline cognitively. Pt continuously talking in happy tone throughout session about family and as if she had met PTs previously. Inconsistently follows simple commands and demonstrates decreased initiation. Was able to participate in bed mobility after PT initiating movement at her legs. However, made little initiation to stand and required max Ax2 to clear hips just inches off the bed. Unable to complete sit to stand. Left in bed with all needs met, call bell within reach, bed alarm active, and sitter present in room. Pt would benefit from PT to improve balance, decrease risk for falls, maximize independency, and return to PLOF. Will continue to follow acutely.    Follow Up Recommendations SNF;Supervision/Assistance - 24 hour    Equipment Recommendations  Wheelchair cushion (measurements PT);Wheelchair (measurements PT);Rolling walker with 5" wheels;3in1 (PT)    Recommendations for Other Services       Precautions / Restrictions Precautions Precautions: Fall Precaution Comments: monitor HR Restrictions Weight Bearing Restrictions: No      Mobility  Bed Mobility Overal bed mobility: Needs Assistance Bed Mobility: Supine to Sit;Sit to Supine     Supine to sit: Min assist Sit to supine: Max assist;+2 for physical assistance   General bed mobility comments: Min A to help bring trunk upright    Transfers Overall transfer level: Needs assistance Equipment used: Rolling walker (2  wheeled) Transfers: Sit to/from Stand Sit to Stand: Max assist;+2 physical assistance         General transfer comment: Attempted sit to stand but unable to come into full stand  Ambulation/Gait             General Gait Details: unable  Stairs            Wheelchair Mobility    Modified Rankin (Stroke Patients Only)       Balance Overall balance assessment: Needs assistance Sitting-balance support: Feet supported Sitting balance-Leahy Scale: Poor Sitting balance - Comments: Able to sit for brief periods at min guard level but generally required external assistance. Did not recognize washcloth while sitting at EOB and did not know what to do with it even after cueing     Standing balance-Leahy Scale: Zero                               Pertinent Vitals/Pain Pain Assessment: Faces Faces Pain Scale: No hurt Pain Intervention(s): Monitored during session    Home Living Family/patient expects to be discharged to:: Skilled nursing facility                      Prior Function           Comments: Pt poor historian. Per RN, pt was walking short distances with assistance at facility     Hand Dominance        Extremity/Trunk Assessment   Upper Extremity Assessment Upper Extremity Assessment: Overall WFL for tasks assessed    Lower Extremity Assessment Lower Extremity Assessment:  Generalized weakness    Cervical / Trunk Assessment Cervical / Trunk Assessment: Kyphotic  Communication      Cognition Arousal/Alertness: Awake/alert Behavior During Therapy: WFL for tasks assessed/performed Overall Cognitive Status: History of cognitive impairments - at baseline Area of Impairment: Following commands;Awareness;Problem solving;Attention;Memory                   Current Attention Level: Sustained Memory: Decreased short-term memory Following Commands: Follows one step commands inconsistently;Follows one step commands with  increased time   Awareness: Emergent Problem Solving: Slow processing;Decreased initiation;Difficulty sequencing;Requires tactile cues;Requires verbal cues        General Comments General comments (skin integrity, edema, etc.): HR stable    Exercises     Assessment/Plan    PT Assessment Patient needs continued PT services  PT Problem List Decreased strength;Decreased mobility;Decreased safety awareness;Decreased knowledge of precautions;Decreased activity tolerance;Decreased balance;Decreased cognition;Decreased knowledge of use of DME       PT Treatment Interventions DME instruction;Therapeutic exercise;Balance training;Gait training;Neuromuscular re-education;Functional mobility training;Therapeutic activities;Patient/family education    PT Goals (Current goals can be found in the Care Plan section)  Acute Rehab PT Goals Patient Stated Goal: get warm PT Goal Formulation: With patient Time For Goal Achievement: 05/24/20 Potential to Achieve Goals: Good    Frequency Min 2X/week   Barriers to discharge        Co-evaluation               AM-PAC PT "6 Clicks" Mobility  Outcome Measure Help needed turning from your back to your side while in a flat bed without using bedrails?: A Little Help needed moving from lying on your back to sitting on the side of a flat bed without using bedrails?: A Little Help needed moving to and from a bed to a chair (including a wheelchair)?: Total Help needed standing up from a chair using your arms (e.g., wheelchair or bedside chair)?: Total Help needed to walk in hospital room?: Total Help needed climbing 3-5 steps with a railing? : Total 6 Click Score: 10    End of Session Equipment Utilized During Treatment: Gait belt Activity Tolerance: Patient limited by fatigue Patient left: in bed;with call bell/phone within reach;with bed alarm set;with nursing/sitter in room Nurse Communication: Mobility status PT Visit Diagnosis:  Unsteadiness on feet (R26.81);History of falling (Z91.81);Muscle weakness (generalized) (M62.81)    Time:  -      Charges:         Rosita Kea, SPT

## 2020-05-10 NOTE — Interval H&P Note (Signed)
History and Physical Interval Note:  05/10/2020 1:46 PM  Natalie Spencer  has presented today for surgery, with the diagnosis of hematemesis.  The various methods of treatment have been discussed with the patient and family. After consideration of risks, benefits and other options for treatment, the patient has consented to  Procedure(s): ESOPHAGOGASTRODUODENOSCOPY (EGD) WITH PROPOFOL (N/A) as a surgical intervention.  The patient's history has been reviewed, patient examined, no change in status, stable for surgery.  I have reviewed the patient's chart and labs.  Questions were answered to the patient's satisfaction.     Yancey Flemings

## 2020-05-10 NOTE — Anesthesia Postprocedure Evaluation (Signed)
Anesthesia Post Note  Patient: Shacola Wheat  Procedure(s) Performed: ESOPHAGOGASTRODUODENOSCOPY (EGD) WITH PROPOFOL (N/A )     Patient location during evaluation: PACU Anesthesia Type: General Level of consciousness: awake and alert, oriented and patient cooperative Pain management: pain level controlled Vital Signs Assessment: post-procedure vital signs reviewed and stable Respiratory status: spontaneous breathing, nonlabored ventilation and respiratory function stable Cardiovascular status: blood pressure returned to baseline and stable Postop Assessment: no apparent nausea or vomiting Anesthetic complications: no   No complications documented.  Last Vitals:  Vitals:   05/10/20 0735 05/10/20 1249  BP: (!) 146/88 (!) 186/92  Pulse: 98   Resp: 19 (!) 22  Temp: 36.7 C 36.7 C  SpO2: 98% 94%    Last Pain:  Vitals:   05/10/20 1249  TempSrc: Tympanic  PainSc: 0-No pain                 Lannie Fields

## 2020-05-10 NOTE — Anesthesia Preprocedure Evaluation (Addendum)
Anesthesia Evaluation  Patient identified by MRN, date of birth, ID band Patient confused    Reviewed: Allergy & Precautions, NPO status , Patient's Chart, lab work & pertinent test results  Airway       Comment: Uncooperative w/ airway exam  Dental  (+) Dental Advisory Given Uncooperative w/ dental exam:   Pulmonary neg pulmonary ROS,    Pulmonary exam normal breath sounds clear to auscultation       Cardiovascular hypertension (poorly controlled), Pt. on medications  Rhythm:Regular Rate:Normal     Neuro/Psych PSYCHIATRIC DISORDERS Dementia CVA, Residual Symptoms    GI/Hepatic Neg liver ROS, hiatal hernia, Hematemesis    Endo/Other  negative endocrine ROS  Renal/GU negative Renal ROS  negative genitourinary   Musculoskeletal negative musculoskeletal ROS (+)   Abdominal   Peds  Hematology  (+) Blood dyscrasia, anemia , Hb 13   Anesthesia Other Findings Very demented, has legal guardian  Reproductive/Obstetrics negative OB ROS                          Anesthesia Physical Anesthesia Plan  ASA: IV  Anesthesia Plan: General   Post-op Pain Management:    Induction: Intravenous and Rapid sequence  PONV Risk Score and Plan: 3 and Ondansetron, Dexamethasone and Treatment may vary due to age or medical condition  Airway Management Planned: Oral ETT  Additional Equipment: None  Intra-op Plan:   Post-operative Plan: Extubation in OR  Informed Consent: I have reviewed the patients History and Physical, chart, labs and discussed the procedure including the risks, benefits and alternatives for the proposed anesthesia with the patient or authorized representative who has indicated his/her understanding and acceptance.   Patient has DNR.  Discussed DNR with power of attorney and Suspend DNR.   Dental advisory given and Consent reviewed with POA  Plan Discussed with: CRNA  Anesthesia Plan  Comments: (D/w legal guardian on phone, decision to suspend DNR- can intubate for procedure and give pressors, however continue to withold chest compressions and defibrillation Pt essentially non-verbal)       Anesthesia Quick Evaluation

## 2020-05-10 NOTE — Transfer of Care (Signed)
Immediate Anesthesia Transfer of Care Note  Patient: [facility]  Procedure(s) Performed: ESOPHAGOGASTRODUODENOSCOPY (EGD) WITH PROPOFOL (N/A )  Patient Location: PACU and Endoscopy Unit  Anesthesia Type:General  Level of Consciousness: awake and drowsy  Airway & Oxygen Therapy: Patient Spontanous Breathing and Patient connected to face mask oxygen  Post-op Assessment: Report given to RN and Post -op Vital signs reviewed and stable  Post vital signs: Reviewed and stable  Last Vitals:  Vitals Value Taken Time  BP    Temp    Pulse    Resp    SpO2      Last Pain:  Vitals:   05/10/20 1249  TempSrc: Tympanic  PainSc: 0-No pain         Complications: No complications documented.

## 2020-05-10 NOTE — Op Note (Signed)
[facility] Patient Name: Natalie Spencer Procedure Date : 05/10/2020 MRN: [id number] Attending MD: Wilhemina Bonito. Marina Goodell , MD Date of Birth: July 30, 1926 CSN: [id number] Age: 87 Admit Type: Inpatient Procedure:                Upper GI endoscopy Indications:              Coffee-ground emesis Providers:                Wilhemina Bonito. Marina Goodell, MD, Charlett Lango, RN, Harrington Challenger,                            Technician, Lillette Boxer, CRNA Referring MD:             [facility] hospitalist Medicines:                Monitored Anesthesia Care Complications:            No immediate complications. Estimated Blood Loss:     Estimated blood loss: none. Procedure:                Pre-Anesthesia Assessment:                           - Prior to the procedure, a History and Physical                            was performed, and patient medications and                            allergies were reviewed. The patient's tolerance of                            previous anesthesia was also reviewed. The risks                            and benefits of the procedure and the sedation                            options and risks were discussed with the patient.                            All questions were answered, and informed consent                            was obtained. Prior Anticoagulants: The patient has                            taken no previous anticoagulant or antiplatelet                            agents. ASA Grade Assessment: III - A patient with                            severe systemic disease. After reviewing the risks  and benefits, the patient was deemed in                            satisfactory condition to undergo the procedure.                           After obtaining informed consent, the endoscope was                            passed under direct vision. Throughout the                            procedure, the patient's blood pressure, pulse, and                             oxygen saturations were monitored continuously. The                            [id number]-H190 ([id number] Olympus gastroscope was                            introduced through the mouth, and advanced to the                            second part of duodenum. The upper GI endoscopy was                            accomplished without difficulty. The patient                            tolerated the procedure well. Scope In: Scope Out: Findings:      The esophagus revealed mild esophagitis.      The stomach was was dilated with moderate volume fluid in the fundus.       There was a small paraesophageal hernia. There is no evidence for       obstruction. Mucosa was atrophic. No mucosal lesions are present. No       blood or bleeding.      The examined duodenum was normal.      The cardia and gastric fundus were normal on retroflexion. Impression:               1. Mild esophagitis                           2. Small paraesophageal hernia                           3. Dilated stomach without obstruction or other                            abnormality                           4. Otherwise normal EGD. Moderate Sedation:      none Recommendation:           1. Pantoprazole 40 mg  twice daily                           2. Diet of choice                           GI will sign off. Procedure Code(s):        --- Professional ---                           [phone], Esophagogastroduodenoscopy, flexible,                            transoral; diagnostic, including collection of                            specimen(s) by brushing or washing, when performed                            (separate procedure) Diagnosis Code(s):        --- Professional ---                           K92.0, Hematemesis CPT copyright 2019 [employer]. All rights reserved. The codes documented in this report are preliminary and upon coder review may  be revised to meet current compliance requirements. Wilhemina Bonito. Marina Goodell,  MD 05/10/2020 2:15:07 PM This report has been signed electronically. Number of Addenda: 0

## 2020-05-10 NOTE — Progress Notes (Signed)
PROGRESS NOTE    Natalie Spencer  WUJ:[id number] DOB: 11/01/1926 DOA: 05/09/2020 PCP: Patient, No Pcp Per (Inactive)   Brief Narrative: 87 year old female with significant medical history with advanced dementia, surgically absent gallbladder, CVA, anemia, essential hypertension, resident of a skilled nursing facility sent to the ED for hematemesis. Originally seen on 4/26 in the ED due to emesis that began the day prior, Gastroccult positive, CT abdomen showed new paraesophageal hiatal hernia, tiny periumbilical ventral hernia containing fat, colonic diverticulosis hemoglobin was 14.1 g lipase normal total bili minimally elevated otherwise normal LFT troponin minimally elevated no ischemic change on EKG, given Protonix Zofran and recommended PCP follow-up however patient return to the ED with recurrent emesis from the facility vomited bright red blood. In the ED was hypotensive initially, mildly tachycardic saturating well on room air, hemoglobin 14.9 g WBC elevated 14.3 total bili 2.0 CT abdomen pelvis with contrast was repeated and no acute finding, GI was consulted patient was admitted for further management  Subjective: Seen this morning mumbling words, private sitter at the bedside Agitation overnight received low-dose Ativan 0.5 mg  Assessment & Plan:  Hematemesis: Suspecting due to paraesophageal hernia, possible intermittent obstruction,?  Esophagitis.  GI on board planning for endoscopy evaluation.  Hemoglobin overall stable 13.5 g.  Continue IV fluids, Protonix drip.  Monitor serial hemoglobin Recent Labs  Lab 05/08/20 1506 05/09/20 0942 05/09/20 2041 05/10/20 0325  HGB 14.1 14.9 15.1* 13.5  HCT 45.7 48.4* 47.8* 42.1   Lactic acidosis in the mid twos range suspecting due to hematemesis dehydration hydrating with IV fluids.  Hypokalemia:we will replete with IV potassium chloride Recent Labs  Lab 05/08/20 1506 05/09/20 0942 05/10/20 0325  K 3.6 3.3* 3.2*   Hypertension; on  Norvasc as outpatient.  BP fairly stable.  Holding home meds. Continue as needed antihypertensive  Leukocytosis likely due to #1.  Resolving without intervention.  CT abdomen pelvis no evidence of intra-abdominal process or infection, no fever. Recent Labs  Lab 05/08/20 1506 05/09/20 0942 05/10/20 0325  WBC 9.6 14.3* 11.2*   Advanced dementia/CVA hx: Engineer, siterivate sitter in place.  On Ativan for agitation-continue with caution. Continue supportive care.   GOC: Patient with advanced dementia elderly debilitated multiple comorbidities.  Will benefit with palliative care evaluation, goc, advance care planning and consult placed.   Diet Order            Diet NPO time specified  Diet effective now                 Patient's There is no height or weight on file to calculate BMI. DVT prophylaxis: SCDs Start: 05/09/20 1548 Code Status:   Code Status: Full Code  Family Communication: called and spoke w/ legal guardian [name] w/ corporation of guardianship-she tells me she is with DNR status at [facility] and agrees to list as DNR here. She understands patient is at a high risk given her advanced dementia or legs.  Status NF:AOZHYQMVis:Admitted as observation Remains hospitalized for management of hematemesis further endoscopic and GI evaluation. Dispo: The patient is from: SNF              Anticipated d/c is to: SNF              Patient currently is not medically stable to d/c.   Difficult to place patient No  Unresulted Labs (From admission, onward)          Start     Ordered   05/11/20 0500  CBC  Daily,   R     Question:  Specimen collection method  Answer:  Lab=Lab collect   05/10/20 0825   05/11/20 0500  Comprehensive metabolic panel  Daily,   R     Question:  Specimen collection method  Answer:  Lab=Lab collect   05/10/20 0825   05/10/20 1000  Hemoglobin and hematocrit, blood  Once,   R        05/10/20 1000         Medications reviewed:  Scheduled Meds: Continuous Infusions: . dextrose 5  % and 0.9% NaCl 75 mL/hr at 05/10/20 0900  . pantoprozole (PROTONIX) infusion 8 mg/hr (05/10/20 0725)  . potassium chloride 10 mEq (05/10/20 1114)    Consultants:see note  Procedures:see note  Antimicrobials: Anti-infectives (From admission, onward)   None     Culture/Microbiology    Component Value Date/Time   SDES URINE, RANDOM 05/08/2020 1955   SPECREQUEST  05/08/2020 1955    NONE Performed at [facility], [address], [place], [place] [address]    CULT MULTIPLE SPECIES PRESENT, SUGGEST RECOLLECTION (A) 05/08/2020 1955   REPTSTATUS 05/10/2020 FINAL 05/08/2020 1955    Other culture-see note  Objective: Vitals: Today's Vitals   05/10/20 0154 05/10/20 0427 05/10/20 0610 05/10/20 0735  BP:  (!) 145/76 (!) 155/82 (!) 146/88  Pulse:  88 100 98  Resp:  16 18 19   Temp:  98.3 F (36.8 C) 98.3 F (36.8 C) 98 F (36.7 C)  TempSrc:  Oral Axillary Axillary  SpO2:  99% 98% 98%  PainSc: Asleep   0-No pain    Intake/Output Summary (Last 24 hours) at 05/10/2020 1122 Last data filed at 05/10/2020 0400 Gross per 24 hour  Intake 747.23 ml  Output --  Net 747.23 ml   There were no vitals filed for this visit. Weight change:   Intake/Output from previous day: 04/27 0701 - 04/28 0700 In: 747.2 [I.V.:747.2] Out: -  Intake/Output this shift: No intake/output data recorded. There were no vitals filed for this visit.  Examination: General exam:Demented confused mumbling words elderly lady frail looking on room air  HEENT:Oral mucosa dryt, Ear/Nose WNL grossly,dentition normal. Respiratory system: bilaterally diminished, and in no use of accessory muscle, non tender. Cardiovascular system: S1 & S2 +,regular, No JVD. Gastrointestinal system: Abdomen soft, NT,ND, BS+. Nervous System: Demented, does not follow commands, mumbling words Extremities: no edema, distal peripheral pulses palpable.  Skin: No rashes,no icterus. MSK: Normal muscle bulk,tone, power.  Data  Reviewed: I have personally reviewed following labs and imaging studies CBC: Recent Labs  Lab 05/08/20 1506 05/09/20 0942 05/09/20 2041 05/10/20 0325  WBC 9.6 14.3*  --  11.2*  NEUTROABS 7.6 10.9*  --  8.4*  HGB 14.1 14.9 15.1* 13.5  HCT 45.7 48.4* 47.8* 42.1  MCV 92.1 92.7  --  89.2  PLT 381 329  --  369   Basic Metabolic Panel: Recent Labs  Lab 05/08/20 1506 05/09/20 0942 05/09/20 1600 05/10/20 0325  NA 139 141  --  142  K 3.6 3.3*  --  3.2*  CL 101 104  --  103  CO2 27 25  --  28  GLUCOSE 128* 102*  --  115*  BUN 15 16  --  19  CREATININE 0.82 0.93  --  0.92  CALCIUM 10.0 10.2  --  9.7  MG  --   --  2.2 2.1   GFR: Estimated Creatinine Clearance: 28 mL/min (by C-G formula based on SCr  of 0.92 mg/dL). Liver Function Tests: Recent Labs  Lab 05/08/20 1506 05/09/20 0942 05/10/20 0325  AST 21 23 21   ALT 11 12 12   ALKPHOS 75 80 70  BILITOT 1.5* 2.0* 1.6*  PROT 7.6 8.5* 7.0  ALBUMIN 3.9 4.2 3.6   Recent Labs  Lab 05/08/20 1506 05/09/20 0942  LIPASE 26 27   No results for input(s): AMMONIA in the last 168 hours. Coagulation Profile: Recent Labs  Lab 05/09/20 1600 05/10/20 0325  INR 1.0 1.1   Cardiac Enzymes: No results for input(s): CKTOTAL, CKMB, CKMBINDEX, TROPONINI in the last 168 hours. BNP (last 3 results) No results for input(s): PROBNP in the last 8760 hours. HbA1C: No results for input(s): HGBA1C in the last 72 hours. CBG: No results for input(s): GLUCAP in the last 168 hours. Lipid Profile: No results for input(s): CHOL, HDL, LDLCALC, TRIG, CHOLHDL, LDLDIRECT in the last 72 hours. Thyroid Function Tests: No results for input(s): TSH, T4TOTAL, FREET4, T3FREE, THYROIDAB in the last 72 hours. Anemia Panel: Recent Labs    05/09/20 2041  FERRITIN 49  TIBC 312  IRON 33   Sepsis Labs: Recent Labs  Lab 05/09/20 1429 05/09/20 1728  LATICACIDVEN 2.0* 2.6*    Recent Results (from the past 240 hour(s))  Urine culture     Status:  Abnormal   Collection Time: 05/08/20  7:55 PM   Specimen: Urine, Random  Result Value Ref Range Status   Specimen Description URINE, RANDOM  Final   Special Requests   Final    NONE Performed at [facility], [address], [place], [address] [place]    Culture MULTIPLE SPECIES PRESENT, SUGGEST RECOLLECTION (A)  Final   Report Status 05/10/2020 FINAL  Final  Resp Panel by RT-PCR (Flu A&B, Covid) Nasopharyngeal Swab     Status: None   Collection Time: 05/09/20  3:35 PM   Specimen: Nasopharyngeal Swab; Nasopharyngeal(NP) swabs in vial transport medium  Result Value Ref Range Status   SARS Coronavirus 2 by RT PCR NEGATIVE NEGATIVE Final    Comment: (NOTE) SARS-CoV-2 target nucleic acids are NOT DETECTED.  The SARS-CoV-2 RNA is generally detectable in upper respiratory specimens during the acute phase of infection. The lowest concentration of SARS-CoV-2 viral copies this assay can detect is 138 copies/mL. A negative result does not preclude SARS-Cov-2 infection and should not be used as the sole basis for treatment or other patient management decisions. A negative result may occur with  improper specimen collection/handling, submission of specimen other than nasopharyngeal swab, presence of viral mutation(s) within the areas targeted by this assay, and inadequate number of viral copies(<138 copies/mL). A negative result must be combined with clinical observations, patient history, and epidemiological information. The expected result is Negative.  Fact Sheet for Patients:  05/12/2020  Fact Sheet for Healthcare Providers:  05/11/20  This test is no t yet approved or cleared by the [employer] FDA and  has been authorized for detection and/or diagnosis of SARS-CoV-2 by FDA under an Emergency Use Authorization (EUA). This EUA will remain  in effect (meaning this test can be used) for the duration of  the COVID-19 declaration under Section 564(b)(1) of the Act, 21 U.S.C.section 360bbb-3(b)(1), unless the authorization is terminated  or revoked sooner.       Influenza A by PCR NEGATIVE NEGATIVE Final   Influenza B by PCR NEGATIVE NEGATIVE Final    Comment: (NOTE) The Xpert Xpress SARS-CoV-2/FLU/RSV plus assay is intended as an aid in the diagnosis  of influenza from Nasopharyngeal swab specimens and should not be used as a sole basis for treatment. Nasal washings and aspirates are unacceptable for Xpert Xpress SARS-CoV-2/FLU/RSV testing.  Fact Sheet for Patients: [employer]  Fact Sheet for Healthcare Providers: [employer]  This test is not yet approved or cleared by the [place] FDA and has been authorized for detection and/or diagnosis of SARS-CoV-2 by FDA under an Emergency Use Authorization (EUA). This EUA will remain in effect (meaning this test can be used) for the duration of the COVID-19 declaration under Section 564(b)(1) of the Act, 21 U.S.C. section 360bbb-3(b)(1), unless the authorization is terminated or revoked.  Performed at [facility], [address], [place], [place] [address]      Radiology Studies: CT ABDOMEN PELVIS WO CONTRAST  Result Date: 05/09/2020 CLINICAL DATA:  Abdominal pain.  Vomiting blood. EXAM: CT ABDOMEN AND PELVIS WITHOUT CONTRAST TECHNIQUE: Multidetector CT imaging of the abdomen and pelvis was performed following the standard protocol without IV contrast. COMPARISON:  05/08/2020 FINDINGS: Lower chest: Paraesophageal hiatal hernia as seen yesterday. Mild atelectasis at the left lung base. Hepatobiliary: Previous cholecystectomy. Liver parenchyma appears normal. Pancreas: Normal Spleen: Normal Adrenals/Urinary Tract: Adrenal glands are normal. Some residual excreted contrast. No evidence of hydronephrosis or pyelonephritis. Bladder contains contrast and appears normal.  Stomach/Bowel: Paraesophageal hernia as noted above. Stomach is full of fluid. Small bowel pattern is normal. Appendix is normal. Diverticulosis of the left colon without visible diverticulitis. Tiny paraumbilical ventral hernia containing only fat. Vascular/Lymphatic: Aortic atherosclerosis. No aneurysm. IVC is normal. No retroperitoneal adenopathy. Reproductive: Negative Other: No free fluid or air. Musculoskeletal: Old L4 compression fracture. Old T9 compression fracture. Curvature in chronic degenerative changes of the spine. IMPRESSION: 1. No change since yesterday. No acute finding by CT. Paraesophageal hiatal hernia as seen yesterday. 2. Diverticulosis of the left colon without visible diverticulitis. 3. Aortic atherosclerosis. 4. Old L4 and T9 compression fractures. Curvature and chronic degenerative changes of the spine. Aortic Atherosclerosis (ICD10-I70.0). Electronically Signed   By: Paulina Fusi M.D.   On: 05/09/2020 14:10   CT ABDOMEN PELVIS W CONTRAST  Result Date: 05/08/2020 CLINICAL DATA:  Vomiting with hematemesis for 2 days. Possible bowel obstruction. EXAM: CT ABDOMEN AND PELVIS WITH CONTRAST TECHNIQUE: Multidetector CT imaging of the abdomen and pelvis was performed using the standard protocol following bolus administration of intravenous contrast. CONTRAST:  OMNIPAQUE IOHEXOL 300 MG/ML  SOLN COMPARISON:  07/03/2018 FINDINGS: Lower Chest: No acute findings. Hepatobiliary: No hepatic masses identified. Prior cholecystectomy. No evidence of biliary obstruction. Pancreas:  No mass or inflammatory changes. Spleen: Within normal limits in size and appearance. Adrenals/Urinary Tract: No masses identified. No evidence of ureteral calculi or hydronephrosis. Stomach/Bowel: New moderate sized paraesophageal hiatal hernia is seen. No evidence of obstruction, inflammatory process or abnormal fluid collections. Normal appendix visualized. Diverticulosis is seen mainly involving the sigmoid colon,  however there is no evidence of diverticulitis. Stable tiny paraumbilical ventral hernia containing only fat. Vascular/Lymphatic: No pathologically enlarged lymph nodes. No acute vascular findings. Aortic atherosclerotic calcification noted. Reproductive:  No mass or other significant abnormality. Other:  None. Musculoskeletal: No suspicious bone lesions identified. Old L4 vertebral body compression fracture again noted. IMPRESSION: No evidence of bowel obstruction. New moderate paraesophageal hiatal hernia. Stable tiny paraumbilical ventral hernia, which contains only fat. Colonic diverticulosis, without radiographic evidence of diverticulitis. Aortic Atherosclerosis (ICD10-I70.0). Electronically Signed   By: Danae Orleans M.D.   On: 05/08/2020 18:40     LOS: 0 days  Lanae Boast, MD [facility]  05/10/2020, 11:22 AM

## 2020-05-10 NOTE — Care Management Obs Status (Addendum)
MEDICARE OBSERVATION STATUS NOTIFICATION   Patient Details  Name: [name] MRN: [id number] Date of Birth: 05-12-1926   Medicare Observation Status Notification Given:  Yes Obs status given to patient but explained to legal guardian Eliot Ford (due to patient has dementia). Observation status was given and patient changed to inpatient status after. Family has been made aware that the observation status was cancelled out by inpatient status.    Beckie Busing, RN 05/10/2020, 11:37 AM

## 2020-05-11 ENCOUNTER — Encounter (HOSPITAL_COMMUNITY): Payer: Self-pay | Admitting: Internal Medicine

## 2020-05-11 DIAGNOSIS — K922 Gastrointestinal hemorrhage, unspecified: Secondary | ICD-10-CM | POA: Diagnosis not present

## 2020-05-11 LAB — CBC
HCT: 33.9 % — ABNORMAL LOW (ref 36.0–46.0)
Hemoglobin: 10.7 g/dL — ABNORMAL LOW (ref 12.0–15.0)
MCH: 28.6 pg (ref 26.0–34.0)
MCHC: 31.6 g/dL (ref 30.0–36.0)
MCV: 90.6 fL (ref 80.0–100.0)
Platelets: 289 10*3/uL (ref 150–400)
RBC: 3.74 MIL/uL — ABNORMAL LOW (ref 3.87–5.11)
RDW: 13.6 % (ref 11.5–15.5)
WBC: 8.2 10*3/uL (ref 4.0–10.5)
nRBC: 0 % (ref 0.0–0.2)

## 2020-05-11 LAB — RESP PANEL BY RT-PCR (FLU A&B, COVID) ARPGX2
Influenza A by PCR: NEGATIVE
Influenza B by PCR: NEGATIVE
SARS Coronavirus 2 by RT PCR: NEGATIVE

## 2020-05-11 LAB — COMPREHENSIVE METABOLIC PANEL
ALT: 11 U/L (ref 0–44)
AST: 21 U/L (ref 15–41)
Albumin: 2.8 g/dL — ABNORMAL LOW (ref 3.5–5.0)
Alkaline Phosphatase: 50 U/L (ref 38–126)
Anion gap: 4 — ABNORMAL LOW (ref 5–15)
BUN: 21 mg/dL (ref 8–23)
CO2: 28 mmol/L (ref 22–32)
Calcium: 8.9 mg/dL (ref 8.9–10.3)
Chloride: 111 mmol/L (ref 98–111)
Creatinine, Ser: 0.93 mg/dL (ref 0.44–1.00)
GFR, Estimated: 58 mL/min — ABNORMAL LOW (ref >60)
Glucose, Bld: 101 mg/dL — ABNORMAL HIGH (ref 70–99)
Potassium: 3.3 mmol/L — ABNORMAL LOW (ref 3.5–5.1)
Sodium: 143 mmol/L (ref 135–145)
Total Bilirubin: 1.5 mg/dL — ABNORMAL HIGH (ref 0.3–1.2)
Total Protein: 5.6 g/dL — ABNORMAL LOW (ref 6.5–8.1)

## 2020-05-11 MED ORDER — POTASSIUM CHLORIDE 10 MEQ/100ML IV SOLN
10.0000 meq | INTRAVENOUS | Status: DC
  Administered 2020-05-11: 10 meq via INTRAVENOUS
  Filled 2020-05-11: qty 100

## 2020-05-11 MED ORDER — POTASSIUM CHLORIDE CRYS ER 20 MEQ PO TBCR
30.0000 meq | EXTENDED_RELEASE_TABLET | Freq: Once | ORAL | Status: AC
  Administered 2020-05-11: 30 meq via ORAL
  Filled 2020-05-11: qty 1

## 2020-05-11 MED ORDER — PANTOPRAZOLE SODIUM 40 MG PO TBEC: 40.0000 mg | DELAYED_RELEASE_TABLET | Freq: Two times a day (BID) | ORAL | Status: AC

## 2020-05-11 MED ORDER — PANTOPRAZOLE SODIUM 40 MG PO TBEC
40.0000 mg | DELAYED_RELEASE_TABLET | Freq: Two times a day (BID) | ORAL | Status: DC
  Administered 2020-05-11: 40 mg via ORAL
  Filled 2020-05-11: qty 1

## 2020-05-11 NOTE — Evaluation (Signed)
Occupational Therapy Evaluation Patient Details Name: Natalie Spencer MRN: [id number] DOB: [birth year] Today's Date: 05/11/2020    History of Present Illness Natalie Spencer is a 87 y/o female who reported to ED from SNF due to vomiting and hematemesis. Pt admitted on 05/09/20 with acute upper GI bleed. PMH includes dementia, HTN, TBI, chronic anemia, and prior CVA.   Clinical Impression   Pt. Was seen for OT evaluation. Pt. Was cooperative but was inconsistent with following 1 step commands. Pt. Is blind in L eye and pt. Has decreased vision in R eye. Unknown if pt. Wears glasses. Her caregiver in room is new and did not know. Pt. Is not accurate historian. Pt. Has decreased B UE strength. Pt. Is Dep for ADLs and with pt. Vision and cognition it appears to be her baseline. No further acute OT. Pt. Would benefit from further OT at SNF to assess for changes from baseline.     Follow Up Recommendations  SNF (OT at SNF can assess changes from baseline)    Equipment Recommendations  None recommended by OT    Recommendations for Other Services       Precautions / Restrictions Precautions Precautions: Fall Precaution Comments: monitor HR Restrictions Weight Bearing Restrictions: No      Mobility Bed Mobility Overal bed mobility: Needs Assistance Bed Mobility: Supine to Sit;Sit to Supine     Supine to sit: Mod assist Sit to supine: Mod assist;+2 for physical assistance        Transfers Overall transfer level: Needs assistance Equipment used: Rolling walker (2 wheeled)                  Balance     Sitting balance-Leahy Scale: Poor                                     ADL either performed or assessed with clinical judgement   ADL Overall ADL's : At baseline (Pt. needs extensive assist.)                                       General ADL Comments: per chart she came from snf and required extensive assit wtih adls.     Vision Baseline  Vision/History:  (Pt. is blind in L eye. Pt. appears to be low vision in R eye but is able to see shapes. It is difficult to assess secondary to cognition.) Patient Visual Report:  (pt. is not a accurite historian.)       Perception     Praxis      Pertinent Vitals/Pain Pain Assessment: No/denies pain     Hand Dominance Left   Extremity/Trunk Assessment Upper Extremity Assessment Upper Extremity Assessment: Generalized weakness (Pt. unable to lift B shld against gravity. AAROM for B shld 90. Pt. distal AROM is wnl)           Communication Communication Communication: No difficulties   Cognition Arousal/Alertness: Awake/alert Behavior During Therapy: WFL for tasks assessed/performed Overall Cognitive Status: History of cognitive impairments - at baseline (per chart her cognition is at baseline) Area of Impairment: Following commands;Awareness;Problem solving;Attention;Memory                     Memory: Decreased short-term memory Following Commands: Follows one step commands inconsistently;Follows one step commands with increased time  Problem Solving: Slow processing;Decreased initiation;Difficulty sequencing;Requires tactile cues;Requires verbal cues     General Comments       Exercises     Shoulder Instructions      Home Living Family/patient expects to be discharged to:: Skilled nursing facility                                        Prior Functioning/Environment Level of Independence:  (per chart her cognition is near baseline and she required extensive assist wtih ADLs)                 OT Problem List: Decreased range of motion;Decreased strength;Decreased activity tolerance;Impaired balance (sitting and/or standing);Decreased cognition;Decreased safety awareness      OT Treatment/Interventions:      OT Goals(Current goals can be found in the care plan section) Acute Rehab OT Goals Patient Stated Goal: none stated  OT  Frequency:     Barriers to D/C:            Co-evaluation              AM-PAC OT "6 Clicks" Daily Activity     Outcome Measure Help from another person eating meals?: Total Help from another person taking care of personal grooming?: Total Help from another person toileting, which includes using toliet, bedpan, or urinal?: Total Help from another person bathing (including washing, rinsing, drying)?: Total Help from another person to put on and taking off regular upper body clothing?: Total Help from another person to put on and taking off regular lower body clothing?: Total 6 Click Score: 6   End of Session Equipment Utilized During Treatment: Oxygen Nurse Communication:  (ok therapy)  Activity Tolerance: Patient tolerated treatment well Patient left: in bed;with call bell/phone within reach;with nursing/sitter in room  OT Visit Diagnosis: Unsteadiness on feet (R26.81);Other abnormalities of gait and mobility (R26.89);Low vision, both eyes (H54.2)                Time: 0998-3382 OT Time Calculation (min): 24 min Charges:  OT General Charges $OT Visit: 1 Visit OT Evaluation $OT Eval Moderate Complexity: 1 Mod  Derrek Gu OT/L   Oliviarose Punch 05/11/2020, 9:36 AM

## 2020-05-11 NOTE — Discharge Summary (Signed)
Physician Discharge Summary  [facility] NTI:[id number] DOB: February 12, 1926 DOA: 05/09/2020  PCP: Patient, No Pcp Per (Inactive)  Admit date: 05/09/2020 Discharge date: 05/11/2020  Admitted From: [facility] Disposition:  [facility]  Recommendations for Outpatient Follow-up:  1. Follow up with PCP in 1-2 weeks 2. Please obtain BMP/CBC in one week  Home Health:no  Equipment/Devices: none  Discharge Condition: Stable Code Status:   Code Status: DNR Diet recommendation:  Diet Order            Diet - low sodium heart healthy           DIET SOFT Room service appropriate? Yes; Fluid consistency: Thin  Diet effective now                  Brief/Interim Summary:87 year old female with significant medical history with advanced dementia, surgically absent gallbladder, CVA, anemia, essential hypertension, resident of a skilled nursing facility sent to the ED for hematemesis. Originally seen on 4/26 in the ED due to emesis that began the day prior, Gastroccult positive, CT abdomen showed new paraesophageal hiatal hernia, tiny periumbilical ventral hernia containing fat, colonic diverticulosis hemoglobin was 14.1 g lipase normal total bili minimally elevated otherwise normal LFT troponin minimally elevated no ischemic change on EKG, given Protonix Zofran and recommended PCP follow-up however patient return to the ED with recurrent emesis from the facility vomited bright red blood. In the ED was hypotensive initially, mildly tachycardic saturating well on room air, hemoglobin 14.9 g WBC elevated 14.3 total bili 2.0 CT abdomen pelvis with contrast was repeated and no acute finding, GI was consulted patient was admitted for further management. Patient has slight drop in hemoglobin but suspect hemodilution hydration related.She underwent EGD-that showed mild esophagitis, small paraesophageal hernia, dilated stomach without obstruction or or other abnormalities, otherwise normal EGD GI advised PPI twice a  day and GI signed off. Patient is alert awake oriented to self, being fed by the private staff and not in distress no nausea or vomiting.  Discharge Diagnoses:  Hematemesis: Unclear etiology.  No recurrence.  Question due to esophagitis-versus due to nausea vomiting.  Patient has slight drop in hemoglobin but suspect hemodilution hydration related.  She underwent EGD-that showed mild esophagitis, small paraesophageal hernia, dilated stomach without obstruction or or other abnormalities, otherwise normal EGD GI advised PPI twice a day and GI signed off. Lactic acidosis in the mid twos range suspecting due to hematemesis dehydration hydrated w/ ivf. Hypokalemia:repleted w/ oral kcl. Check bmp in 1 wk at SNF Hypertension; on Norvasc as outpatient.  BP fairly stable. Resume on d/c Leukocytosis likely due to #1.  Resolved.CT abdomen pelvis no evidence of intra-abdominal process or infection, no fever GOC: Patient with advanced dementia elderly debilitated multiple comorbidities.  Will benefit with palliative care evaluation.  I spoke with legal guardian patient is DNR status at SNF and changed.   Consults:  GI  Subjective: Alert awake oriented to self, she [place].  She is appreciative of Dr. Mike Gip her. She has no nausea vomiting or pain. She is being fed by the private staff.  Discharge Exam: Vitals:   05/11/20 0003 05/11/20 0413  BP: 130/67 (!) 159/78  Pulse: 72 76  Resp: 14 14  Temp: (!) 97.4 F (36.3 C) (!) 97.4 F (36.3 C)  SpO2: 90% 92%   General: Pt is alert, awake, oriented x1 not in acute distress Cardiovascular: RRR, S1/S2 +, no rubs, no gallops Respiratory: CTA bilaterally, no wheezing, no  rhonchi Abdominal: Soft, NT, ND, bowel sounds + Extremities: no edema, no cyanosis  Discharge Instructions  Discharge Instructions    Diet - low sodium heart healthy   Complete by: As directed    Discharge instructions   Complete by: As directed     Please call call MD or return to ER for similar or worsening recurring problem that brought you to hospital or if any fever,nausea/vomiting,abdominal pain, uncontrolled pain, chest pain,  shortness of breath or any other alarming symptoms.  Please follow-up your doctor as instructed in a week time and and check CBC and BMP   You were cared for by a hospitalist during your hospital stay. If you have any questions about your discharge medications or the care you received while you were in the hospital after you are discharged, you can call the unit and ask to speak with the hospitalist on call if the hospitalist that took care of you is not available.  Once you are discharged, your primary care physician will handle any further medical issues. Please note that NO REFILLS for any discharge medications will be authorized once you are discharged, as it is imperative that you return to your primary care physician (or establish a relationship with a primary care physician if you do not have one) for your aftercare needs so that they can reassess your need for medications and monitor your lab values   Increase activity slowly   Complete by: As directed      Allergies as of 05/11/2020   No Known Allergies     Medication List    STOP taking these medications   acetaminophen 325 MG tablet Commonly known as: TYLENOL   erythromycin ophthalmic ointment     TAKE these medications   amLODipine 5 MG tablet Commonly known as: NORVASC Take 5 mg by mouth daily.   LORazepam 0.5 MG tablet Commonly known as: ATIVAN Take 0.25 mg by mouth every 6 (six) hours as needed for anxiety (or agitation).   melatonin 5 MG Tabs Take 5 mg by mouth at bedtime.   ondansetron 4 MG disintegrating tablet Commonly known as: Zofran ODT Take 1 tablet (4 mg total) by mouth every 8 (eight) hours as needed for nausea or vomiting.   pantoprazole 40 MG tablet Commonly known as: PROTONIX Take 1 tablet (40 mg total) by mouth 2  (two) times daily. What changed:   medication strength  when to take this   traMADol 50 MG tablet Commonly known as: ULTRAM Take 1 tablet (50 mg total) by mouth every 6 (six) hours as needed for moderate pain. What changed: reasons to take this       Follow-up Information    Primary care physician at SNF Follow up in 1 week(s).              No Known Allergies  The results of significant diagnostics from this hospitalization (including imaging, microbiology, ancillary and laboratory) are listed below for reference.    Microbiology: Recent Results (from the past 240 hour(s))  Urine culture     Status: Abnormal   Collection Time: 05/08/20  7:55 PM   Specimen: Urine, Random  Result Value Ref Range Status   Specimen Description URINE, RANDOM  Final   Special Requests   Final    NONE Performed at [facility], [address] [place], [place] [address]    Culture MULTIPLE SPECIES PRESENT, SUGGEST RECOLLECTION (A)  Final   Report Status 05/10/2020 FINAL  Final  Resp Panel by RT-PCR (Flu A&B, Covid) Nasopharyngeal Swab     Status: None   Collection Time: 05/09/20  3:35 PM   Specimen: Nasopharyngeal Swab; Nasopharyngeal(NP) swabs in vial transport medium  Result Value Ref Range Status   SARS Coronavirus 2 by RT PCR NEGATIVE NEGATIVE Final    Comment: (NOTE) SARS-CoV-2 target nucleic acids are NOT DETECTED.  The SARS-CoV-2 RNA is generally detectable in upper respiratory specimens during the acute phase of infection. The lowest concentration of SARS-CoV-2 viral copies this assay can detect is 138 copies/mL. A negative result does not preclude SARS-Cov-2 infection and should not be used as the sole basis for treatment or other patient management decisions. A negative result may occur with  improper specimen collection/handling, submission of specimen other than nasopharyngeal swab, presence of viral mutation(s) within the areas targeted by this assay, and inadequate  number of viral copies(<138 copies/mL). A negative result must be combined with clinical observations, patient history, and epidemiological information. The expected result is Negative.  Fact Sheet for Patients:  BloggerCourse.com  Fact Sheet for Healthcare Providers:  SeriousBroker.it  This test is no t yet approved or cleared by the [place] FDA and  has been authorized for detection and/or diagnosis of SARS-CoV-2 by FDA under an Emergency Use Authorization (EUA). This EUA will remain  in effect (meaning this test can be used) for the duration of the COVID-19 declaration under Section 564(b)(1) of the Act, 21 U.S.C.section 360bbb-3(b)(1), unless the authorization is terminated  or revoked sooner.       Influenza A by PCR NEGATIVE NEGATIVE Final   Influenza B by PCR NEGATIVE NEGATIVE Final    Comment: (NOTE) The Xpert Xpress SARS-CoV-2/FLU/RSV plus assay is intended as an aid in the diagnosis of influenza from Nasopharyngeal swab specimens and should not be used as a sole basis for treatment. Nasal washings and aspirates are unacceptable for Xpert Xpress SARS-CoV-2/FLU/RSV testing.  Fact Sheet for Patients: BloggerCourse.com  Fact Sheet for Healthcare Providers: SeriousBroker.it  This test is not yet approved or cleared by the [place] FDA and has been authorized for detection and/or diagnosis of SARS-CoV-2 by FDA under an Emergency Use Authorization (EUA). This EUA will remain in effect (meaning this test can be used) for the duration of the COVID-19 declaration under Section 564(b)(1) of the Act, 21 U.S.C. section 360bbb-3(b)(1), unless the authorization is terminated or revoked.  Performed at [facility], [address], [place], [place] [address]     Procedures/Studies: CT ABDOMEN PELVIS WO CONTRAST  Result Date: 05/09/2020 CLINICAL DATA:   Abdominal pain.  Vomiting blood. EXAM: CT ABDOMEN AND PELVIS WITHOUT CONTRAST TECHNIQUE: Multidetector CT imaging of the abdomen and pelvis was performed following the standard protocol without IV contrast. COMPARISON:  05/08/2020 FINDINGS: Lower chest: Paraesophageal hiatal hernia as seen yesterday. Mild atelectasis at the left lung base. Hepatobiliary: Previous cholecystectomy. Liver parenchyma appears normal. Pancreas: Normal Spleen: Normal Adrenals/Urinary Tract: Adrenal glands are normal. Some residual excreted contrast. No evidence of hydronephrosis or pyelonephritis. Bladder contains contrast and appears normal. Stomach/Bowel: Paraesophageal hernia as noted above. Stomach is full of fluid. Small bowel pattern is normal. Appendix is normal. Diverticulosis of the left colon without visible diverticulitis. Tiny paraumbilical ventral hernia containing only fat. Vascular/Lymphatic: Aortic atherosclerosis. No aneurysm. IVC is normal. No retroperitoneal adenopathy. Reproductive: Negative Other: No free fluid or air. Musculoskeletal: Old L4 compression fracture. Old T9 compression fracture. Curvature in chronic degenerative changes of the spine. IMPRESSION: 1. No change since yesterday.  No acute finding by CT. Paraesophageal hiatal hernia as seen yesterday. 2. Diverticulosis of the left colon without visible diverticulitis. 3. Aortic atherosclerosis. 4. Old L4 and T9 compression fractures. Curvature and chronic degenerative changes of the spine. Aortic Atherosclerosis (ICD10-I70.0). Electronically Signed   By: Paulina Fusi M.D.   On: 05/09/2020 14:10   CT ABDOMEN PELVIS W CONTRAST  Result Date: 05/08/2020 CLINICAL DATA:  Vomiting with hematemesis for 2 days. Possible bowel obstruction. EXAM: CT ABDOMEN AND PELVIS WITH CONTRAST TECHNIQUE: Multidetector CT imaging of the abdomen and pelvis was performed using the standard protocol following bolus administration of intravenous contrast. CONTRAST:  OMNIPAQUE  IOHEXOL 300 MG/ML  SOLN COMPARISON:  07/03/2018 FINDINGS: Lower Chest: No acute findings. Hepatobiliary: No hepatic masses identified. Prior cholecystectomy. No evidence of biliary obstruction. Pancreas:  No mass or inflammatory changes. Spleen: Within normal limits in size and appearance. Adrenals/Urinary Tract: No masses identified. No evidence of ureteral calculi or hydronephrosis. Stomach/Bowel: New moderate sized paraesophageal hiatal hernia is seen. No evidence of obstruction, inflammatory process or abnormal fluid collections. Normal appendix visualized. Diverticulosis is seen mainly involving the sigmoid colon, however there is no evidence of diverticulitis. Stable tiny paraumbilical ventral hernia containing only fat. Vascular/Lymphatic: No pathologically enlarged lymph nodes. No acute vascular findings. Aortic atherosclerotic calcification noted. Reproductive:  No mass or other significant abnormality. Other:  None. Musculoskeletal: No suspicious bone lesions identified. Old L4 vertebral body compression fracture again noted. IMPRESSION: No evidence of bowel obstruction. New moderate paraesophageal hiatal hernia. Stable tiny paraumbilical ventral hernia, which contains only fat. Colonic diverticulosis, without radiographic evidence of diverticulitis. Aortic Atherosclerosis (ICD10-I70.0). Electronically Signed   By: Danae Orleans M.D.   On: 05/08/2020 18:40    Labs: BNP (last 3 results) No results for input(s): BNP in the last 8760 hours. Basic Metabolic Panel: Recent Labs  Lab 05/08/20 1506 05/09/20 0942 05/09/20 1600 05/10/20 0325 05/11/20 0113  NA 139 141  --  142 143  K 3.6 3.3*  --  3.2* 3.3*  CL 101 104  --  103 111  CO2 27 25  --  28 28  GLUCOSE 128* 102*  --  115* 101*  BUN 15 16  --  19 21  CREATININE 0.82 0.93  --  0.92 0.93  CALCIUM 10.0 10.2  --  9.7 8.9  MG  --   --  2.2 2.1  --    Liver Function Tests: Recent Labs  Lab 05/08/20 1506 05/09/20 0942 05/10/20 0325  05/11/20 0113  AST 21 23 21 21   ALT 11 12 12 11   ALKPHOS 75 80 70 50  BILITOT 1.5* 2.0* 1.6* 1.5*  PROT 7.6 8.5* 7.0 5.6*  ALBUMIN 3.9 4.2 3.6 2.8*   Recent Labs  Lab 05/08/20 1506 05/09/20 0942  LIPASE 26 27   No results for input(s): AMMONIA in the last 168 hours. CBC: Recent Labs  Lab 05/08/20 1506 05/09/20 0942 05/09/20 2041 05/10/20 0325 05/10/20 1125 05/11/20 0113  WBC 9.6 14.3*  --  11.2*  --  8.2  NEUTROABS 7.6 10.9*  --  8.4*  --   --   HGB 14.1 14.9 15.1* 13.5 13.0 10.7*  HCT 45.7 48.4* 47.8* 42.1 40.9 33.9*  MCV 92.1 92.7  --  89.2  --  90.6  PLT 381 329  --  369  --  289   Cardiac Enzymes: No results for input(s): CKTOTAL, CKMB, CKMBINDEX, TROPONINI in the last 168 hours. BNP: Invalid input(s): POCBNP CBG: No results for  input(s): GLUCAP in the last 168 hours. D-Dimer No results for input(s): DDIMER in the last 72 hours. Hgb A1c No results for input(s): HGBA1C in the last 72 hours. Lipid Profile No results for input(s): CHOL, HDL, LDLCALC, TRIG, CHOLHDL, LDLDIRECT in the last 72 hours. Thyroid function studies No results for input(s): TSH, T4TOTAL, T3FREE, THYROIDAB in the last 72 hours.  Invalid input(s): FREET3 Anemia work up Recent Labs    05/09/20 2041  FERRITIN 49  TIBC 312  IRON 33   Urinalysis    Component Value Date/Time   COLORURINE YELLOW 05/08/2020 1955   APPEARANCEUR CLEAR 05/08/2020 1955   LABSPEC >1.046 (H) 05/08/2020 1955   PHURINE 7.0 05/08/2020 1955   GLUCOSEU NEGATIVE 05/08/2020 1955   HGBUR NEGATIVE 05/08/2020 1955   BILIRUBINUR NEGATIVE 05/08/2020 1955   KETONESUR 5 (A) 05/08/2020 1955   PROTEINUR NEGATIVE 05/08/2020 1955   NITRITE NEGATIVE 05/08/2020 1955   LEUKOCYTESUR NEGATIVE 05/08/2020 1955   Sepsis Labs Invalid input(s): PROCALCITONIN,  WBC,  LACTICIDVEN Microbiology Recent Results (from the past 240 hour(s))  Urine culture     Status: Abnormal   Collection Time: 05/08/20  7:55 PM   Specimen: Urine,  Random  Result Value Ref Range Status   Specimen Description URINE, RANDOM  Final   Special Requests   Final    NONE Performed at [facility], [address] [place], [place] [address]    Culture MULTIPLE SPECIES PRESENT, SUGGEST RECOLLECTION (A)  Final   Report Status 05/10/2020 FINAL  Final  Resp Panel by RT-PCR (Flu A&B, Covid) Nasopharyngeal Swab     Status: None   Collection Time: 05/09/20  3:35 PM   Specimen: Nasopharyngeal Swab; Nasopharyngeal(NP) swabs in vial transport medium  Result Value Ref Range Status   SARS Coronavirus 2 by RT PCR NEGATIVE NEGATIVE Final    Comment: (NOTE) SARS-CoV-2 target nucleic acids are NOT DETECTED.  The SARS-CoV-2 RNA is generally detectable in upper respiratory specimens during the acute phase of infection. The lowest concentration of SARS-CoV-2 viral copies this assay can detect is 138 copies/mL. A negative result does not preclude SARS-Cov-2 infection and should not be used as the sole basis for treatment or other patient management decisions. A negative result may occur with  improper specimen collection/handling, submission of specimen other than nasopharyngeal swab, presence of viral mutation(s) within the areas targeted by this assay, and inadequate number of viral copies(<138 copies/mL). A negative result must be combined with clinical observations, patient history, and epidemiological information. The expected result is Negative.  Fact Sheet for Patients:  BloggerCourse.comhttps://www.fda.gov/media/152166/download  Fact Sheet for Healthcare Providers:  SeriousBroker.ithttps://www.fda.gov/media/152162/download  This test is no t yet approved or cleared by the [place] FDA and  has been authorized for detection and/or diagnosis of SARS-CoV-2 by FDA under an Emergency Use Authorization (EUA). This EUA will remain  in effect (meaning this test can be used) for the duration of the COVID-19 declaration under Section 564(b)(1) of the Act, 21 U.S.C.section  360bbb-3(b)(1), unless the authorization is terminated  or revoked sooner.       Influenza A by PCR NEGATIVE NEGATIVE Final   Influenza B by PCR NEGATIVE NEGATIVE Final    Comment: (NOTE) The Xpert Xpress SARS-CoV-2/FLU/RSV plus assay is intended as an aid in the diagnosis of influenza from Nasopharyngeal swab specimens and should not be used as a sole basis for treatment. Nasal washings and aspirates are unacceptable for Xpert Xpress SARS-CoV-2/FLU/RSV testing.  Fact Sheet for Patients: BloggerCourse.comhttps://www.fda.gov/media/152166/download  Fact Sheet for Healthcare Providers: SeriousBroker.it  This test is not yet approved or cleared by the [place] FDA and has been authorized for detection and/or diagnosis of SARS-CoV-2 by FDA under an Emergency Use Authorization (EUA). This EUA will remain in effect (meaning this test can be used) for the duration of the COVID-19 declaration under Section 564(b)(1) of the Act, 21 U.S.C. section 360bbb-3(b)(1), unless the authorization is terminated or revoked.  Performed at [facility], [address] [place], [place] [address]      Time coordinating discharge: 25  minutes  SIGNED: Lanae Boast, MD  [facility] 05/11/2020, 9:20 AM  If 7PM-7AM, please contact night-coverage [email or web address]

## 2020-05-11 NOTE — TOC Progression Note (Addendum)
Transition of Care [facility]) - Progression Note    Patient Details  Name: Natalie Spencer MRN: [id number] Date of Birth: 1926/02/03  Transition of Care [facility]) CM/SW Contact  Beckie Busing, RN Phone Number: [phone]  05/11/2020, 12:34 PM  Clinical Narrative:    CM spoke with Joline Salt at [facility] to verify that patient is able to return . Per [place] discharge summary will need to be faxed and new covid test will be needed. D/c summary has been faxed to [phone] and covid test has been requested. Once covid test results are in the patient can be d/c to facility.   Please call [facility] [phone]  Transport will need to be called to [facility] Before 5pm [phone] After 5pm [phone] [facility] will need SS#. This is located on the top of form in d/c packet.  Nurse will need to call transport when patient is ready. Message has been sent to nurse to make aware.         Expected Discharge Plan and Services           Expected Discharge Date: 05/11/20                                     Social Determinants of Health (SDOH) Interventions    Readmission Risk Interventions No flowsheet data found.

## 2020-05-11 NOTE — Progress Notes (Signed)
Physical Therapy Treatment Patient Details Name: Natalie Spencer MRN: [id number] DOB: June 25, 1926 Today's Date: 05/11/2020    History of Present Illness Natalie Spencer is a 87 y/o female who reported to ED from SNF due to vomiting and hematemesis. Pt admitted on 05/09/20 with acute upper GI bleed. PMH includes dementia, HTN, TBI, chronic anemia, and prior CVA.    PT Comments    Pt admitted with above diagnosis. Pt was able to stand to [facility] with mod assist of 2 persons but difficulty maintaining standing for longer than a few seconds making it difficult to clean pt as she was positive for BM.  Was able to clean pt and assist her to chair using Stedy.  Pt pleasantly confused.  Sitter in room on departure.  Pt currently with functional limitations due to balance and endurance deficits. Pt will benefit from skilled PT to increase their independence and safety with mobility to allow discharge to the venue listed below.     Follow Up Recommendations  SNF;Supervision/Assistance - 24 hour     Equipment Recommendations  Wheelchair cushion (measurements PT);Wheelchair (measurements PT);Rolling walker with 5" wheels;3in1 (PT)    Recommendations for Other Services       Precautions / Restrictions Precautions Precautions: Fall Precaution Comments: monitor HR Restrictions Weight Bearing Restrictions: No    Mobility  Bed Mobility Overal bed mobility: Needs Assistance Bed Mobility: Supine to Sit;Sit to Supine     Supine to sit: Mod assist Sit to supine: Mod assist;+2 for physical assistance   General bed mobility comments: Needed assist for LEs and for elevation of trunk as well.    Transfers Overall transfer level: Needs assistance Equipment used:  Antony Salmon) Transfers: Sit to/from Stand Sit to Stand: +2 physical assistance;Mod assist;From elevated surface         General transfer comment: Pt stood to [place].  Once she stood, noted pt had BM on her gown.  PT cleaned pt with pt standing a few times in  [place].  Pt maintains flexed trunk and has a lot of difficulty standing upright fully.  Moved pt to recliner.  Pt has little stamina for standing for any length of time.  Ambulation/Gait                 Stairs             Wheelchair Mobility    Modified Rankin (Stroke Patients Only)       Balance Overall balance assessment: Needs assistance Sitting-balance support: Feet supported Sitting balance-Leahy Scale: Poor Sitting balance - Comments: Able to sit for brief periods at min guard level but generally required external assistance. very flexed posture sitting EOB with kyphosis   Standing balance support: Bilateral upper extremity supported;During functional activity Standing balance-Leahy Scale: Poor Standing balance comment: relies on Stedy to pull up on and external mod assist of 2                            Cognition Arousal/Alertness: Awake/alert Behavior During Therapy: WFL for tasks assessed/performed Overall Cognitive Status: History of cognitive impairments - at baseline (per chart her cognition is at baseline) Area of Impairment: Following commands;Awareness;Problem solving;Attention;Memory                     Memory: Decreased short-term memory Following Commands: Follows one step commands inconsistently;Follows one step commands with increased time     Problem Solving: Slow processing;Decreased initiation;Difficulty sequencing;Requires tactile cues;Requires verbal cues  Exercises General Exercises - Lower Extremity Ankle Circles/Pumps: AROM;Both;10 reps;Supine Long Arc Quad: AROM;Both;10 reps;Seated    General Comments General comments (skin integrity, edema, etc.): VSS      Pertinent Vitals/Pain Faces Pain Scale: No hurt    Home Living                      Prior Function            PT Goals (current goals can now be found in the care plan section) Acute Rehab PT Goals Patient Stated Goal: none  stated Progress towards PT goals: Progressing toward goals    Frequency    Min 2X/week      PT Plan Current plan remains appropriate    Co-evaluation              AM-PAC PT "6 Clicks" Mobility   Outcome Measure  Help needed turning from your back to your side while in a flat bed without using bedrails?: A Little Help needed moving from lying on your back to sitting on the side of a flat bed without using bedrails?: A Little Help needed moving to and from a bed to a chair (including a wheelchair)?: A Lot Help needed standing up from a chair using your arms (e.g., wheelchair or bedside chair)?: A Lot Help needed to walk in hospital room?: Total Help needed climbing 3-5 steps with a railing? : Total 6 Click Score: 12    End of Session Equipment Utilized During Treatment: Gait belt Activity Tolerance: Patient limited by fatigue Patient left: with call bell/phone within reach;with nursing/sitter in room;in chair;with chair alarm set Nurse Communication: Mobility status (use Stedy for transfers) PT Visit Diagnosis: Unsteadiness on feet (R26.81);History of falling (Z91.81);Muscle weakness (generalized) (M62.81)     Time: 2229-7989 PT Time Calculation (min) (ACUTE ONLY): 38 min  Charges:  $Therapeutic Exercise: 8-22 mins $Therapeutic Activity: 23-37 mins                     Junell Cullifer M,PT Acute Rehab Services [phone] (pager)   Bevelyn Buckles 05/11/2020, 4:18 PM

## 2020-05-11 NOTE — Plan of Care (Signed)
  Problem: Education: Goal: Knowledge of General Education information will improve Description: Including pain rating scale, medication(s)/side effects and non-pharmacologic comfort measures Outcome: Adequate for Discharge   Problem: Health Behavior/Discharge Planning: Goal: Ability to manage health-related needs will improve Outcome: Adequate for Discharge   Problem: Clinical Measurements: Goal: Ability to maintain clinical measurements within normal limits will improve Outcome: Adequate for Discharge Goal: Will remain free from infection Outcome: Adequate for Discharge Goal: Diagnostic test results will improve Outcome: Adequate for Discharge Goal: Respiratory complications will improve Outcome: Adequate for Discharge Goal: Cardiovascular complication will be avoided Outcome: Adequate for Discharge   Problem: Activity: Goal: Risk for activity intolerance will decrease Outcome: Adequate for Discharge   Problem: Nutrition: Goal: Adequate nutrition will be maintained Outcome: Adequate for Discharge   Problem: Coping: Goal: Level of anxiety will decrease Outcome: Adequate for Discharge   Problem: Elimination: Goal: Will not experience complications related to bowel motility Outcome: Adequate for Discharge Goal: Will not experience complications related to urinary retention Outcome: Adequate for Discharge   Problem: Pain Managment: Goal: General experience of comfort will improve Outcome: Adequate for Discharge   Problem: Safety: Goal: Ability to remain free from injury will improve Outcome: Adequate for Discharge   Problem: Skin Integrity: Goal: Risk for impaired skin integrity will decrease Outcome: Adequate for Discharge   Problem: Acute Rehab PT Goals(only PT should resolve) Goal: Pt Will Go Supine/Side To Sit Outcome: Adequate for Discharge Goal: Patient Will Transfer Sit To/From Stand Outcome: Adequate for Discharge Goal: Pt Will Transfer Bed To Chair/Chair  To Bed Outcome: Adequate for Discharge Goal: Pt Will Ambulate Outcome: Adequate for Discharge   

## 2020-05-12 LAB — COMPREHENSIVE METABOLIC PANEL
ALT: 10 U/L (ref 0–44)
AST: 20 U/L (ref 15–41)
Albumin: 2.8 g/dL — ABNORMAL LOW (ref 3.5–5.0)
Alkaline Phosphatase: 47 U/L (ref 38–126)
Anion gap: 7 (ref 5–15)
BUN: 21 mg/dL (ref 8–23)
CO2: 27 mmol/L (ref 22–32)
Calcium: 9 mg/dL (ref 8.9–10.3)
Chloride: 109 mmol/L (ref 98–111)
Creatinine, Ser: 0.87 mg/dL (ref 0.44–1.00)
GFR, Estimated: >60 mL/min (ref >60)
Glucose, Bld: 99 mg/dL (ref 70–99)
Potassium: 3.5 mmol/L (ref 3.5–5.1)
Sodium: 143 mmol/L (ref 135–145)
Total Bilirubin: 1.1 mg/dL (ref 0.3–1.2)
Total Protein: 5.6 g/dL — ABNORMAL LOW (ref 6.5–8.1)

## 2020-05-12 LAB — CBC
HCT: 35 % — ABNORMAL LOW (ref 36.0–46.0)
Hemoglobin: 11.2 g/dL — ABNORMAL LOW (ref 12.0–15.0)
MCH: 28.6 pg (ref 26.0–34.0)
MCHC: 32 g/dL (ref 30.0–36.0)
MCV: 89.5 fL (ref 80.0–100.0)
Platelets: 264 10*3/uL (ref 150–400)
RBC: 3.91 MIL/uL (ref 3.87–5.11)
RDW: 13.6 % (ref 11.5–15.5)
WBC: 8.4 10*3/uL (ref 4.0–10.5)
nRBC: 0 % (ref 0.0–0.2)

## 2020-05-12 NOTE — Progress Notes (Signed)
Pt left at this time via gurney with PTAR.

## 2020-06-22 ENCOUNTER — Inpatient Hospital Stay (HOSPITAL_COMMUNITY)
Admission: EM | Admit: 2020-06-22 | Discharge: 2020-06-27 | DRG: 391 | Disposition: A | Discharge status: Skilled Nursing Facility | Payer: Medicare Other | Attending: Internal Medicine | Admitting: Internal Medicine

## 2020-06-22 ENCOUNTER — Encounter (HOSPITAL_COMMUNITY): Payer: Self-pay | Admitting: Emergency Medicine

## 2020-06-22 ENCOUNTER — Emergency Department (HOSPITAL_COMMUNITY): Payer: Medicare Other

## 2020-06-22 DIAGNOSIS — D649 Anemia, unspecified: Secondary | ICD-10-CM | POA: Diagnosis present

## 2020-06-22 DIAGNOSIS — F0390 Unspecified dementia without behavioral disturbance: Secondary | ICD-10-CM | POA: Diagnosis present

## 2020-06-22 DIAGNOSIS — K449 Diaphragmatic hernia without obstruction or gangrene: Secondary | ICD-10-CM | POA: Diagnosis present

## 2020-06-22 DIAGNOSIS — Z8673 Personal history of transient ischemic attack (TIA), and cerebral infarction without residual deficits: Secondary | ICD-10-CM

## 2020-06-22 DIAGNOSIS — I10 Essential (primary) hypertension: Secondary | ICD-10-CM | POA: Diagnosis present

## 2020-06-22 DIAGNOSIS — R933 Abnormal findings on diagnostic imaging of other parts of digestive tract: Secondary | ICD-10-CM | POA: Diagnosis not present

## 2020-06-22 DIAGNOSIS — J189 Pneumonia, unspecified organism: Secondary | ICD-10-CM | POA: Diagnosis not present

## 2020-06-22 DIAGNOSIS — Z79899 Other long term (current) drug therapy: Secondary | ICD-10-CM | POA: Diagnosis not present

## 2020-06-22 DIAGNOSIS — R195 Other fecal abnormalities: Secondary | ICD-10-CM | POA: Diagnosis present

## 2020-06-22 DIAGNOSIS — K2970 Gastritis, unspecified, without bleeding: Secondary | ICD-10-CM | POA: Diagnosis present

## 2020-06-22 DIAGNOSIS — N179 Acute kidney failure, unspecified: Secondary | ICD-10-CM | POA: Diagnosis present

## 2020-06-22 DIAGNOSIS — E860 Dehydration: Secondary | ICD-10-CM | POA: Diagnosis present

## 2020-06-22 DIAGNOSIS — R112 Nausea with vomiting, unspecified: Secondary | ICD-10-CM | POA: Diagnosis present

## 2020-06-22 DIAGNOSIS — Z66 Do not resuscitate: Secondary | ICD-10-CM | POA: Diagnosis present

## 2020-06-22 DIAGNOSIS — R1084 Generalized abdominal pain: Secondary | ICD-10-CM | POA: Diagnosis not present

## 2020-06-22 DIAGNOSIS — Z20822 Contact with and (suspected) exposure to covid-19: Secondary | ICD-10-CM | POA: Diagnosis present

## 2020-06-22 DIAGNOSIS — Z9049 Acquired absence of other specified parts of digestive tract: Secondary | ICD-10-CM | POA: Diagnosis not present

## 2020-06-22 DIAGNOSIS — Z515 Encounter for palliative care: Secondary | ICD-10-CM | POA: Diagnosis not present

## 2020-06-22 DIAGNOSIS — K2210 Ulcer of esophagus without bleeding: Secondary | ICD-10-CM | POA: Diagnosis not present

## 2020-06-22 DIAGNOSIS — E876 Hypokalemia: Secondary | ICD-10-CM | POA: Diagnosis present

## 2020-06-22 DIAGNOSIS — E870 Hyperosmolality and hypernatremia: Secondary | ICD-10-CM | POA: Diagnosis not present

## 2020-06-22 DIAGNOSIS — J159 Unspecified bacterial pneumonia: Secondary | ICD-10-CM | POA: Diagnosis present

## 2020-06-22 DIAGNOSIS — Z87820 Personal history of traumatic brain injury: Secondary | ICD-10-CM

## 2020-06-22 DIAGNOSIS — K3189 Other diseases of stomach and duodenum: Principal | ICD-10-CM | POA: Diagnosis present

## 2020-06-22 DIAGNOSIS — R110 Nausea: Secondary | ICD-10-CM

## 2020-06-22 DIAGNOSIS — Z7189 Other specified counseling: Secondary | ICD-10-CM | POA: Diagnosis not present

## 2020-06-22 LAB — CBC
HCT: 46.2 % — ABNORMAL HIGH (ref 36.0–46.0)
Hemoglobin: 14.7 g/dL (ref 12.0–15.0)
MCH: 28.2 pg (ref 26.0–34.0)
MCHC: 31.8 g/dL (ref 30.0–36.0)
MCV: 88.5 fL (ref 80.0–100.0)
Platelets: 413 10*3/uL — ABNORMAL HIGH (ref 150–400)
RBC: 5.22 MIL/uL — ABNORMAL HIGH (ref 3.87–5.11)
RDW: 13.9 % (ref 11.5–15.5)
WBC: 13.8 10*3/uL — ABNORMAL HIGH (ref 4.0–10.5)
nRBC: 0 % (ref 0.0–0.2)

## 2020-06-22 LAB — COMPREHENSIVE METABOLIC PANEL
ALT: 11 U/L (ref 0–44)
AST: 21 U/L (ref 15–41)
Albumin: 3.8 g/dL (ref 3.5–5.0)
Alkaline Phosphatase: 69 U/L (ref 38–126)
Anion gap: 16 — ABNORMAL HIGH (ref 5–15)
BUN: 33 mg/dL — ABNORMAL HIGH (ref 8–23)
CO2: 26 mmol/L (ref 22–32)
Calcium: 10.2 mg/dL (ref 8.9–10.3)
Chloride: 101 mmol/L (ref 98–111)
Creatinine, Ser: 1.03 mg/dL — ABNORMAL HIGH (ref 0.44–1.00)
GFR, Estimated: 51 mL/min — ABNORMAL LOW (ref >60)
Glucose, Bld: 145 mg/dL — ABNORMAL HIGH (ref 70–99)
Potassium: 2.9 mmol/L — ABNORMAL LOW (ref 3.5–5.1)
Sodium: 143 mmol/L (ref 135–145)
Total Bilirubin: 2 mg/dL — ABNORMAL HIGH (ref 0.3–1.2)
Total Protein: 8.1 g/dL (ref 6.5–8.1)

## 2020-06-22 LAB — POC OCCULT BLOOD, ED: Fecal Occult Bld: POSITIVE — AB

## 2020-06-22 MED ORDER — ACETAMINOPHEN 650 MG RE SUPP: 650.0000 mg | Freq: Four times a day (QID) | RECTAL | Status: DC | PRN

## 2020-06-22 MED ORDER — SODIUM CHLORIDE 0.9 % IV SOLN
500.0000 mg | Freq: Once | INTRAVENOUS | Status: AC
  Administered 2020-06-22: 500 mg via INTRAVENOUS
  Filled 2020-06-22: qty 500

## 2020-06-22 MED ORDER — SODIUM CHLORIDE 0.9 % IV SOLN
2.0000 g | INTRAVENOUS | Status: DC
  Administered 2020-06-23 – 2020-06-25 (×3): 2 g via INTRAVENOUS
  Filled 2020-06-22 (×3): qty 20

## 2020-06-22 MED ORDER — ONDANSETRON 4 MG PO TBDP
8.0000 mg | ORAL_TABLET | Freq: Once | ORAL | Status: AC
  Administered 2020-06-22: 8 mg via ORAL
  Filled 2020-06-22: qty 2

## 2020-06-22 MED ORDER — ACETAMINOPHEN 325 MG PO TABS: 650.0000 mg | ORAL_TABLET | Freq: Four times a day (QID) | ORAL | Status: DC | PRN

## 2020-06-22 MED ORDER — SODIUM CHLORIDE 0.9 % IV BOLUS
500.0000 mL | Freq: Once | INTRAVENOUS | Status: AC
  Administered 2020-06-22: 500 mL via INTRAVENOUS

## 2020-06-22 MED ORDER — SODIUM CHLORIDE 0.9 % IV SOLN
1.0000 g | Freq: Once | INTRAVENOUS | Status: AC
  Administered 2020-06-22: 1 g via INTRAVENOUS
  Filled 2020-06-22: qty 10

## 2020-06-22 MED ORDER — SODIUM CHLORIDE 0.9 % IV SOLN
500.0000 mg | INTRAVENOUS | Status: DC
  Administered 2020-06-23 – 2020-06-25 (×3): 500 mg via INTRAVENOUS
  Filled 2020-06-22 (×4): qty 500

## 2020-06-22 MED ORDER — POTASSIUM CHLORIDE 20 MEQ PO PACK
40.0000 meq | PACK | Freq: Once | ORAL | Status: AC
  Administered 2020-06-22: 40 meq via ORAL
  Filled 2020-06-22: qty 2

## 2020-06-22 MED ORDER — LABETALOL HCL 5 MG/ML IV SOLN
5.0000 mg | INTRAVENOUS | Status: DC | PRN
  Filled 2020-06-22: qty 4

## 2020-06-22 MED ORDER — KCL IN DEXTROSE-NACL 20-5-0.9 MEQ/L-%-% IV SOLN
INTRAVENOUS | Status: AC
  Filled 2020-06-22 (×3): qty 1000

## 2020-06-22 MED ORDER — PANTOPRAZOLE SODIUM 40 MG IV SOLR
40.0000 mg | Freq: Two times a day (BID) | INTRAVENOUS | Status: DC
  Administered 2020-06-23 – 2020-06-27 (×9): 40 mg via INTRAVENOUS
  Filled 2020-06-22 (×9): qty 40

## 2020-06-22 NOTE — H&P (Addendum)
History and Physical    Natalie Spencer TDD:[id number] DOB: [birth year] DOA: 06/22/2020  PCP: Patient, No Pcp Per (Inactive)  Patient coming from: Skilled nursing facility.  Most of the history was obtained from ER physician and previous records as patient has dementia.  Chief Complaint: Nausea vomiting.  HPI: Natalie Spencer is a 87 y.o. female with history of advanced dementia was brought to the ER after patient had nausea vomiting.  Its not sure how long patient has been having vomiting.  Patient was admitted about 2 months ago for similar situation at that time EGD showed esophagitis.  ED Course: In the ER patient had further 4-5 episodes of vomiting and CT abdomen pelvis done shows features concerning for mixed intra-axial gastric volvulus with distention of the stomach and distal esophagus concerning for obstruction.  Patient also was found to have pneumonia on the CAT scan.  Labs show hypokalemia and acute renal failure.  Patient was started on fluids antibiotics admitted for further management.  COVID test was negative.  Review of Systems: As per HPI, rest all negative.   Past Medical History:  Diagnosis Date   Ambulatory dysfunction    Hypertension    Muscle weakness (generalized)    Senile dementia (HCC)    Stroke [facility])     Past Surgical History:  Procedure Laterality Date   CATARACT EXTRACTION, BILATERAL     ESOPHAGOGASTRODUODENOSCOPY (EGD) WITH PROPOFOL N/A 05/10/2020   Procedure: ESOPHAGOGASTRODUODENOSCOPY (EGD) WITH PROPOFOL;  Surgeon: Hilarie Fredrickson, MD;  Location: [facility] ENDOSCOPY;  Service: Endoscopy;  Laterality: N/A;   LAPAROSCOPIC CHOLECYSTECTOMY     RUPTURED GLOBE EXPLORATION AND REPAIR Left 02/09/2018   Procedure: REPAIR OF RUPTURED GLOBE LEFT EYE;  Surgeon: Carmela Rima, MD;  Location: [facility] OR;  Service: Ophthalmology;  Laterality: Left;     reports that she has never smoked. She has never used smokeless tobacco. She reports previous alcohol use. She reports that she  does not use drugs.  No Known Allergies  History reviewed. No pertinent family history.  Prior to Admission medications   Medication Sig Start Date End Date Taking? Authorizing Provider  amLODipine (NORVASC) 5 MG tablet Take 5 mg by mouth daily.   Yes [provider]  melatonin 5 MG TABS Take 5 mg by mouth at bedtime.   Yes [provider]  ondansetron (ZOFRAN ODT) 4 MG disintegrating tablet Take 1 tablet (4 mg total) by mouth every 8 (eight) hours as needed for nausea or vomiting. 05/08/20  Yes Alvira Monday, MD  pantoprazole (PROTONIX) 40 MG tablet Take 1 tablet (40 mg total) by mouth 2 (two) times daily. 05/11/20  Yes Lanae Boast, MD  traMADol (ULTRAM) 50 MG tablet Take 1 tablet (50 mg total) by mouth every 6 (six) hours as needed for moderate pain. Patient taking differently: Take 50 mg by mouth every 6 (six) hours as needed (for pain). 02/15/18  Yes Barnetta Chapel, PA-C  LORazepam (ATIVAN) 0.5 MG tablet Take 0.25 mg by mouth every 6 (six) hours as needed for anxiety (or agitation).    [provider]    Physical Exam: Constitutional: Moderately built and nourished. Vitals:   06/22/20 1509 06/22/20 1625 06/22/20 2128 06/22/20 2215  BP: (!) 154/74 (!) 159/98 (!) 164/90 (!) 167/84  Pulse: (!) 111 (!) 110 (!) 122 (!) 125  Resp: 16 16 (!) 21 (!) 24  Temp:      TempSrc:      SpO2: 96% 97% (!) 86% 92%   Eyes: Anicteric no  pallor. ENMT: No discharge from the ears eyes nose or mouth. Neck: No mass felt.  No neck rigidity. Respiratory: No rhonchi or crepitations. Cardiovascular: S1-S2 heard. Abdomen: Soft nontender bowel sound present. Musculoskeletal: No edema. Skin: No rash. Neurologic: Patient is lethargic but arousable. Psychiatric: Lethargic.   Labs on Admission: I have personally reviewed following labs and imaging studies  CBC: Recent Labs  Lab 06/22/20 1506  WBC 13.8*  HGB 14.7  HCT 46.2*  MCV 88.5  PLT 413*   Basic Metabolic  Panel: Recent Labs  Lab 06/22/20 1506  NA 143  K 2.9*  CL 101  CO2 26  GLUCOSE 145*  BUN 33*  CREATININE 1.03*  CALCIUM 10.2   GFR: CrCl cannot be calculated (Unknown ideal weight.). Liver Function Tests: Recent Labs  Lab 06/22/20 1506  AST 21  ALT 11  ALKPHOS 69  BILITOT 2.0*  PROT 8.1  ALBUMIN 3.8   No results for input(s): LIPASE, AMYLASE in the last 168 hours. No results for input(s): AMMONIA in the last 168 hours. Coagulation Profile: No results for input(s): INR, PROTIME in the last 168 hours. Cardiac Enzymes: No results for input(s): CKTOTAL, CKMB, CKMBINDEX, TROPONINI in the last 168 hours. BNP (last 3 results) No results for input(s): PROBNP in the last 8760 hours. HbA1C: No results for input(s): HGBA1C in the last 72 hours. CBG: No results for input(s): GLUCAP in the last 168 hours. Lipid Profile: No results for input(s): CHOL, HDL, LDLCALC, TRIG, CHOLHDL, LDLDIRECT in the last 72 hours. Thyroid Function Tests: No results for input(s): TSH, T4TOTAL, FREET4, T3FREE, THYROIDAB in the last 72 hours. Anemia Panel: No results for input(s): VITAMINB12, FOLATE, FERRITIN, TIBC, IRON, RETICCTPCT in the last 72 hours. Urine analysis:    Component Value Date/Time   COLORURINE YELLOW 05/08/2020 1955   APPEARANCEUR CLEAR 05/08/2020 1955   LABSPEC >1.046 (H) 05/08/2020 1955   PHURINE 7.0 05/08/2020 1955   GLUCOSEU NEGATIVE 05/08/2020 1955   HGBUR NEGATIVE 05/08/2020 1955   BILIRUBINUR NEGATIVE 05/08/2020 1955   KETONESUR 5 (A) 05/08/2020 1955   PROTEINUR NEGATIVE 05/08/2020 1955   NITRITE NEGATIVE 05/08/2020 1955   LEUKOCYTESUR NEGATIVE 05/08/2020 1955   Sepsis Labs: @LABRCNTIP (procalcitonin:4,lacticidven:4) )No results found for this or any previous visit (from the past 240 hour(s)).   Radiological Exams on Admission: CT Abdomen Pelvis Wo Contrast  Result Date: 06/22/2020 CLINICAL DATA:  Abdominal pain and vomiting. EXAM: CT ABDOMEN AND PELVIS WITHOUT  CONTRAST TECHNIQUE: Multidetector CT imaging of the abdomen and pelvis was performed following the standard protocol without IV contrast. COMPARISON:  CT abdomen pelvis dated May 09, 2020. FINDINGS: Lower chest: New consolidation in the left lower lobe, incompletely visualized. Hepatobiliary: No focal liver abnormality is seen. Status post cholecystectomy. No biliary dilatation. Pancreas: Atrophic. No ductal dilatation or surrounding inflammatory changes. Spleen: Normal in size without focal abnormality. Adrenals/Urinary Tract: Adrenal glands are unremarkable. No renal calculi or hydronephrosis. The bladder is unremarkable. 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 The stomach and lower esophagus are distended with fluid. No wall thickening or pneumatosis. No small bowel obstruction. Extensive sigmoid colonic diverticulosis again noted. Normal appendix. Vascular/Lymphatic: Aortic atherosclerosis. No enlarged abdominal or pelvic lymph nodes. Reproductive: Uterus and bilateral adnexa are unremarkable. Other: Unchanged tiny fat containing umbilical hernia. No free fluid or pneumoperitoneum. Musculoskeletal: No acute or significant osseous findings. IMPRESSION: 1. 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 The stomach and lower esophagus are  distended with fluid, suggestive of low-grade obstruction. This is unchanged in appearance from the prior scan. 2. New consolidation in the left lower lobe, incompletely visualized, concerning for pneumonia. 3. Aortic Atherosclerosis (ICD10-I70.0). Electronically Signed   By: Obie Dredge M.D.   On: 06/22/2020 21:15   DG Abd Acute W/Chest  Result Date: 06/22/2020 CLINICAL DATA:  Vomiting today. EXAM: DG ABDOMEN ACUTE WITH 1 VIEW CHEST COMPARISON:  CT abdomen pelvis dated May 09, 2020. Chest x-ray dated February 08, 2019. FINDINGS: There  is no evidence of dilated bowel loops or free intraperitoneal air. Paucity of small bowel gas. Air and stool are seen in the colon. Unchanged paraesophageal hiatal hernia. No radiopaque calculi or other significant radiographic abnormality is seen. Normal heart size. New patchy opacity in the peripheral left mid lung. Possible small left pleural effusion. The right lung is clear. No pneumothorax. No acute osseous abnormality. IMPRESSION: 1. New patchy opacity in the peripheral left mid lung, suspicious for pneumonia. 2. Possible small left pleural effusion. 3. Unchanged paraesophageal hiatal hernia.  No bowel obstruction. Electronically Signed   By: Obie Dredge M.D.   On: 06/22/2020 19:26    Assessment/Plan Principal Problem:   Nausea & vomiting Active Problems:   Dementia (HCC)   Hypertension   CAP (community acquired pneumonia)   ARF (acute renal failure) (HCC)    Nausea vomiting with CT scan showing mesenteric axial volvulus of the stomach with possible obstruction for which we will keep patient n.p.o. IV fluids consult general surgery/GI. Possible pneumonia for which patient is on empiric antibiotics. Hypokalemia likely from vomiting which we will replace and recheck. History of hypertension we will keep patient on as needed IV labetalol. History of dementia. Acute renal failure from vomiting hydrate and recheck metabolic panel. Possible GI bleed per we will check serial CBCs.  On IV Protonix.   Since patient has possible obstruction of the bowels will need close monitoring for any further worsening in inpatient status.  Addendum -discussed with Dr. Rhea Belton gastroenterologist who at this time recommended getting NG tube placed and they will be seeing patient in consult based on the response they will plan EGD.  Will need nonurgent general surgery consult also.   Addendum -I spoke to patient's legal guardian Ms. Eliot Ford over this time wants to do what can be done including  EGD if required but if patient's condition worsens then may move towards comfort measures but will need to be discussed with her before doing that.   DVT prophylaxis: SCDs.  Avoiding anticoagulation in the setting of possible bleed. Code Status: DNR. Family Communication: Discussed with patient's legal guardian Ms. Kallie Edward. Disposition Plan: Back to facility when stable. Consults called: We will try to consult general surgery/GI. Admission status: Inpatient.   Eduard Clos MD [facility] Pager [phone].  If 7PM-7AM, please contact night-coverage [email or web address] Password [facility]  06/22/2020, 11:09 PM

## 2020-06-22 NOTE — Progress Notes (Signed)
Contacted by Dr. Toniann Fail. Pt presenting with n/v and found to have PNA.  Hospitalization in April 2022 for similar symptoms when CT imaging showed paraesophageal hernia. EGD in April performed by Dr. Marina Goodell revealed esophagitis.  Now CT scan shows similar paraesophageal hernia and mesenteroaxial gastric volvulus.  This is likely a secondary gastric volvulus due to the anatomic abnormality = paraesophageal hernia.  This is also likely mostly chronic in nature given it appears to < 180 degree gastric rotation (partial gastric outlet obstruction).  Recommendation/Plan: Resuscitation with IVFs  ABX in place given PNA Gastric decompression with NG tube placement now Formal GI consult to follow Surgical consultation  If NGT placement fails, endoscopic NGT placement can be performed.  Please contact GI if NGT placement unsuccessful

## 2020-06-22 NOTE — ED Notes (Signed)
Pt had approximately a mouthful of vomitus she had spit up. MD notifiied

## 2020-06-22 NOTE — ED Triage Notes (Signed)
[facility] here [facility] brought over to the ED today due to one episode of vomiting after eating some orange jello

## 2020-06-22 NOTE — ED Provider Notes (Addendum)
[facility] EMERGENCY DEPARTMENT Provider Note   CSN: [id number] Arrival date & time: 06/22/20  1350     History Chief Complaint  Patient presents with   Emesis    Natalie Spencer is a 87 y.o. female.  Patient from ECF with nausea and vomiting. Symptoms acute onset earlier today, episodic, recurrent. Pt is limited historian, dementia, level 5 caveat. ?abd pain earlier, ?better currently Is having normal bms. No abd distension. No trouble swallowing or esophageal fb sensation. No nosebleeding. No anticoag use. No fever or chills. No known ill contacts or bad food ingestion.   The history is provided by the patient, the nursing home and the EMS personnel. The history is limited by the condition of the patient.      Past Medical History:  Diagnosis Date   Ambulatory dysfunction    Hypertension    Muscle weakness (generalized)    Senile dementia (HCC)    Stroke [facility])     Patient Active Problem List   Diagnosis Date Noted   Hematemesis 05/10/2020   Gastroesophageal reflux disease with esophagitis without hemorrhage    Paraesophageal hernia    Acute upper GI bleed 05/09/2020   Nausea & vomiting 05/09/2020   Lactic acidosis 05/09/2020   Hypertension    Leukocytosis    Hypokalemia    TBI (traumatic brain injury) (HCC) 02/08/2018   Injury of globe of left eye 02/08/2018   Contusion of right middle finger 02/08/2018   Fall 02/08/2018   Dementia (HCC) 02/08/2018   Anemia 02/08/2018   Bilateral lower extremity edema 02/08/2018    Past Surgical History:  Procedure Laterality Date   CATARACT EXTRACTION, BILATERAL     ESOPHAGOGASTRODUODENOSCOPY (EGD) WITH PROPOFOL N/A 05/10/2020   Procedure: ESOPHAGOGASTRODUODENOSCOPY (EGD) WITH PROPOFOL;  Surgeon: Hilarie Fredrickson, MD;  Location: [facility] ENDOSCOPY;  Service: Endoscopy;  Laterality: N/A;   LAPAROSCOPIC CHOLECYSTECTOMY     RUPTURED GLOBE EXPLORATION AND REPAIR Left 02/09/2018   Procedure: REPAIR OF RUPTURED GLOBE LEFT  EYE;  Surgeon: Carmela Rima, MD;  Location: [facility] OR;  Service: Ophthalmology;  Laterality: Left;     OB History   No obstetric history on file.     History reviewed. No pertinent family history.  Social History   Tobacco Use   Smoking status: Never   Smokeless tobacco: Never  Substance Use Topics   Alcohol use: Not Currently   Drug use: Never    Home Medications Prior to Admission medications   Medication Sig Start Date End Date Taking? Authorizing Provider  amLODipine (NORVASC) 5 MG tablet Take 5 mg by mouth daily.   Yes [provider]  melatonin 5 MG TABS Take 5 mg by mouth at bedtime.   Yes [provider]  ondansetron (ZOFRAN ODT) 4 MG disintegrating tablet Take 1 tablet (4 mg total) by mouth every 8 (eight) hours as needed for nausea or vomiting. 05/08/20  Yes Alvira Monday, MD  pantoprazole (PROTONIX) 40 MG tablet Take 1 tablet (40 mg total) by mouth 2 (two) times daily. 05/11/20  Yes Lanae Boast, MD  traMADol (ULTRAM) 50 MG tablet Take 1 tablet (50 mg total) by mouth every 6 (six) hours as needed for moderate pain. Patient taking differently: Take 50 mg by mouth every 6 (six) hours as needed (for pain). 02/15/18  Yes Barnetta Chapel, PA-C  LORazepam (ATIVAN) 0.5 MG tablet Take 0.25 mg by mouth every 6 (six) hours as needed for anxiety (or agitation).    [provider]  Allergies    Patient has no known allergies.  Review of Systems   Review of Systems  Constitutional:  Negative for fever.  HENT:  Negative for sore throat.   Eyes:  Negative for redness.  Respiratory:  Negative for cough and shortness of breath.   Cardiovascular:  Negative for chest pain.  Gastrointestinal:  Positive for abdominal pain, nausea and vomiting.  Genitourinary:  Negative for dysuria and flank pain.  Musculoskeletal:  Negative for back pain and neck pain.  Skin:  Negative for rash.  Neurological:  Negative for headaches.  Hematological:  Does not bruise/bleed  easily.  Psychiatric/Behavioral:  Negative for confusion.    Physical Exam Updated Vital Signs BP (!) 159/98   Pulse (!) 110   Temp 98.5 F (36.9 C) (Oral)   Resp 16   LMP  (LMP Unknown)   SpO2 97%   Physical Exam Vitals and nursing note reviewed.  Constitutional:      Appearance: Normal appearance. She is well-developed.  HENT:     Head: Atraumatic.     Nose: Nose normal.     Mouth/Throat:     Mouth: Mucous membranes are moist.     Pharynx: Oropharynx is clear. No oropharyngeal exudate or posterior oropharyngeal erythema.  Eyes:     General: No scleral icterus.    Conjunctiva/sclera: Conjunctivae normal.     Pupils: Pupils are equal, round, and reactive to light.  Neck:     Trachea: No tracheal deviation.  Cardiovascular:     Rate and Rhythm: Normal rate and regular rhythm.     Pulses: Normal pulses.     Heart sounds: Normal heart sounds. No murmur heard.   No friction rub. No gallop.  Pulmonary:     Effort: Pulmonary effort is normal. No respiratory distress.     Breath sounds: Normal breath sounds.  Abdominal:     General: Bowel sounds are normal. There is no distension.     Palpations: Abdomen is soft. There is no mass.     Tenderness: There is abdominal tenderness. There is no guarding or rebound.     Hernia: No hernia is present.     Comments: Mild mid to upper abd tenderness.   Genitourinary:    Comments: No cva tenderness. Light brown stool, sent for hemoccult  Musculoskeletal:        General: No swelling.     Cervical back: Normal range of motion and neck supple. No rigidity. No muscular tenderness.  Skin:    General: Skin is warm and dry.     Findings: No rash.  Neurological:     Mental Status: She is alert.     Comments: Alert, speech normal. Motor/sens grossly intact.   Psychiatric:        Mood and Affect: Mood normal.    ED Results / Procedures / Treatments   Labs (all labs ordered are listed, but only abnormal results are displayed) Results  for orders placed or performed during the hospital encounter of 06/22/20  Comprehensive metabolic panel  Result Value Ref Range   Sodium 143 135 - 145 mmol/L   Potassium 2.9 (L) 3.5 - 5.1 mmol/L   Chloride 101 98 - 111 mmol/L   CO2 26 22 - 32 mmol/L   Glucose, Bld 145 (H) 70 - 99 mg/dL   BUN 33 (H) 8 - 23 mg/dL   Creatinine, Ser 0.101.03 (H) 0.44 - 1.00 mg/dL   Calcium 27.210.2 8.9 - 53.610.3 mg/dL   Total Protein 8.1  6.5 - 8.1 g/dL   Albumin 3.8 3.5 - 5.0 g/dL   AST 21 15 - 41 U/L   ALT 11 0 - 44 U/L   Alkaline Phosphatase 69 38 - 126 U/L   Total Bilirubin 2.0 (H) 0.3 - 1.2 mg/dL   GFR, Estimated 51 (L) >60 mL/min   Anion gap 16 (H) 5 - 15  CBC  Result Value Ref Range   WBC 13.8 (H) 4.0 - 10.5 K/uL   RBC 5.22 (H) 3.87 - 5.11 MIL/uL   Hemoglobin 14.7 12.0 - 15.0 g/dL   HCT 67.6 (H) 19.5 - 09.3 %   MCV 88.5 80.0 - 100.0 fL   MCH 28.2 26.0 - 34.0 pg   MCHC 31.8 30.0 - 36.0 g/dL   RDW 26.7 12.4 - 58.0 %   Platelets 413 (H) 150 - 400 K/uL   nRBC 0.0 0.0 - 0.2 %  POC occult blood, ED Provider will collect  Result Value Ref Range   Fecal Occult Bld POSITIVE (A) NEGATIVE     EKG None  Radiology CT Abdomen Pelvis Wo Contrast  Result Date: 06/22/2020 CLINICAL DATA:  Abdominal pain and vomiting. EXAM: CT ABDOMEN AND PELVIS WITHOUT CONTRAST TECHNIQUE: Multidetector CT imaging of the abdomen and pelvis was performed following the standard protocol without IV contrast. COMPARISON:  CT abdomen pelvis dated May 09, 2020. FINDINGS: Lower chest: New consolidation in the left lower lobe, incompletely visualized. Hepatobiliary: No focal liver abnormality is seen. Status post cholecystectomy. No biliary dilatation. Pancreas: Atrophic. No ductal dilatation or surrounding inflammatory changes. Spleen: Normal in size without focal abnormality. Adrenals/Urinary Tract: Adrenal glands are unremarkable. No renal calculi or hydronephrosis. The bladder is unremarkable. Stomach/Bowel: Unchanged mesenteroaxial  gastric volvulus with paraesophageal herniation of part of the distal stomach. The stomach and lower esophagus are distended with fluid. No wall thickening or pneumatosis. No small bowel obstruction. Extensive sigmoid colonic diverticulosis again noted. Normal appendix. Vascular/Lymphatic: Aortic atherosclerosis. No enlarged abdominal or pelvic lymph nodes. Reproductive: Uterus and bilateral adnexa are unremarkable. Other: Unchanged tiny fat containing umbilical hernia. No free fluid or pneumoperitoneum. Musculoskeletal: No acute or significant osseous findings. IMPRESSION: 1. 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 The stomach and lower esophagus are distended with fluid, suggestive of low-grade obstruction. This is unchanged in appearance from the prior scan. 2. New consolidation in the left lower lobe, incompletely visualized, concerning for pneumonia. 3. Aortic Atherosclerosis (ICD10-I70.0). Electronically Signed   By: Obie Dredge M.D.   On: 06/22/2020 21:15   DG Abd Acute W/Chest  Result Date: 06/22/2020 CLINICAL DATA:  Vomiting today. EXAM: DG ABDOMEN ACUTE WITH 1 VIEW CHEST COMPARISON:  CT abdomen pelvis dated May 09, 2020. Chest x-ray dated February 08, 2019. FINDINGS: There is no evidence of dilated bowel loops or free intraperitoneal air. Paucity of small bowel gas. Air and stool are seen in the colon. Unchanged paraesophageal hiatal hernia. No radiopaque calculi or other significant radiographic abnormality is seen. Normal heart size. New patchy opacity in the peripheral left mid lung. Possible small left pleural effusion. The right lung is clear. No pneumothorax. No acute osseous abnormality. IMPRESSION: 1. New patchy opacity in the peripheral left mid lung, suspicious for pneumonia. 2. Possible small left pleural effusion. 3. Unchanged paraesophageal hiatal hernia.  No bowel obstruction. Electronically Signed   By: Obie Dredge  M.D.   On: 06/22/2020 19:26    Procedures Procedures   Medications Ordered in ED Medications  potassium chloride (  KLOR-CON) packet 40 mEq (has no administration in time range)  azithromycin (ZITHROMAX) 500 mg in sodium chloride 0.9 % 250 mL IVPB (has no administration in time range)  cefTRIAXone (ROCEPHIN) 1 g in sodium chloride 0.9 % 100 mL IVPB (has no administration in time range)  ondansetron (ZOFRAN-ODT) disintegrating tablet 8 mg (8 mg Oral Given 06/22/20 1705)  sodium chloride 0.9 % bolus 500 mL (500 mLs Intravenous New Bag/Given 06/22/20 1744)    ED Course  I have reviewed the triage vital signs and the nursing notes.  Pertinent labs & imaging results that were available during my care of the patient were reviewed by me and considered in my medical decision making (see chart for details).    MDM Rules/Calculators/A&P                         Iv ns. Continuous pulse ox and cardiac monitoring. Stat labs.   Reviewed nursing notes and prior charts for additional history. Patient with prior workup for nv/pain in 4/22, with EGD w mild esophagitis, but no significant acute process, and ct x 2 neg for acute process.   Labs reviewed/interpreted by me - hemoccult is noted to be '+', however patients stool is very light brown - no gross blood or melena.   Zofran po. Po fluids. Patient with recurrent vomiting, not able to tolerate po. Will get additional imaging.   Xrays reviewed/interpreted by me - no sbo.   CT reviewed/interpreted by me - ?gastric volv/paraeso hernia - noted to be unchanged from prior.  ?intermittent obstruction due to paraesop hernia.   Given recurrent nv, unable to tolerate po, will admit. Given possible pna on cxr, will cover w abx.   K low in ED, kcl.   Medicine consulted for admission. Discussed pt with Dr Toniann Fail - will admit.   He also requests we consult general surgery as relates ct reading - will consult w general surgery.   Discussed with Dr Dwain Sarna  - he indicates given grand age, dementia, prior symptoms that improved w conservative tx, etc that recommends medicine admission, ivf, conservative tx for now - he will stop by and see patient.          Final Clinical Impression(s) / ED Diagnoses Final diagnoses:  None    Rx / DC Orders ED Discharge Orders     None           Cathren Laine, MD 06/22/20 2312

## 2020-06-23 ENCOUNTER — Encounter (HOSPITAL_COMMUNITY)
Admission: EM | Disposition: A | Discharge status: Skilled Nursing Facility | Payer: Self-pay | Source: Home / Self Care | Attending: Internal Medicine

## 2020-06-23 ENCOUNTER — Encounter (HOSPITAL_COMMUNITY): Payer: Self-pay | Admitting: Internal Medicine

## 2020-06-23 ENCOUNTER — Inpatient Hospital Stay (HOSPITAL_COMMUNITY): Payer: Medicare Other | Admitting: Anesthesiology

## 2020-06-23 DIAGNOSIS — K3189 Other diseases of stomach and duodenum: Secondary | ICD-10-CM

## 2020-06-23 DIAGNOSIS — R933 Abnormal findings on diagnostic imaging of other parts of digestive tract: Secondary | ICD-10-CM

## 2020-06-23 DIAGNOSIS — K2970 Gastritis, unspecified, without bleeding: Secondary | ICD-10-CM

## 2020-06-23 DIAGNOSIS — K2210 Ulcer of esophagus without bleeding: Secondary | ICD-10-CM

## 2020-06-23 DIAGNOSIS — Z7189 Other specified counseling: Secondary | ICD-10-CM

## 2020-06-23 DIAGNOSIS — K449 Diaphragmatic hernia without obstruction or gangrene: Secondary | ICD-10-CM

## 2020-06-23 DIAGNOSIS — Z515 Encounter for palliative care: Secondary | ICD-10-CM

## 2020-06-23 HISTORY — PX: ESOPHAGOGASTRODUODENOSCOPY (EGD) WITH PROPOFOL: SHX5813

## 2020-06-23 HISTORY — PX: BOWEL DECOMPRESSION: SHX5532

## 2020-06-23 LAB — CBC
HCT: 51 % — ABNORMAL HIGH (ref 36.0–46.0)
Hemoglobin: 16.1 g/dL — ABNORMAL HIGH (ref 12.0–15.0)
MCH: 28 pg (ref 26.0–34.0)
MCHC: 31.6 g/dL (ref 30.0–36.0)
MCV: 88.5 fL (ref 80.0–100.0)
Platelets: 371 10*3/uL (ref 150–400)
RBC: 5.76 MIL/uL — ABNORMAL HIGH (ref 3.87–5.11)
RDW: 14.1 % (ref 11.5–15.5)
WBC: 11.2 10*3/uL — ABNORMAL HIGH (ref 4.0–10.5)
nRBC: 0 % (ref 0.0–0.2)

## 2020-06-23 LAB — BASIC METABOLIC PANEL
Anion gap: 17 — ABNORMAL HIGH (ref 5–15)
BUN: 33 mg/dL — ABNORMAL HIGH (ref 8–23)
CO2: 25 mmol/L (ref 22–32)
Calcium: 10.3 mg/dL (ref 8.9–10.3)
Chloride: 104 mmol/L (ref 98–111)
Creatinine, Ser: 1.03 mg/dL — ABNORMAL HIGH (ref 0.44–1.00)
GFR, Estimated: 51 mL/min — ABNORMAL LOW (ref >60)
Glucose, Bld: 181 mg/dL — ABNORMAL HIGH (ref 70–99)
Potassium: 3.2 mmol/L — ABNORMAL LOW (ref 3.5–5.1)
Sodium: 146 mmol/L — ABNORMAL HIGH (ref 135–145)

## 2020-06-23 LAB — RESP PANEL BY RT-PCR (FLU A&B, COVID) ARPGX2
Influenza A by PCR: NEGATIVE
Influenza B by PCR: NEGATIVE
SARS Coronavirus 2 by RT PCR: NEGATIVE

## 2020-06-23 LAB — GLUCOSE, CAPILLARY
Glucose-Capillary: 131 mg/dL — ABNORMAL HIGH (ref 70–99)
Glucose-Capillary: 146 mg/dL — ABNORMAL HIGH (ref 70–99)
Glucose-Capillary: 185 mg/dL — ABNORMAL HIGH (ref 70–99)

## 2020-06-23 SURGERY — ESOPHAGOGASTRODUODENOSCOPY (EGD) WITH PROPOFOL
Anesthesia: General

## 2020-06-23 MED ORDER — ONDANSETRON HCL 4 MG/2ML IJ SOLN
INTRAMUSCULAR | Status: DC | PRN
  Administered 2020-06-23: 4 mg via INTRAVENOUS

## 2020-06-23 MED ORDER — PROPOFOL 10 MG/ML IV BOLUS
INTRAVENOUS | Status: DC | PRN
  Administered 2020-06-23: 70 mg via INTRAVENOUS

## 2020-06-23 MED ORDER — FENTANYL CITRATE (PF) 100 MCG/2ML IJ SOLN: 25.0000 ug | INTRAMUSCULAR | Status: DC | PRN

## 2020-06-23 MED ORDER — LACTATED RINGERS IV SOLN: INTRAVENOUS | Status: DC | PRN

## 2020-06-23 MED ORDER — CLONIDINE HCL 0.2 MG/24HR TD PTWK
0.2000 mg | MEDICATED_PATCH | TRANSDERMAL | Status: DC
  Administered 2020-06-23: 0.2 mg via TRANSDERMAL
  Filled 2020-06-23: qty 1

## 2020-06-23 MED ORDER — LABETALOL HCL 5 MG/ML IV SOLN
5.0000 mg | INTRAVENOUS | Status: DC | PRN
  Administered 2020-06-23: 5 mg via INTRAVENOUS

## 2020-06-23 MED ORDER — PHENYLEPHRINE HCL (PRESSORS) 10 MG/ML IV SOLN
INTRAVENOUS | Status: DC | PRN
  Administered 2020-06-23 (×2): 100 ug via INTRAVENOUS

## 2020-06-23 MED ORDER — LABETALOL HCL 5 MG/ML IV SOLN
INTRAVENOUS | Status: AC
  Filled 2020-06-23: qty 4

## 2020-06-23 MED ORDER — SUCCINYLCHOLINE CHLORIDE 20 MG/ML IJ SOLN
INTRAMUSCULAR | Status: DC | PRN
  Administered 2020-06-23: 100 mg via INTRAVENOUS

## 2020-06-23 MED ORDER — MORPHINE SULFATE (PF) 2 MG/ML IV SOLN
1.0000 mg | INTRAVENOUS | Status: DC | PRN
  Administered 2020-06-23 – 2020-06-26 (×4): 1 mg via INTRAVENOUS
  Filled 2020-06-23 (×4): qty 1

## 2020-06-23 MED ORDER — LIDOCAINE 2% (20 MG/ML) 5 ML SYRINGE
INTRAMUSCULAR | Status: DC | PRN
  Administered 2020-06-23: 30 mg via INTRAVENOUS

## 2020-06-23 SURGICAL SUPPLY — 15 items

## 2020-06-23 NOTE — Anesthesia Procedure Notes (Signed)
Procedure Name: Intubation Date/Time: 06/23/2020 1:05 PM Performed by: Eligha Bridegroom, CRNA Pre-anesthesia Checklist: Patient identified, Emergency Drugs available, Suction available, Patient being monitored and Timeout performed Patient Re-evaluated:Patient Re-evaluated prior to induction Oxygen Delivery Method: Circle system utilized Preoxygenation: Pre-oxygenation with 100% oxygen Induction Type: IV induction, Rapid sequence and Cricoid Pressure applied Laryngoscope Size: Mac and 3 Grade View: Grade II Tube type: Oral Tube size: 7.0 mm Number of attempts: 1 Airway Equipment and Method: Stylet Placement Confirmation: ETT inserted through vocal cords under direct vision and breath sounds checked- equal and bilateral Secured at: 21 cm Tube secured with: Tape Dental Injury: Teeth and Oropharynx as per pre-operative assessment

## 2020-06-23 NOTE — Transfer of Care (Signed)
Immediate Anesthesia Transfer of Care Note  Patient: Natalie Spencer  Procedure(s) Performed: ESOPHAGOGASTRODUODENOSCOPY (EGD) WITH PROPOFOL Gastric DECOMPRESSION  Patient Location: PACU  Anesthesia Type:General  Level of Consciousness: awake  Airway & Oxygen Therapy: Patient Spontanous Breathing and Patient connected to nasal cannula oxygen  Post-op Assessment: Report given to RN and Post -op Vital signs reviewed and stable  Post vital signs: Reviewed and stable  Last Vitals:  Vitals Value Taken Time  BP 182/99 06/23/20 1450  Temp 36.3 C 06/23/20 1450  Pulse 113 06/23/20 1450  Resp 17 06/23/20 1450  SpO2 94 % 06/23/20 1450    Last Pain:  Vitals:   06/23/20 1228  TempSrc: Oral         Complications: No notable events documented.

## 2020-06-23 NOTE — Anesthesia Preprocedure Evaluation (Addendum)
Anesthesia Evaluation  Patient identified by MRN, date of birth, ID band Patient awake    Reviewed: Allergy & Precautions, NPO status , Patient's Chart, lab work & pertinent test results, Unable to perform ROS - Chart review only  History of Anesthesia Complications Negative for: history of anesthetic complications  Airway Mallampati: II  TM Distance: >3 FB Neck ROM: Full    Dental  (+) Poor Dentition, Caps, Chipped, Loose, Dental Advisory Given   Pulmonary pneumonia (aspiration of gastric contents), unresolved,  06/23/2020 SARS coronavirus NEG   + rhonchi        Cardiovascular hypertension, Pt. on medications (-) angina Rhythm:Regular Rate:Tachycardia  '20 ECHO: normal wall thickness, EF >65%, impaired  relaxation pattern of diastolic filling, no significant valvular abnormalities   Neuro/Psych Dementia CVA    GI/Hepatic Neg liver ROS, GERD  Medicated and Poorly Controlled,Gastric volvulus: copious emesis   Endo/Other  negative endocrine ROS  Renal/GU Renal InsufficiencyRenal disease     Musculoskeletal   Abdominal   Peds  Hematology negative hematology ROS (+)   Anesthesia Other Findings   Reproductive/Obstetrics                            Anesthesia Physical Anesthesia Plan  ASA: 4  Anesthesia Plan: General   Post-op Pain Management:    Induction: Intravenous  PONV Risk Score and Plan: 3 and Ondansetron, Dexamethasone and Treatment may vary due to age or medical condition  Airway Management Planned: Oral ETT  Additional Equipment: None  Intra-op Plan:   Post-operative Plan: Possible Post-op intubation/ventilation  Informed Consent: I have reviewed the patients History and Physical, chart, labs and discussed the procedure including the risks, benefits and alternatives for the proposed anesthesia with the patient or authorized representative who has indicated his/her  understanding and acceptance.   Patient has DNR.  Discussed DNR with power of attorney and Suspend DNR.   Consent reviewed with POA, Dental advisory given and History available from chart only  Plan Discussed with: CRNA and Surgeon  Anesthesia Plan Comments: (Discussed with patient"s guardianship, who agree to suspension of DNR for procedure, and wish patient to be extubated at end of procedure if at all possible, and subsequent re-instatement of DNR)       Anesthesia Quick Evaluation

## 2020-06-23 NOTE — Op Note (Addendum)
[facility] Patient Name: Natalie HansonHope Piacente Procedure Date : 06/23/2020 MRN: [id number] Attending MD: Meryl DareMalcolm T Garvey Westcott , MD Date of Birth: 09/21/1926 CSN: [id number] Age: [id number] Admit Type: Inpatient Procedure:                Upper GI endoscopy Indications:              Abnormal CT of the GI tract, Nausea with vomiting Providers:                Venita LickMalcolm T. Russella DarStark, MD, Roselie AwkwardShannon Love, RN, Rozetta NunneryAnthony                            Gillies, Technician [facility]erring [facility]             [facility] Medic[facility]:                General Anesthesia Complications:            No immediate complications. Estimated Blood Loss:     Estimated blood loss: none. Procedure:                Pre-Anesthesia Assessment:                           - Prior to the procedure, a History and Physical                            was performed, and patient medications and                            allergies were reviewed. The patient's tolerance of                            previous anesthesia was also reviewed. The risks                            and benefits of the procedure and the sedation                            options and risks were discussed with the patient.                            All questions were answered, and informed consent                            was obtained. Prior Anticoagulants: The patient has                            taken no previous anticoagulant or antiplatelet                            agents. ASA Grade Assessment: III - A patient with                            severe systemic disease. After reviewing the risks  and benefits, the patient was deemed in                            satisfactory condition to undergo the procedure.                           After obtaining informed consent, the endoscope was                            passed under direct vision. Throughout the                            procedure, the patient's blood pressure, pulse, and                             oxygen saturations were monitored continuously. The                            [id number]-H190 ([id number]) Olympus gastroscope was                            introduced through the mouth, and advanced to the                            antrum of the stomach. The upper GI endoscopy was                            technically difficult and complex due to abnormal                            anatomy. The patient tolerated the procedure well. Scope In: Scope Out: Findings:      The lumen of the esophagus was moderately dilated, fluid filled and was       suctioned.      Diffuse moderate inflammation characterized by erythema, granularity and       linear erosions was found in the mid esophagus and in the distal       esophagus.      The exam of the esophagus was otherwise normal.      A large paraesophageal hiatal hernia was present involving the distal       stomach.      Localized moderate inflammation characterized by erythema, friability       and granularity was found in the gastric fundus and in the gastric body.      Gastric volvulus was present. Despite multiple attempts with the altered       anatomy I was unable to locate the distal gastric lumen to advance to       the pylorus and duodenum.      - Large volume of gastric fluid was suctioned. NGT was placed under       endoscopic guidance into the gastric body. Impression:               - Dilation in the entire esophagus.                           - Esophageal  mucosal changes were present,                            including erythema, granularity and linear                            erosions. Findings are suggestive of inflammation.                           - Large paraesophageal hiatal hernia involving the                            distal stomach.                           - Gastritis.                           - Gastric volvulus, distal stomach in the                            paraesophgeal hernia. With the altered anatomy I                             was unable to locate the pylorus and duodenum.                           - No specimens collected. Recommendation:           - Return patient to hospital ward for ongoing care.                           - NPO today.                           - Continue present medications.                           - NG tube to LIS. Procedure Code(s):        --- Professional ---                           [phone], 52, Esophagogastroduodenoscopy, flexible,                            transoral; diagnostic, including collection of                            specimen(s) by brushing or washing, when performed                            (separate procedure) Diagnosis Code(s):        --- Professional ---                           K22.8, Other specified diseases of esophagus  K22.10, Ulcer of esophagus without bleeding                           K44.9, Diaphragmatic hernia without obstruction or                            gangrene                           K29.70, Gastritis, unspecified, without bleeding                           K31.89, Other diseases of stomach and duodenum                           R11.2, Nausea with vomiting, unspecified                           R93.3, Abnormal findings on diagnostic imaging of                            other parts of digestive tract CPT copyright 2019 [employer]. All rights reserved. The codes documented in this report are preliminary and upon coder review may  be revised to meet current compliance requirements. Meryl Dare, MD 06/23/2020 2:19:17 PM This report has been signed electronically. Number of Addenda: 0

## 2020-06-23 NOTE — Progress Notes (Signed)
NGT was attempted per Dr Katherene Ponto instruction.  Attempted x2 with no success.  On the second attempt, the patient vomited mid attempt.  Pt was immediately suctioned and the attempt was stopped.  RT paged to assess for potential aspiration.  Pt saturating in high 80's to low 90's on 5L. The attendant at bedside was educated on the importance of leaving her head elevated, suction placed within reach. The current attendant will notify us if she starts to vomit again. DR Toniann Fail notified of current status.

## 2020-06-23 NOTE — H&P (View-Only) (Signed)
 Referring Provider:  [facility]         Primary Care Physician:  Patient, No Pcp Per (Inactive) Primary Gastroenterologist:    unassigned / Dr. Perry evaluated as inpatient in April 2022        We were asked to see this patient for:  nausea , vomiting, paraesophageal hernia, gastric volvulus.               ASSESSMENT / PLAN:   # 87 yo female with nausea / vomiting. CT scan remarkable for mesenteric axial volvulus of stomach with fluid distending the stomach and esophagus.  --Nursing staff unable to place NGT ( led to vomiting and concern for aspiration)  --Keep NPO. Will plan for EGD to be done this if I can get in touch with her legal guardian / POA (Natalie Spencer)  ADDENDUM: I was unable to reach Guardian Natalie Spencer. I left a VM for her to call me back. However [facility] just called me. He spoke to Natalie Spencer (POA ) and she is fine with proceeding with EGD. I tried to call her myself to discuss EGD but did not get an answer. Will Dr. Singh to consign consent form with Dr. Taylor Levick (we can proceed with consent of two physicians).   # Possible PNA, on antibiotics  # AKI, creatinine 1.03. GFR 51.   # FOBT positive stool. He is hemoconcentrated so hard to know what true hemoglobin is but not overt GI bleeding.  --Agree with BID IV PPI.   # Hypokalemia, replacement in progress, His K+ improved  2.9 >> 3.2  # HTN, BP improving with IV Labetalol.      Attending Physician Note   I have taken a history, examined the patient and reviewed the chart. I agree with the Advanced Practitioner's note, impression and recommendations.  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 Nursing staff unable to place NG tube for decompression.   Recommend EGD to further evaluate and to place NG tube. Attempting to obtain consent from her guardian / POA and if not available will proceed  with 2 physicians attesting to urgent need for EGD. PNA, AKI, hypokalemia, HTN, dementia mgmt per primary service.   Daleisa Halperin, MD FACG [phone]       HPI:                                                                                                                             Chief Complaint: nausea and vomiting.   Natalie Spencer is a 87 y.o. female with a past medical history significant for advanced dementia, HTN, CVA, paraesophageal hernia, cholecystectomy  We saw patient during April 2022 admission for hematemesis. EGD showed mild esophagitis and a small paraesophageal hernia. Patient back to ED yesterday from [facility] for nausea / vomiting.   *Patient is unable provide history. She is   confused  In ED patient was afebrile, hypertensive, and tachycardic. She was volume depleted and with AKI. She was hypokalemic. Her WBC was 13.8, hgb 17.7. Tbili 2.0, remainder of liver tests normal. FOBT+. Noncon CT scan without hepatobiliary findings. 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 Stomach and lower esophagus distended with fluid. New consolidation in LLL concerning for PNA. ED discussed case with [facility] - Dr. Dwain Sarna. Given age, dementia and prior symptoms improvement with conservative measures he recommended conservative treatment for now. Started on IVF, antibiotics and admitted for further management.   PREVIOUS ENDOSCOPIC EVALUATIONS / PERTINENT STUDIES   April 2022 EGD for hematemesis Mild esophagitis. Small paraesophageal hernia. Dilated stomach without obstruction or other abnormality. Otherwise normal EGD.  06/22/20 Noncon CT scan IMPRESSION: 1. 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 The stomach and lower esophagus are distended with fluid, suggestive of low-grade obstruction. This is unchanged in appearance from the prior scan. 2. New consolidation  in the left lower lobe, incompletely visualized, concerning for pneumonia. 3. Aortic Atherosclerosis (ICD10-I70.0   Past Medical History:  Diagnosis Date   Ambulatory dysfunction    Hypertension    Muscle weakness (generalized)    Senile dementia (HCC)    Stroke [facility])     Past Surgical History:  Procedure Laterality Date   CATARACT EXTRACTION, BILATERAL     ESOPHAGOGASTRODUODENOSCOPY (EGD) WITH PROPOFOL N/A 05/10/2020   Procedure: ESOPHAGOGASTRODUODENOSCOPY (EGD) WITH PROPOFOL;  Surgeon: Hilarie Fredrickson, MD;  Location[facility] ENDOSCOPY;  Service: Endoscopy;  Laterality: N/A;   LAPAROSCOPIC CHOLECYSTECTOMY     RUPTURED GLOBE EXPLORATION AND REPAIR Left 02/09/2018   Procedure: REPAIR OF RUPTURED GLOBE LEFT EYE;  Surgeon: Carmela Rima, MD;  Location: [facility] OR;  Service: Ophthalmology;  Laterality: Left;    Prior to Admission medications   Medication Sig Start Date End Date Taking? Authorizing Provider  amLODipine (NORVASC) 5 MG tablet Take 5 mg by mouth daily.   Yes [provider]  melatonin 5 MG TABS Take 5 mg by mouth at bedtime.   Yes [provider]  ondansetron (ZOFRAN ODT) 4 MG disintegrating tablet Take 1 tablet (4 mg total) by mouth every 8 (eight) hours as needed for nausea or vomiting. 05/08/20  Yes Alvira Monday, MD  pantoprazole (PROTONIX) 40 MG tablet Take 1 tablet (40 mg total) by mouth 2 (two) times daily. 05/11/20  Yes Lanae Boast, MD  traMADol (ULTRAM) 50 MG tablet Take 1 tablet (50 mg total) by mouth every 6 (six) hours as needed for moderate pain. Patient taking differently: Take 50 mg by mouth every 6 (six) hours as needed (for pain). 02/15/18  Yes Barnetta Chapel, PA-C  LORazepam (ATIVAN) 0.5 MG tablet Take 0.25 mg by mouth every 6 (six) hours as needed for anxiety (or agitation).    [provider]    Current Facility-Administered Medications  Medication Dose Route Frequency Provider Last Rate Last Admin   acetaminophen (TYLENOL) tablet 650 mg   650 mg Oral Q6H PRN Eduard Clos, MD       Or   acetaminophen (TYLENOL) suppository 650 mg  650 mg Rectal Q6H PRN Eduard Clos, MD       azithromycin (ZITHROMAX) 500 mg in sodium chloride 0.9 % 250 mL IVPB  500 mg Intravenous Q24H Eduard Clos, MD       cefTRIAXone (ROCEPHIN) 2 g in sodium chloride 0.9 % 100 mL IVPB  2 g Intravenous  Q24H Eduard ClosKakrakandy, Arshad N, MD       dextrose 5 % and 0.9 % NaCl with KCl 20 mEq/L infusion   Intravenous Continuous Eduard ClosKakrakandy, Arshad N, MD 75 mL/hr at 06/23/20 0054 New Bag at 06/23/20 0054   labetalol (NORMODYNE) injection 5 mg  5 mg Intravenous Q2H PRN Eduard ClosKakrakandy, Arshad N, MD       morphine 2 MG/ML injection 1 mg  1 mg Intravenous Q4H PRN Leroy SeaSingh, Prashant K, MD       pantoprazole (PROTONIX) injection 40 mg  40 mg Intravenous Q12H Eduard ClosKakrakandy, Arshad N, MD   40 mg at 06/23/20 [id number]    Allergies as of 06/22/2020   (No Known Allergies)    History reviewed. No pertinent family history.  Social History   Socioeconomic History   Marital status: Widowed    Spouse name: Not on file   Number of children: Not on file   Years of education: Not on file   Highest education level: Not on file  Occupational History   Not on file  Tobacco Use   Smoking status: Never   Smokeless tobacco: Never  Substance and Sexual Activity   Alcohol use: Not Currently   Drug use: Never   Sexual activity: Not on file  Other Topics Concern   Not on file  Social History Narrative   Not on file   Social Determinants of Health   Financial Resource Strain: Not on file  Food Insecurity: Not on file  Transportation Needs: Not on file  Physical Activity: Not on file  Stress: Not on file  Social Connections: Not on file  Intimate Partner Violence: Not on file    Review of Systems: Unable to obtain secondary to dementia .    OBJECTIVE:    Physical Exam: Vital signs in last 24 hours: Temp:  [97.9 F (36.6 C)-98.8 F (37.1 C)] 98.8 F (37.1 C)  (06/11 0850) Pulse Rate:  [110-125] 121 (06/11 0850) Resp:  [16-24] 20 (06/11 0850) BP: (145-179)/(74-98) 149/87 (06/11 0850) SpO2:  [85 %-97 %] 94 % (06/11 0850)   General:   Sleepy frail appearing female in NAD Psych:   pleasantly confused. Cooperative.  Eyes:  Pupils equal, sclera clear, no icterus.   Conjunctiva pink. Ears:  Normal auditory acuity. Nose:  No deformity, discharge,  or lesions. Lungs:  Clear throughout to auscultation.   No wheezes, crackles, or rhonchi.  Heart:  Regular rate and rhythm; no lower extremity edema Abdomen:  Soft, non-distended, nontender, BS active, no palp mass   Rectal:  Deferred  Msk:  Symmetrical without gross deformities. . Neurologic:  Not oriented to place or time Skin:  Intact without significant lesions or rashes.    Scheduled inpatient medications  pantoprazole (PROTONIX) IV  40 mg Intravenous Q12H      Intake/Output from previous day: No intake/output data recorded. Intake/Output this shift: No intake/output data recorded.   Lab Results: Recent Labs    06/22/20 1506 06/23/20 0423  WBC 13.8* 11.2*  HGB 14.7 16.1*  HCT 46.2* 51.0*  PLT 413* 371   BMET Recent Labs    06/22/20 1506 06/23/20 0423  NA 143 146*  K 2.9* 3.2*  CL 101 104  CO2 26 25  GLUCOSE 145* 181*  BUN 33* 33*  CREATININE 1.03* 1.03*  CALCIUM 10.2 10.3   LFT Recent Labs    06/22/20 1506  PROT 8.1  ALBUMIN 3.8  AST 21  ALT 11  ALKPHOS 69  BILITOT 2.0*   PT/INR  No results for input(s): LABPROT, INR in the last 72 hours. Hepatitis Panel No results for input(s): HEPBSAG, HCVAB, HEPAIGM, HEPBIGM in the last 72 hours.   . CBC Latest Ref Rng & Units 06/23/2020 06/22/2020 05/12/2020  WBC 4.0 - 10.5 K/uL 11.2(H) 13.8(H) 8.4  Hemoglobin 12.0 - 15.0 g/dL 16.1(H) 14.7 11.2(L)  Hematocrit 36.0 - 46.0 % 51.0(H) 46.2(H) 35.0(L)  Platelets 150 - 400 K/uL 371 413(H) 264    . CMP Latest Ref Rng & Units 06/23/2020 06/22/2020 05/12/2020  Glucose 70 - 99  mg/dL 580(D) 983(J) 99  BUN 8 - 23 mg/dL 82(N) 05(L) 21  Creatinine 0.44 - 1.00 mg/dL 9.76(B) 3.41(P) 3.79  Sodium 135 - 145 mmol/L 146(H) 143 143  Potassium 3.5 - 5.1 mmol/L 3.2(L) 2.9(L) 3.5  Chloride 98 - 111 mmol/L 104 101 109  CO2 22 - 32 mmol/L 25 26 27   Calcium 8.9 - 10.3 mg/dL 02.4 9.0  Total Protein 6.5 - 8.1 g/dL - 8.1 09.7)  Total Bilirubin 0.3 - 1.2 mg/dL - 2.0(H) 1.1  Alkaline Phos 38 - 126 U/L - 69 47  AST 15 - 41 U/L - 21 20  ALT 0 - 44 U/L - 11 10   Studies/Results: CT Abdomen Pelvis Wo Contrast  Result Date: 06/22/2020 CLINICAL DATA:  Abdominal pain and vomiting. EXAM: CT ABDOMEN AND PELVIS WITHOUT CONTRAST TECHNIQUE: Multidetector CT imaging of the abdomen and pelvis was performed following the standard protocol without IV contrast. COMPARISON:  CT abdomen pelvis dated May 09, 2020. FINDINGS: Lower chest: New consolidation in the left lower lobe, incompletely visualized. Hepatobiliary: No focal liver abnormality is seen. Status post cholecystectomy. No biliary dilatation. Pancreas: Atrophic. No ductal dilatation or surrounding inflammatory changes. Spleen: Normal in size without focal abnormality. Adrenals/Urinary Tract: Adrenal glands are unremarkable. No renal calculi or hydronephrosis. The bladder is unremarkable. 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 The stomach and lower esophagus are distended with fluid. No wall thickening or pneumatosis. No small bowel obstruction. Extensive sigmoid colonic diverticulosis again noted. Normal appendix. Vascular/Lymphatic: Aortic atherosclerosis. No enlarged abdominal or pelvic lymph nodes. Reproductive: Uterus and bilateral adnexa are unremarkable. Other: Unchanged tiny fat containing umbilical hernia. No free fluid or pneumoperitoneum. Musculoskeletal: No acute or significant osseous findings. IMPRESSION: 1. Unchanged mesenteroaxial gastric volvulus  with paraesophageal herniation of part of the distal stomach. The stomach and lower esophagus are distended with fluid, suggestive of low-grade obstruction. This is unchanged in appearance from the prior scan. 2. New consolidation in the left lower lobe, incompletely visualized, concerning for pneumonia. 3. Aortic Atherosclerosis (ICD10-I70.0). Electronically Signed   By: May 11, 2020 M.D.   On: 06/22/2020 21:15   DG Abd Acute W/Chest  Result Date: 06/22/2020 CLINICAL DATA:  Vomiting today. EXAM: DG ABDOMEN ACUTE WITH 1 VIEW CHEST COMPARISON:  CT abdomen pelvis dated May 09, 2020. Chest x-ray dated February 08, 2019. FINDINGS: There is no evidence of dilated bowel loops or free intraperitoneal air. Paucity of small bowel gas. Air and stool are seen in the colon. Unchanged paraesophageal hiatal hernia. No radiopaque calculi or other significant radiographic abnormality is seen. Normal heart size. New patchy opacity in the peripheral left mid lung. Possible small left pleural effusion. The right lung is clear. No pneumothorax. No acute osseous abnormality. IMPRESSION: 1. New patchy opacity in the peripheral left mid lung, suspicious for pneumonia. 2. Possible small left pleural effusion. 3. Unchanged paraesophageal hiatal hernia.  No bowel obstruction. Electronically Signed  By: Obie Dredge M.D.   On: 06/22/2020 19:26    Principal Problem:   Nausea & vomiting Active Problems:   Dementia (HCC)   Hypertension   CAP (community acquired pneumonia)   ARF (acute renal failure) (HCC)    Willette Cluster, NP-C @  06/23/2020, 9:30 AM

## 2020-06-23 NOTE — Interval H&P Note (Signed)
History and Physical Interval Note:  06/23/2020 1:05 PM  Natalie Spencer  has presented today for surgery, with the diagnosis of gastric volvulus.  The various methods of treatment have been discussed with the patient and family. After consideration of risks, benefits and other options for treatment, the patient has consented to  Procedure(s): ESOPHAGOGASTRODUODENOSCOPY (EGD) WITH PROPOFOL (N/A) as a surgical intervention.  The patient's history has been reviewed, patient examined, no change in status, stable for surgery.  I have reviewed the patient's chart and labs.  Questions were answered to the patient's satisfaction.     Venita Lick. Russella Dar

## 2020-06-23 NOTE — Anesthesia Postprocedure Evaluation (Signed)
Anesthesia Post Note  Patient: Natalie Spencer  Procedure(s) Performed: ESOPHAGOGASTRODUODENOSCOPY (EGD) WITH PROPOFOL Gastric DECOMPRESSION     Patient location during evaluation: PACU Anesthesia Type: General Level of consciousness: awake and alert Pain management: pain level controlled Vital Signs Assessment: post-procedure vital signs reviewed and stable Respiratory status: spontaneous breathing, nonlabored ventilation, respiratory function stable and patient connected to nasal cannula oxygen Cardiovascular status: blood pressure returned to baseline and stable Postop Assessment: no apparent nausea or vomiting Anesthetic complications: no   No notable events documented.  Last Vitals:  Vitals:   06/23/20 1520 06/23/20 1535  BP: (!) 141/91 135/74  Pulse: 88 85  Resp: 17 17  Temp:  (!) 36.3 C  SpO2: 92% 93%    Last Pain:  Vitals:   06/23/20 1228  TempSrc: Oral                 Cardelia Sassano,E. Amazin Pincock

## 2020-06-23 NOTE — Consult Note (Signed)
Palliative Medicine Inpatient Consult Note  Reason for consult:  Goals of Care  HPI:  Per intake H&P --> Natalie Spencer is a 87 y.o. female with history of advanced dementia was brought to the ER after patient had nausea vomiting.  Its not sure how long patient has been having vomiting.  Patient was admitted about 2 months ago for similar situation at that time EGD showed esophagitis.  Palliative care has been asked to get involved to discuss goals of care in the setting of advanced dementia and recurrent gastric volvulus.   Clinical Assessment/Goals of Care:  *Please note that this is a verbal dictation therefore any spelling or grammatical errors are due to the "Dragon [employer] One" system interpretation.  I have reviewed medical records including EPIC notes, labs and imaging, received report from bedside RN, assessed the patient who is belching and grabbing at the blanket.    I called patients legal Guardian, Eliot Ford to further discuss diagnosis prognosis, GOC, EOL wishes, disposition and options.   I introduced Palliative Medicine as specialized medical care for people living with serious illness. It focuses on providing relief from the symptoms and stress of a serious illness. The goal is to improve quality of life for both the patient and the family.  Natalie Spencer is from [place], [place].  She is a widow as her husband passed away 3 years ago.  She has a son though she does not have a "healthy dynamic" with him.  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  She is a woman of faith and practices within the [employer] denomination.  Prior to hospitalization Natalie Spencer had been living at [facility] where she is a long-term resident.  Her legal guardian is through [employer] which is a Research scientist (physical sciences) who has been helping Natalie Spencer over the past few years.  A detailed discussion was had today regarding advanced directives - we do have these in the chart though they are  outdated.    Concepts specific to code status, artifical feeding and hydration, continued IV antibiotics and rehospitalization was had.  Patient is a DO NOT RESUSCITATE DO NOT INTUBATE CODE STATUS  Reviewed hopes present sigmoid volvulus.  [name] shares with me that she was admitted for something similar about a month ago at that point time it was thought to be secondary to her hernia.  Reviewed the plan for the gastroenterology team to pursue an EGD to see if anything could be improved upon.  At this point in time [name] shares that [facility] is a very functional severely demented patient and she would wish for if something were noninvasive and easy to reverse to pursue that though if Natalie Spencer situation worsen she would opt to allow her to be comfortable.  We talked about transition to comfort measures in house and what that would entail inclusive of medications to control pain, dyspnea, agitation, nausea, itching, and hiccups.  We discussed stopping all uneccessary measures such as blood draws, needle sticks, and frequent vital signs. Utilized reflective listening throughout our time together.   Discussed the importance of continued conversation with family and their  medical providers regarding overall plan of care and treatment options, ensuring decisions are within the context of the patients values and GOCs.  Decision Maker: Eliot Ford (Legal Guardian) [phone]  SUMMARY OF RECOMMENDATIONS   DNAR/DNI  Plan for EGD per GI  Ideally goal would be for improvement  If not improvements occur plan for comfort oriented care  Ongoing incremental PMT  support  Code Status/Advance Care Planning: DNAR/DNI   Palliative Prophylaxis:  Oral Care, Mobility  Additional Recommendations (Limitations, Scope, Preferences): Continue current scope of care  Psycho-social/Spiritual:  Desire for further Chaplaincy support: Yes Additional Recommendations: Education on volvulus   Prognosis:  Unclear  Discharge Planning: Discharge to [facility] when medically optimized   Vitals:   06/23/20 0130 06/23/20 0443  BP: (!) 145/83 (!) 177/77  Pulse: (!) 122 (!) 119  Resp:  19  Temp:  97.9 F (36.6 C)  SpO2: (!) 85% 94%   No intake or output data in the 24 hours ending 06/23/20 0657  Gen:  Chronically ill appearing caucasian F HEENT: moist mucous membranes CV: Irregular rate and rhythm  PULM: On 3LPM [place] ABD: soft/nontender  EXT: No edema  Neuro: Disoriented  PPS: 20%   This conversation/these recommendations were discussed with patient primary care team, Dr. Thedore Mins  Time In: 1110 Time Out: 1220 Total Time: 70 Greater than 50%  of this time was spent counseling and coordinating care related to the above assessment and plan.  Lamarr Lulas [place] Palliative Medicine Team Team Cell Phone: [phone] Please utilize secure chat with additional questions, if there is no response within 30 minutes please call the above phone number  Palliative Medicine Team providers are available by phone from 7am to 7pm daily and can be reached through the team cell phone.  Should this patient require assistance outside of these hours, please call the patient's attending physician.

## 2020-06-23 NOTE — Consult Note (Addendum)
Referring Provider:  [facility]         Primary Care Physician:  Patient, No Pcp Per (Inactive) Primary Gastroenterologist:    unassigned / Dr. Marina Goodell evaluated as inpatient in April 2022        We were asked to see this patient for:  nausea , vomiting, paraesophageal hernia, gastric volvulus.               ASSESSMENT / PLAN:   # 87 yo female with nausea / vomiting. CT scan remarkable for mesenteric axial volvulus of stomach with fluid distending the stomach and esophagus.  --Nursing staff unable to place NGT ( led to vomiting and concern for aspiration)  --Keep NPO. Will plan for EGD to be done this if I can get in touch with her legal guardian / POA Natalie Spencer)  ADDENDUM: I was unable to reach Guardian Natalie Spencer. I left a VM for her to call me back. However [facility] just called me. He spoke to [facility] ) and she is fine with proceeding with EGD. I tried to call her myself to discuss EGD but did not get an answer. Will Dr. Thedore Mins to consign consent form with Dr. Russella Dar (we can proceed with consent of two physicians).   # Possible PNA, on antibiotics  # AKI, creatinine 1.03. GFR 51.   # FOBT positive stool. He is hemoconcentrated so hard to know what true hemoglobin is but not overt GI bleeding.  --Agree with BID IV PPI.   # Hypokalemia, replacement in progress, His K+ improved  2.9 >> 3.2  # HTN, BP improving with IV Labetalol.      Attending Physician Note   I have taken a history, examined the patient and reviewed the chart. I agree with the Advanced Practitioner's note, impression and recommendations.  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 Nursing staff unable to place NG tube for decompression.   Recommend EGD to further evaluate and to place NG tube. Attempting to obtain consent from her guardian / POA and if not available will proceed  with 2 physicians attesting to urgent need for EGD. PNA, AKI, hypokalemia, HTN, dementia mgmt per primary service.   Claudette Head, MD FACG [phone]       HPI:                                                                                                                             Chief Complaint: nausea and vomiting.   Natalie Spencer is a 87 y.o. female with a past medical history significant for advanced dementia, HTN, CVA, paraesophageal hernia, cholecystectomy  We saw patient during April 2022 admission for hematemesis. EGD showed mild esophagitis and a small paraesophageal hernia. Patient back to ED yesterday from [facility] for nausea / vomiting.   *Patient is unable provide history. She is  confused  In ED patient was afebrile, hypertensive, and tachycardic. She was volume depleted and with AKI. She was hypokalemic. Her WBC was 13.8, hgb 17.7. Tbili 2.0, remainder of liver tests normal. FOBT+. Noncon CT scan without hepatobiliary findings. 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 Stomach and lower esophagus distended with fluid. New consolidation in LLL concerning for PNA. ED discussed case with [facility] - Dr. Dwain Sarna. Given age, dementia and prior symptoms improvement with conservative measures he recommended conservative treatment for now. Started on IVF, antibiotics and admitted for further management.   PREVIOUS ENDOSCOPIC EVALUATIONS / PERTINENT STUDIES   April 2022 EGD for hematemesis Mild esophagitis. Small paraesophageal hernia. Dilated stomach without obstruction or other abnormality. Otherwise normal EGD.  06/22/20 Noncon CT scan IMPRESSION: 1. 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 The stomach and lower esophagus are distended with fluid, suggestive of low-grade obstruction. This is unchanged in appearance from the prior scan. 2. New consolidation  in the left lower lobe, incompletely visualized, concerning for pneumonia. 3. Aortic Atherosclerosis (ICD10-I70.0   Past Medical History:  Diagnosis Date   Ambulatory dysfunction    Hypertension    Muscle weakness (generalized)    Senile dementia (HCC)    Stroke [facility])     Past Surgical History:  Procedure Laterality Date   CATARACT EXTRACTION, BILATERAL     ESOPHAGOGASTRODUODENOSCOPY (EGD) WITH PROPOFOL N/A 05/10/2020   Procedure: ESOPHAGOGASTRODUODENOSCOPY (EGD) WITH PROPOFOL;  Surgeon: Hilarie Fredrickson, MD;  Location: [facility] ENDOSCOPY;  Service: Endoscopy;  Laterality: N/A;   LAPAROSCOPIC CHOLECYSTECTOMY     RUPTURED GLOBE EXPLORATION AND REPAIR Left 02/09/2018   Procedure: REPAIR OF RUPTURED GLOBE LEFT EYE;  Surgeon: Carmela Rima, MD;  Location: [facility] OR;  Service: Ophthalmology;  Laterality: Left;    Prior to Admission medications   Medication Sig Start Date End Date Taking? Authorizing Provider  amLODipine (NORVASC) 5 MG tablet Take 5 mg by mouth daily.   Yes [provider]  melatonin 5 MG TABS Take 5 mg by mouth at bedtime.   Yes [provider]  ondansetron (ZOFRAN ODT) 4 MG disintegrating tablet Take 1 tablet (4 mg total) by mouth every 8 (eight) hours as needed for nausea or vomiting. 05/08/20  Yes Alvira Monday, MD  pantoprazole (PROTONIX) 40 MG tablet Take 1 tablet (40 mg total) by mouth 2 (two) times daily. 05/11/20  Yes Lanae Boast, MD  traMADol (ULTRAM) 50 MG tablet Take 1 tablet (50 mg total) by mouth every 6 (six) hours as needed for moderate pain. Patient taking differently: Take 50 mg by mouth every 6 (six) hours as needed (for pain). 02/15/18  Yes Barnetta Chapel, PA-C  LORazepam (ATIVAN) 0.5 MG tablet Take 0.25 mg by mouth every 6 (six) hours as needed for anxiety (or agitation).    [provider]    Current Facility-Administered Medications  Medication Dose Route Frequency Provider Last Rate Last Admin   acetaminophen (TYLENOL) tablet 650 mg   650 mg Oral Q6H PRN Eduard Clos, MD       Or   acetaminophen (TYLENOL) suppository 650 mg  650 mg Rectal Q6H PRN Eduard Clos, MD       azithromycin (ZITHROMAX) 500 mg in sodium chloride 0.9 % 250 mL IVPB  500 mg Intravenous Q24H Eduard Clos, MD       cefTRIAXone (ROCEPHIN) 2 g in sodium chloride 0.9 % 100 mL IVPB  2 g Intravenous  Q24H Eduard ClosKakrakandy, Arshad N, MD       dextrose 5 % and 0.9 % NaCl with KCl 20 mEq/L infusion   Intravenous Continuous Eduard ClosKakrakandy, Arshad N, MD 75 mL/hr at 06/23/20 0054 New Bag at 06/23/20 0054   labetalol (NORMODYNE) injection 5 mg  5 mg Intravenous Q2H PRN Eduard ClosKakrakandy, Arshad N, MD       morphine 2 MG/ML injection 1 mg  1 mg Intravenous Q4H PRN Leroy SeaSingh, Prashant K, MD       pantoprazole (PROTONIX) injection 40 mg  40 mg Intravenous Q12H Eduard ClosKakrakandy, Arshad N, MD   40 mg at 06/23/20 [id number]    Allergies as of 06/22/2020   (No Known Allergies)    History reviewed. No pertinent family history.  Social History   Socioeconomic History   Marital status: Widowed    Spouse name: Not on file   Number of children: Not on file   Years of education: Not on file   Highest education level: Not on file  Occupational History   Not on file  Tobacco Use   Smoking status: Never   Smokeless tobacco: Never  Substance and Sexual Activity   Alcohol use: Not Currently   Drug use: Never   Sexual activity: Not on file  Other Topics Concern   Not on file  Social History Narrative   Not on file   Social Determinants of Health   Financial Resource Strain: Not on file  Food Insecurity: Not on file  Transportation Needs: Not on file  Physical Activity: Not on file  Stress: Not on file  Social Connections: Not on file  Intimate Partner Violence: Not on file    Review of Systems: Unable to obtain secondary to dementia .    OBJECTIVE:    Physical Exam: Vital signs in last 24 hours: Temp:  [97.9 F (36.6 C)-98.8 F (37.1 C)] 98.8 F (37.1 C)  (06/11 0850) Pulse Rate:  [110-125] 121 (06/11 0850) Resp:  [16-24] 20 (06/11 0850) BP: (145-179)/(74-98) 149/87 (06/11 0850) SpO2:  [85 %-97 %] 94 % (06/11 0850)   General:   Sleepy frail appearing female in NAD Psych:   pleasantly confused. Cooperative.  Eyes:  Pupils equal, sclera clear, no icterus.   Conjunctiva pink. Ears:  Normal auditory acuity. Nose:  No deformity, discharge,  or lesions. Lungs:  Clear throughout to auscultation.   No wheezes, crackles, or rhonchi.  Heart:  Regular rate and rhythm; no lower extremity edema Abdomen:  Soft, non-distended, nontender, BS active, no palp mass   Rectal:  Deferred  Msk:  Symmetrical without gross deformities. . Neurologic:  Not oriented to place or time Skin:  Intact without significant lesions or rashes.    Scheduled inpatient medications  pantoprazole (PROTONIX) IV  40 mg Intravenous Q12H      Intake/Output from previous day: No intake/output data recorded. Intake/Output this shift: No intake/output data recorded.   Lab Results: Recent Labs    06/22/20 1506 06/23/20 0423  WBC 13.8* 11.2*  HGB 14.7 16.1*  HCT 46.2* 51.0*  PLT 413* 371   BMET Recent Labs    06/22/20 1506 06/23/20 0423  NA 143 146*  K 2.9* 3.2*  CL 101 104  CO2 26 25  GLUCOSE 145* 181*  BUN 33* 33*  CREATININE 1.03* 1.03*  CALCIUM 10.2 10.3   LFT Recent Labs    06/22/20 1506  PROT 8.1  ALBUMIN 3.8  AST 21  ALT 11  ALKPHOS 69  BILITOT 2.0*   PT/INR  No results for input(s): LABPROT, INR in the last 72 hours. Hepatitis Panel No results for input(s): HEPBSAG, HCVAB, HEPAIGM, HEPBIGM in the last 72 hours.   . CBC Latest Ref Rng & Units 06/23/2020 06/22/2020 05/12/2020  WBC 4.0 - 10.5 K/uL 11.2(H) 13.8(H) 8.4  Hemoglobin 12.0 - 15.0 g/dL 16.1(H) 14.7 11.2(L)  Hematocrit 36.0 - 46.0 % 51.0(H) 46.2(H) 35.0(L)  Platelets 150 - 400 K/uL 371 413(H) 264    . CMP Latest Ref Rng & Units 06/23/2020 06/22/2020 05/12/2020  Glucose 70 - 99  mg/dL 580(D) 983(J) 99  BUN 8 - 23 mg/dL 82(N) 05(L) 21  Creatinine 0.44 - 1.00 mg/dL 9.76(B) 3.41(P) 3.79  Sodium 135 - 145 mmol/L 146(H) 143 143  Potassium 3.5 - 5.1 mmol/L 3.2(L) 2.9(L) 3.5  Chloride 98 - 111 mmol/L 104 101 109  CO2 22 - 32 mmol/L 25 26 27   Calcium 8.9 - 10.3 mg/dL 02.4 9.0  Total Protein 6.5 - 8.1 g/dL - 8.1 09.7)  Total Bilirubin 0.3 - 1.2 mg/dL - 2.0(H) 1.1  Alkaline Phos 38 - 126 U/L - 69 47  AST 15 - 41 U/L - 21 20  ALT 0 - 44 U/L - 11 10   Studies/Results: CT Abdomen Pelvis Wo Contrast  Result Date: 06/22/2020 CLINICAL DATA:  Abdominal pain and vomiting. EXAM: CT ABDOMEN AND PELVIS WITHOUT CONTRAST TECHNIQUE: Multidetector CT imaging of the abdomen and pelvis was performed following the standard protocol without IV contrast. COMPARISON:  CT abdomen pelvis dated May 09, 2020. FINDINGS: Lower chest: New consolidation in the left lower lobe, incompletely visualized. Hepatobiliary: No focal liver abnormality is seen. Status post cholecystectomy. No biliary dilatation. Pancreas: Atrophic. No ductal dilatation or surrounding inflammatory changes. Spleen: Normal in size without focal abnormality. Adrenals/Urinary Tract: Adrenal glands are unremarkable. No renal calculi or hydronephrosis. The bladder is unremarkable. 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 The stomach and lower esophagus are distended with fluid. No wall thickening or pneumatosis. No small bowel obstruction. Extensive sigmoid colonic diverticulosis again noted. Normal appendix. Vascular/Lymphatic: Aortic atherosclerosis. No enlarged abdominal or pelvic lymph nodes. Reproductive: Uterus and bilateral adnexa are unremarkable. Other: Unchanged tiny fat containing umbilical hernia. No free fluid or pneumoperitoneum. Musculoskeletal: No acute or significant osseous findings. IMPRESSION: 1. Unchanged mesenteroaxial gastric volvulus  with paraesophageal herniation of part of the distal stomach. The stomach and lower esophagus are distended with fluid, suggestive of low-grade obstruction. This is unchanged in appearance from the prior scan. 2. New consolidation in the left lower lobe, incompletely visualized, concerning for pneumonia. 3. Aortic Atherosclerosis (ICD10-I70.0). Electronically Signed   By: May 11, 2020 M.D.   On: 06/22/2020 21:15   DG Abd Acute W/Chest  Result Date: 06/22/2020 CLINICAL DATA:  Vomiting today. EXAM: DG ABDOMEN ACUTE WITH 1 VIEW CHEST COMPARISON:  CT abdomen pelvis dated May 09, 2020. Chest x-ray dated February 08, 2019. FINDINGS: There is no evidence of dilated bowel loops or free intraperitoneal air. Paucity of small bowel gas. Air and stool are seen in the colon. Unchanged paraesophageal hiatal hernia. No radiopaque calculi or other significant radiographic abnormality is seen. Normal heart size. New patchy opacity in the peripheral left mid lung. Possible small left pleural effusion. The right lung is clear. No pneumothorax. No acute osseous abnormality. IMPRESSION: 1. New patchy opacity in the peripheral left mid lung, suspicious for pneumonia. 2. Possible small left pleural effusion. 3. Unchanged paraesophageal hiatal hernia.  No bowel obstruction. Electronically Signed  By: Obie Dredge M.D.   On: 06/22/2020 19:26    Principal Problem:   Nausea & vomiting Active Problems:   Dementia (HCC)   Hypertension   CAP (community acquired pneumonia)   ARF (acute renal failure) (HCC)    Willette Cluster, NP-C @  06/23/2020, 9:30 AM

## 2020-06-23 NOTE — Consult Note (Signed)
Reason for Consult:n/v, peh with chronic gastric volvulus Referring Physician: Dr Clerance Lav Natalie Spencer is an 87 y.o. female.  HPI: 55 yof with advanced dementia presents with only her caregiver (it is her first night taking care of her) with n/v.  Unsure of how long. No history really available to me.  She was admitted in April with similar thing. She underwent ct scan that is really unchanged from before and I was asked to see her.    Past Medical History:  Diagnosis Date   Ambulatory dysfunction    Hypertension    Muscle weakness (generalized)    Senile dementia (HCC)    Stroke [facility])     Past Surgical History:  Procedure Laterality Date   CATARACT EXTRACTION, BILATERAL     ESOPHAGOGASTRODUODENOSCOPY (EGD) WITH PROPOFOL N/A 05/10/2020   Procedure: ESOPHAGOGASTRODUODENOSCOPY (EGD) WITH PROPOFOL;  Surgeon: Hilarie Fredrickson, MD;  Location: [facility] ENDOSCOPY;  Service: Endoscopy;  Laterality: N/A;   LAPAROSCOPIC CHOLECYSTECTOMY     RUPTURED GLOBE EXPLORATION AND REPAIR Left 02/09/2018   Procedure: REPAIR OF RUPTURED GLOBE LEFT EYE;  Surgeon: Carmela Rima, MD;  Location: [facility] OR;  Service: Ophthalmology;  Laterality: Left;    History reviewed. No pertinent family history.  Social History:  reports that she has never smoked. She has never used smokeless tobacco. She reports previous alcohol use. She reports that she does not use drugs.  Allergies: No Known Allergies  Medications: I have reviewed the patient's current medications.  Results for orders placed or performed during the hospital encounter of 06/22/20 (from the past 48 hour(s))  Comprehensive metabolic panel     Status: Abnormal   Collection Time: 06/22/20  3:06 PM  Result Value Ref Range   Sodium 143 135 - 145 mmol/L   Potassium 2.9 (L) 3.5 - 5.1 mmol/L   Chloride 101 98 - 111 mmol/L   CO2 26 22 - 32 mmol/L   Glucose, Bld 145 (H) 70 - 99 mg/dL    Comment: Glucose reference range applies only to samples taken after fasting for  at least 8 hours.   BUN 33 (H) 8 - 23 mg/dL   Creatinine, Ser 8.84 (H) 0.44 - 1.00 mg/dL   Calcium 16.6 8.9 - 06.3 mg/dL   Total Protein 8.1 6.5 - 8.1 g/dL   Albumin 3.8 3.5 - 5.0 g/dL   AST 21 15 - 41 U/L   ALT 11 0 - 44 U/L   Alkaline Phosphatase 69 38 - 126 U/L   Total Bilirubin 2.0 (H) 0.3 - 1.2 mg/dL   GFR, Estimated 51 (L) >60 mL/min    Comment: (NOTE) Calculated using the CKD-EPI Creatinine Equation (2021)    Anion gap 16 (H) 5 - 15    Comment: Performed at [facility], [address], [place], [place] [address]  CBC     Status: Abnormal   Collection Time: 06/22/20  3:06 PM  Result Value Ref Range   WBC 13.8 (H) 4.0 - 10.5 K/uL   RBC 5.22 (H) 3.87 - 5.11 MIL/uL   Hemoglobin 14.7 12.0 - 15.0 g/dL   HCT 09.3 (H) 23.5 - 57.3 %   MCV 88.5 80.0 - 100.0 fL   MCH 28.2 26.0 - 34.0 pg   MCHC 31.8 30.0 - 36.0 g/dL   RDW 22.0 25.4 - 27.0 %   Platelets 413 (H) 150 - 400 K/uL   nRBC 0.0 0.0 - 0.2 %    Comment: Performed at [facility], [address] [place], [place] [address]  POC occult blood, ED Provider will collect     Status: Abnormal   Collection Time: 06/22/20  3:08 PM  Result Value Ref Range   Fecal Occult Bld POSITIVE (A) NEGATIVE    CT Abdomen Pelvis Wo Contrast  Result Date: 06/22/2020 CLINICAL DATA:  Abdominal pain and vomiting. EXAM: CT ABDOMEN AND PELVIS WITHOUT CONTRAST TECHNIQUE: Multidetector CT imaging of the abdomen and pelvis was performed following the standard protocol without IV contrast. COMPARISON:  CT abdomen pelvis dated May 09, 2020. FINDINGS: Lower chest: New consolidation in the left lower lobe, incompletely visualized. Hepatobiliary: No focal liver abnormality is seen. Status post cholecystectomy. No biliary dilatation. Pancreas: Atrophic. No ductal dilatation or surrounding inflammatory changes. Spleen: Normal in size without focal abnormality. Adrenals/Urinary Tract: Adrenal glands are unremarkable. No renal calculi or hydronephrosis.  The bladder is unremarkable. 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 The stomach and lower esophagus are distended with fluid. No wall thickening or pneumatosis. No small bowel obstruction. Extensive sigmoid colonic diverticulosis again noted. Normal appendix. Vascular/Lymphatic: Aortic atherosclerosis. No enlarged abdominal or pelvic lymph nodes. Reproductive: Uterus and bilateral adnexa are unremarkable. Other: Unchanged tiny fat containing umbilical hernia. No free fluid or pneumoperitoneum. Musculoskeletal: No acute or significant osseous findings. IMPRESSION: 1. 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 The stomach and lower esophagus are distended with fluid, suggestive of low-grade obstruction. This is unchanged in appearance from the prior scan. 2. New consolidation in the left lower lobe, incompletely visualized, concerning for pneumonia. 3. Aortic Atherosclerosis (ICD10-I70.0). Electronically Signed   By: Obie Dredge M.D.   On: 06/22/2020 21:15   DG Abd Acute W/Chest  Result Date: 06/22/2020 CLINICAL DATA:  Vomiting today. EXAM: DG ABDOMEN ACUTE WITH 1 VIEW CHEST COMPARISON:  CT abdomen pelvis dated May 09, 2020. Chest x-ray dated February 08, 2019. FINDINGS: There is no evidence of dilated bowel loops or free intraperitoneal air. Paucity of small bowel gas. Air and stool are seen in the colon. Unchanged paraesophageal hiatal hernia. No radiopaque calculi or other significant radiographic abnormality is seen. Normal heart size. New patchy opacity in the peripheral left mid lung. Possible small left pleural effusion. The right lung is clear. No pneumothorax. No acute osseous abnormality. IMPRESSION: 1. New patchy opacity in the peripheral left mid lung, suspicious for pneumonia. 2. Possible small left pleural effusion. 3. Unchanged paraesophageal hiatal  hernia.  No bowel obstruction. Electronically Signed   By: Obie Dredge M.D.   On: 06/22/2020 19:26    Review of Systems  Unable to perform ROS: Dementia  Blood pressure (!) 167/84, pulse (!) 125, temperature 98.5 F (36.9 C), temperature source Oral, resp. rate (!) 24, SpO2 92 %. Physical Exam Constitutional:      Comments: Not oriented, does respond  Eyes:     General: No scleral icterus. Cardiovascular:     Rate and Rhythm: Regular rhythm. Tachycardia present.  Pulmonary:     Effort: Pulmonary effort is normal.  Abdominal:     General: Bowel sounds are normal.     Palpations: Abdomen is soft.     Tenderness: There is no abdominal tenderness.     Hernia: No hernia is present.    Assessment/Plan: PEH with chronic gastric volvulus -similar presentation to last time -no role for acute surgical intervention -ngtube and gi to see would be reasonable -I think bigger question are goals of care  which I dont know have been addressed with her dementia. I find it hard to envision a scenario where surgery will be of benefit to her.   Natalie Spencer 06/23/2020, 12:14 AM

## 2020-06-23 NOTE — ED Notes (Signed)
Blood draw unsuccessful 

## 2020-06-23 NOTE — Progress Notes (Signed)
PROGRESS NOTE                                                                                                                                                                                                             Patient Demographics:    Natalie Spencer, is a 87 y.o. female, DOB - 12/09/1926, ZOX:[id number]  Outpatient Primary MD for the patient is Patient, No Pcp Per (Inactive)    LOS - 1  Admit date - 06/22/2020    Chief Complaint  Patient presents with   Emesis       Brief Narrative (HPI from H&P)  - Natalie Spencer is a 87 y.o. female with history of advanced dementia was brought to the ER after patient had nausea vomiting.  Its not sure how long patient has been having vomiting.  Patient was admitted about 2 months ago for similar situation at that time EGD showed esophagitis.  Work-up in the ER at this time showed that patient had gastric volvulus with obstruction, GI general surgery were consulted and she was admitted for further treatment.   Subjective:    Natalie Spencer in bed somewhat confused appears to be in no distress but overall unreliable historian, she says she is mildly nauseated, denies any chest pain, does complain of some mild headache and abdominal pain.   Assessment  & Plan :     Nausea vomiting with CT scan showing mesenteric axial volvulus of the stomach with possible obstruction - she remains n.p.o., NG insertion x3 failed on the floor, GI on board planning to do EGD, discussed with POA Ms. Jacqlyn KraussSylvester who is agreeable for EGD or a minimally invasive surgery if needed, if significant decline then comfort measures. Possible pneumonia for which patient is on empiric antibiotics. Hypokalemia likely from vomiting which we will replace and recheck. History of hypertension we will keep patient on as needed IV labetalol and add Catapres patch. History of dementia. Acute renal failure from vomiting hydrate  and recheck metabolic panel. Possible GI bleed per we will check serial CBCs.  On IV Protonix.      Condition - Extremely Guarded  Family Communication  :  POA Eliot Ford- Sylvester,[name] [phone]- [phone] - on 06/23/20  Code Status :  DNR  Consults  :  GI, CCS  PUD Prophylaxis : PPI   Procedures  :  Disposition Plan  :    Status is: Inpatient  Remains inpatient appropriate because:IV treatments appropriate due to intensity of illness or inability to take PO  Dispo: The patient is from: SNF              Anticipated d/c is to: SNF              Patient currently is not medically stable to d/c.   Difficult to place patient No  DVT Prophylaxis  :    SCDs Start: 06/22/20 2307   Lab Results  Component Value Date   PLT 371 06/23/2020    Diet :  Diet Order             Diet NPO time specified  Diet effective now                    Inpatient Medications  Scheduled Meds:  pantoprazole (PROTONIX) IV  40 mg Intravenous Q12H   Continuous Infusions:  azithromycin     cefTRIAXone (ROCEPHIN)  IV     dextrose 5 % and 0.9 % NaCl with KCl 20 mEq/L 75 mL/hr at 06/23/20 0054   PRN Meds:.acetaminophen **OR** acetaminophen, labetalol, morphine injection  Antibiotics  :    Anti-infectives (From admission, onward)    Start     Dose/Rate Route Frequency Ordered Stop   06/23/20 2200  cefTRIAXone (ROCEPHIN) 2 g in sodium chloride 0.9 % 100 mL IVPB        2 g 200 mL/hr over 30 Minutes Intravenous Every 24 hours 06/22/20 2308 06/28/20 2159   06/23/20 2200  azithromycin (ZITHROMAX) 500 mg in sodium chloride 0.9 % 250 mL IVPB        500 mg 250 mL/hr over 60 Minutes Intravenous Every 24 hours 06/22/20 2308 06/28/20 2159   06/22/20 2045  azithromycin (ZITHROMAX) 500 mg in sodium chloride 0.9 % 250 mL IVPB        500 mg 250 mL/hr over 60 Minutes Intravenous  Once 06/22/20 2039 06/22/20 2330   06/22/20 2045  cefTRIAXone (ROCEPHIN) 1 g in sodium chloride 0.9 % 100 mL IVPB         1 g 200 mL/hr over 30 Minutes Intravenous  Once 06/22/20 2039 06/22/20 2204        Time Spent in minutes  30   Susa Raring M.D on 06/23/2020 at 10:30 AM  To page go to [email or web address]   [facility] -  Office  [phone]   See all Orders from Spencer for further details    Objective:   Vitals:   06/23/20 0053 06/23/20 0130 06/23/20 0443 06/23/20 0850  BP: (!) 179/88 (!) 145/83 (!) 177/77 (!) 149/87  Pulse: (!) 118 (!) 122 (!) 119 (!) 121  Resp: (!) 22  19 20   Temp:   97.9 F (36.6 C) 98.8 F (37.1 C)  TempSrc:   Axillary Axillary  SpO2: 92% (!) 85% 94% 94%    Wt Readings from Last 3 Encounters:  05/08/20 54.4 kg    No intake or output data in the 24 hours ending 06/23/20 1030   Physical Exam  Awake but pleasantly confused, moving all 4 extremities Natalie Spencer,Natalie Spencer,No JVD, No cervical lymphadenopathy appriciated.  Symmetrical Chest wall movement, Good air movement bilaterally, CTAB RRR,No Gallops,Rubs or new Murmurs, No Parasternal Heave +ve B.Sounds, Abd Soft, No tenderness, No organomegaly appriciated, No rebound - guarding or rigidity. No Cyanosis, Clubbing or edema, No new Rash or bruise  RN pressure injury documentation:     Data Review:    CBC Recent Labs  Lab 06/22/20 1506 06/23/20 0423  WBC 13.8* 11.2*  HGB 14.7 16.1*  HCT 46.2* 51.0*  PLT 413* 371  MCV 88.5 88.5  MCH 28.2 28.0  MCHC 31.8 31.6  RDW 13.9 14.1    Recent Labs  Lab 06/22/20 1506 06/23/20 0423  NA 143 146*  K 2.9* 3.2*  CL 101 104  CO2 26 25  GLUCOSE 145* 181*  BUN 33* 33*  CREATININE 1.03* 1.03*  CALCIUM 10.2 10.3  AST 21  --   ALT 11  --   ALKPHOS 69  --   BILITOT 2.0*  --   ALBUMIN 3.8  --     ------------------------------------------------------------------------------------------------------------------ No results for input(s): CHOL, HDL, LDLCALC, TRIG, CHOLHDL, LDLDIRECT in the last 72 hours.  No results found for:  HGBA1C ------------------------------------------------------------------------------------------------------------------ No results for input(s): TSH, T4TOTAL, T3FREE, THYROIDAB in the last 72 hours.  Invalid input(s): FREET3  Cardiac Enzymes No results for input(s): CKMB, TROPONINI, MYOGLOBIN in the last 168 hours.  Invalid input(s): CK ------------------------------------------------------------------------------------------------------------------ No results found for: BNP  Micro Results Recent Results (from the past 240 hour(s))  Resp Panel by RT-PCR (Flu A&B, Covid) Nasopharyngeal Swab     Status: None   Collection Time: 06/23/20 12:30 AM   Specimen: Nasopharyngeal Swab; Nasopharyngeal(NP) swabs in vial transport medium  Result Value Ref Range Status   SARS Coronavirus 2 by RT PCR NEGATIVE NEGATIVE Final    Comment: (NOTE) SARS-CoV-2 target nucleic acids are NOT DETECTED.  The SARS-CoV-2 RNA is generally detectable in upper respiratory specimens during the acute phase of infection. The lowest concentration of SARS-CoV-2 viral copies this assay can detect is 138 copies/mL. A negative result does not preclude SARS-Cov-2 infection and should not be used as the sole basis for treatment or other patient management decisions. A negative result may occur with  improper specimen collection/handling, submission of specimen other than nasopharyngeal swab, presence of viral mutation(s) within the areas targeted by this assay, and inadequate number of viral copies(<138 copies/mL). A negative result must be combined with clinical observations, patient history, and epidemiological information. The expected result is Negative.  Fact Sheet for Patients:  BloggerCourse.com  Fact Sheet for Healthcare Providers:  SeriousBroker.it  This test is no t yet approved or cleared by the [place] FDA and  has been authorized for detection  and/or diagnosis of SARS-CoV-2 by FDA under an Emergency Use Authorization (EUA). This EUA will remain  in effect (meaning this test can be used) for the duration of the COVID-19 declaration under Section 564(b)(1) of the Act, 21 U.S.C.section 360bbb-3(b)(1), unless the authorization is terminated  or revoked sooner.       Influenza A by PCR NEGATIVE NEGATIVE Final   Influenza B by PCR NEGATIVE NEGATIVE Final    Comment: (NOTE) The Xpert Xpress SARS-CoV-2/FLU/RSV plus assay is intended as an aid in the diagnosis of influenza from Nasopharyngeal swab specimens and should not be used as a sole basis for treatment. Nasal washings and aspirates are unacceptable for Xpert Xpress SARS-CoV-2/FLU/RSV testing.  Fact Sheet for Patients: BloggerCourse.com  Fact Sheet for Healthcare Providers: SeriousBroker.it  This test is not yet approved or cleared by the [place] FDA and has been authorized for detection and/or diagnosis of SARS-CoV-2 by FDA under an Emergency Use Authorization (EUA). This EUA will remain in effect (meaning this test can be used) for the duration of the COVID-19 declaration under  Section 564(b)(1) of the Act, 21 U.S.C. section 360bbb-3(b)(1), unless the authorization is terminated or revoked.  Performed at [facility], [address], [place], [place] [address]     Radiology Reports CT Abdomen Pelvis Wo Contrast  Result Date: 06/22/2020 CLINICAL DATA:  Abdominal pain and vomiting. EXAM: CT ABDOMEN AND PELVIS WITHOUT CONTRAST TECHNIQUE: Multidetector CT imaging of the abdomen and pelvis was performed following the standard protocol without IV contrast. COMPARISON:  CT abdomen pelvis dated May 09, 2020. FINDINGS: Lower chest: New consolidation in the left lower lobe, incompletely visualized. Hepatobiliary: No focal liver abnormality is seen. Status post cholecystectomy. No biliary dilatation. Pancreas:  Atrophic. No ductal dilatation or surrounding inflammatory changes. Spleen: Normal in size without focal abnormality. Adrenals/Urinary Tract: Adrenal glands are unremarkable. No renal calculi or hydronephrosis. The bladder is unremarkable. 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 The stomach and lower esophagus are distended with fluid. No wall thickening or pneumatosis. No small bowel obstruction. Extensive sigmoid colonic diverticulosis again noted. Normal appendix. Vascular/Lymphatic: Aortic atherosclerosis. No enlarged abdominal or pelvic lymph nodes. Reproductive: Uterus and bilateral adnexa are unremarkable. Other: Unchanged tiny fat containing umbilical hernia. No free fluid or pneumoperitoneum. Musculoskeletal: No acute or significant osseous findings. IMPRESSION: 1. 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 The stomach and lower esophagus are distended with fluid, suggestive of low-grade obstruction. This is unchanged in appearance from the prior scan. 2. New consolidation in the left lower lobe, incompletely visualized, concerning for pneumonia. 3. Aortic Atherosclerosis (ICD10-I70.0). Electronically Signed   By: Obie Dredge M.D.   On: 06/22/2020 21:15   DG Abd Acute W/Chest  Result Date: 06/22/2020 CLINICAL DATA:  Vomiting Spencer. EXAM: DG ABDOMEN ACUTE WITH 1 VIEW CHEST COMPARISON:  CT abdomen pelvis dated May 09, 2020. Chest x-ray dated February 08, 2019. FINDINGS: There is no evidence of dilated bowel loops or free intraperitoneal air. Paucity of small bowel gas. Air and stool are seen in the colon. Unchanged paraesophageal hiatal hernia. No radiopaque calculi or other significant radiographic abnormality is seen. Normal heart size. New patchy opacity in the peripheral left mid lung. Possible small left pleural effusion. The right lung is clear. No  pneumothorax. No acute osseous abnormality. IMPRESSION: 1. New patchy opacity in the peripheral left mid lung, suspicious for pneumonia. 2. Possible small left pleural effusion. 3. Unchanged paraesophageal hiatal hernia.  No bowel obstruction. Electronically Signed   By: Obie Dredge M.D.   On: 06/22/2020 19:26

## 2020-06-24 ENCOUNTER — Inpatient Hospital Stay (HOSPITAL_COMMUNITY): Payer: Medicare Other

## 2020-06-24 DIAGNOSIS — I10 Essential (primary) hypertension: Secondary | ICD-10-CM

## 2020-06-24 LAB — GLUCOSE, CAPILLARY
Glucose-Capillary: 110 mg/dL — ABNORMAL HIGH (ref 70–99)
Glucose-Capillary: 112 mg/dL — ABNORMAL HIGH (ref 70–99)
Glucose-Capillary: 116 mg/dL — ABNORMAL HIGH (ref 70–99)
Glucose-Capillary: 122 mg/dL — ABNORMAL HIGH (ref 70–99)
Glucose-Capillary: 140 mg/dL — ABNORMAL HIGH (ref 70–99)

## 2020-06-24 LAB — CBC WITH DIFFERENTIAL/PLATELET
Abs Immature Granulocytes: 0.06 10*3/uL (ref 0.00–0.07)
Basophils Absolute: 0 10*3/uL (ref 0.0–0.1)
Basophils Relative: 0 %
Eosinophils Absolute: 0 10*3/uL (ref 0.0–0.5)
Eosinophils Relative: 0 %
HCT: 40.2 % (ref 36.0–46.0)
Hemoglobin: 12.5 g/dL (ref 12.0–15.0)
Immature Granulocytes: 1 %
Lymphocytes Relative: 17 %
Lymphs Abs: 1.6 10*3/uL (ref 0.7–4.0)
MCH: 28 pg (ref 26.0–34.0)
MCHC: 31.1 g/dL (ref 30.0–36.0)
MCV: 89.9 fL (ref 80.0–100.0)
Monocytes Absolute: 0.7 10*3/uL (ref 0.1–1.0)
Monocytes Relative: 7 %
Neutro Abs: 7.2 10*3/uL (ref 1.7–7.7)
Neutrophils Relative %: 75 %
Platelets: 318 10*3/uL (ref 150–400)
RBC: 4.47 MIL/uL (ref 3.87–5.11)
RDW: 14.1 % (ref 11.5–15.5)
WBC: 9.5 10*3/uL (ref 4.0–10.5)
nRBC: 0 % (ref 0.0–0.2)

## 2020-06-24 LAB — COMPREHENSIVE METABOLIC PANEL
ALT: 12 U/L (ref 0–44)
AST: 19 U/L (ref 15–41)
Albumin: 2.6 g/dL — ABNORMAL LOW (ref 3.5–5.0)
Alkaline Phosphatase: 47 U/L (ref 38–126)
Anion gap: 7 (ref 5–15)
BUN: 30 mg/dL — ABNORMAL HIGH (ref 8–23)
CO2: 28 mmol/L (ref 22–32)
Calcium: 9.3 mg/dL (ref 8.9–10.3)
Chloride: 113 mmol/L — ABNORMAL HIGH (ref 98–111)
Creatinine, Ser: 0.95 mg/dL (ref 0.44–1.00)
GFR, Estimated: 56 mL/min — ABNORMAL LOW (ref >60)
Glucose, Bld: 151 mg/dL — ABNORMAL HIGH (ref 70–99)
Potassium: 3.1 mmol/L — ABNORMAL LOW (ref 3.5–5.1)
Sodium: 148 mmol/L — ABNORMAL HIGH (ref 135–145)
Total Bilirubin: 1 mg/dL (ref 0.3–1.2)
Total Protein: 6.1 g/dL — ABNORMAL LOW (ref 6.5–8.1)

## 2020-06-24 LAB — MAGNESIUM: Magnesium: 2.1 mg/dL (ref 1.7–2.4)

## 2020-06-24 LAB — BRAIN NATRIURETIC PEPTIDE: B Natriuretic Peptide: 179.9 pg/mL — ABNORMAL HIGH (ref 0.0–100.0)

## 2020-06-24 MED ORDER — LACTATED RINGERS IV SOLN: INTRAVENOUS | Status: DC

## 2020-06-24 MED ORDER — HYDRALAZINE HCL 20 MG/ML IJ SOLN: 10.0000 mg | Freq: Four times a day (QID) | INTRAMUSCULAR | Status: DC | PRN

## 2020-06-24 MED ORDER — CHLORHEXIDINE GLUCONATE 0.12 % MT SOLN
15.0000 mL | Freq: Two times a day (BID) | OROMUCOSAL | Status: DC
  Administered 2020-06-24 – 2020-06-27 (×7): 15 mL via OROMUCOSAL
  Filled 2020-06-24 (×7): qty 15

## 2020-06-24 MED ORDER — POTASSIUM CHLORIDE 10 MEQ/100ML IV SOLN
10.0000 meq | INTRAVENOUS | Status: AC
  Administered 2020-06-24 (×4): 10 meq via INTRAVENOUS
  Filled 2020-06-24 (×4): qty 100

## 2020-06-24 MED ORDER — ORAL CARE MOUTH RINSE
15.0000 mL | Freq: Two times a day (BID) | OROMUCOSAL | Status: DC
  Administered 2020-06-24 – 2020-06-27 (×8): 15 mL via OROMUCOSAL

## 2020-06-24 MED ORDER — POTASSIUM CHLORIDE 2 MEQ/ML IV SOLN
INTRAVENOUS | Status: DC
  Filled 2020-06-24 (×3): qty 1000

## 2020-06-24 NOTE — Evaluation (Signed)
Occupational Therapy Evaluation Patient Details Name: Natalie Spencer MRN: [id number] DOB: 1927-01-01 Today's Date: 06/24/2020    History of Present Illness Pt is a 87 y/o female admitted secondary to nausea and vomitting, found to have mesenteric axial volvulus of the stomach now s/p NGT insertion. PMH including but not limited to advanced dementia, HTN and CVA.   Clinical Impression   Upon OT arrival, pt sitting EOB with PT. Pt pleasantly confused and cooperative throughout today's session. Pt declines any pain and frequently stating it is "good and well" throughout the session. Unable to determine prior level of functioning as pt with hx of dementia and no caregiver/family available to provide information. Pt tolerated sitting EOB with minguard-modA for stability while engaging in grooming. She required maxA for partial sit<>stand transfer and maxA for return to supine. Recommend d/c to SNF for follow-up therapy services. Pt will continue to benefit from skilled OT services to maximize safety and independence with ADL/IADL and functional mobility. Will continue to follow acutely and progress as tolerated.      Follow Up Recommendations  SNF;Supervision/Assistance - 24 hour    Equipment Recommendations       Recommendations for Other Services       Precautions / Restrictions Precautions Precautions: Fall Precaution Comments: NGT Restrictions Weight Bearing Restrictions: No      Mobility Bed Mobility Overal bed mobility: Needs Assistance Bed Mobility: Supine to Sit;Sit to Sidelying     Supine to sit: Mod assist   Sit to sidelying: +2 for physical assistance;Max assist General bed mobility comments: maximal verbal and tactile cueing for sequencing, pt able to initiate bilateral LEs off of bed, heavy physical assistance needed for trunk management and to return LEs onto bed    Transfers Overall transfer level: Needs assistance Equipment used: 2 person hand held  assist Transfers: Sit to/from Stand Sit to Stand: Max assist;+2 physical assistance         General transfer comment: increased time and effort required, verbal and tactile cueing for safe hand placement, face-to-face technique with use of gait belt and bed pads, max A x2 needed to complete a partial stand; unable to achieve upright standing position    Balance Overall balance assessment: Needs assistance Sitting-balance support: Feet supported Sitting balance-Leahy Scale: Poor Sitting balance - Comments: fluctuated between needing close min guard to mod A to maintain upright sitting at EOB                                   ADL either performed or assessed with clinical judgement   ADL Overall ADL's : Needs assistance/impaired     Grooming: Minimal assistance;Sitting Grooming Details (indicate cue type and reason): combed hair while sitting EOB, mod cues for initiation, minA for thorough completion Upper Body Bathing: Moderate assistance;Sitting   Lower Body Bathing: Moderate assistance;Sitting/lateral leans   Upper Body Dressing : Moderate assistance;Sitting   Lower Body Dressing: Moderate assistance;Sitting/lateral leans   Toilet Transfer: Total assistance   Toileting- Clothing Manipulation and Hygiene: Total assistance         General ADL Comments: pt tolerated sitting EOB for ~41min for completion of grooming task; pt limited by cognition and weakness     Vision         Perception     Praxis      Pertinent Vitals/Pain Pain Assessment: No/denies pain     Hand Dominance     Extremity/Trunk Assessment  Upper Extremity Assessment Upper Extremity Assessment: Difficult to assess due to impaired cognition (using RUE functionallly)   Lower Extremity Assessment Lower Extremity Assessment: Difficult to assess due to impaired cognition   Cervical / Trunk Assessment Cervical / Trunk Assessment: Kyphotic   Communication  Communication Communication: No difficulties   Cognition Arousal/Alertness: Awake/alert Behavior During Therapy: Restless Overall Cognitive Status: History of cognitive impairments - at baseline Area of Impairment: Orientation;Attention;Memory;Following commands;Safety/judgement;Awareness;Problem solving                 Orientation Level: Disoriented to;Place;Time;Situation Current Attention Level: Focused Memory: Decreased short-term memory Following Commands: Follows one step commands inconsistently;Follows one step commands with increased time Safety/Judgement: Decreased awareness of deficits;Decreased awareness of safety Awareness: Intellectual Problem Solving: Slow processing;Decreased initiation;Difficulty sequencing;Requires tactile cues;Requires verbal cues General Comments: pt with dementia at baseline, no family/caregivers present   General Comments  VSS    Exercises     Shoulder Instructions      Home Living Family/patient expects to be discharged to:: Unsure                                 Additional Comments: pt with hx of dementia and unable to provide any reliable information; no family/caregiver present during session (just hospital sitter)      Prior Functioning/Environment          Comments: Unsure - pt with hx of dementia and unable to provide any reliable information; no family/caregiver present during session (just hospital sitter)        OT Problem List: Decreased activity tolerance;Decreased strength;Impaired balance (sitting and/or standing);Decreased safety awareness;Decreased cognition;Decreased knowledge of use of DME or AE;Decreased knowledge of precautions      OT Treatment/Interventions: Self-care/ADL training;Therapeutic exercise;DME and/or AE instruction;Therapeutic activities;Patient/family education;Balance training    OT Goals(Current goals can be found in the care plan section) Acute Rehab OT Goals Patient Stated  Goal: unable to state OT Goal Formulation: Patient unable to participate in goal setting Time For Goal Achievement: 07/08/20 Potential to Achieve Goals: Fair ADL Goals Pt Will Perform Eating: with set-up;sitting Pt Will Perform Grooming: sitting;with min guard assist Pt Will Transfer to Toilet: with mod assist;with +2 assist;squat pivot transfer  OT Frequency: Min 2X/week   Barriers to D/C:            Co-evaluation PT/OT/SLP Co-Evaluation/Treatment: Yes Reason for Co-Treatment: For patient/therapist safety;To address functional/ADL transfers PT goals addressed during session: Mobility/safety with mobility;Balance;Strengthening/ROM OT goals addressed during session: ADL's and self-care      AM-PAC OT "6 Clicks" Daily Activity     Outcome Measure Help from another person eating meals?: Total Help from another person taking care of personal grooming?: A Little Help from another person toileting, which includes using toliet, bedpan, or urinal?: Total Help from another person bathing (including washing, rinsing, drying)?: A Lot Help from another person to put on and taking off regular upper body clothing?: A Lot Help from another person to put on and taking off regular lower body clothing?: Total 6 Click Score: 10   End of Session Nurse Communication: Mobility status  Activity Tolerance: Patient tolerated treatment well Patient left: in bed;with call bell/phone within reach;with bed alarm set;with nursing/sitter in room  OT Visit Diagnosis: Other abnormalities of gait and mobility (R26.89);Muscle weakness (generalized) (M62.81);Other symptoms and signs involving cognitive function                Time: 1346-1400  OT Time Calculation (min): 14 min Charges:  OT General Charges $OT Visit: 1 Visit OT Evaluation $OT Eval Moderate Complexity: 1 Mod  Demaree Liberto OTR/L Acute Rehabilitation Services Office: [phone]   Rebeca Alert 06/24/2020, 2:44 PM

## 2020-06-24 NOTE — Progress Notes (Addendum)
Patient ID: Natalie Spencer, female   DOB: 02/10/1926, 87 y.o.   MRN: [id number]    Progress Note   Subjective    Day # 2  CC; Nausea/ vomiting   EGD yesterday - gastric volvulus present, despite multiple attempts with the altered anatomy unable to advance to the pylorus and duodenum, NG placed  Labs today, WBC 9.5, hemoglobin 12.5 Potassium 3.1/creatinine 0.9  NG output about 100 cc today thus  far  Patient resting comfortably, sleeping sitter at bedside Palliative care has initiated contact us again   Objective   Vital signs in last 24 hours: Temp:  [97.3 F (36.3 C)-98.6 F (37 C)] 98 F (36.7 C) (06/12 0531) Pulse Rate:  [80-127] 99 (06/12 0531) Resp:  [16-34] 20 (06/12 0531) BP: (130-182)/(73-101) 148/91 (06/12 0531) SpO2:  [92 %-99 %] 93 % (06/12 0531)   General: Very elderly white female in NAD Heart:  Regular rate and rhythm; no murmurs Lungs: Respirations even and unlabored, lungs CTA bilaterally Abdomen:  Soft, nontender and nondistended. Normal bowel sounds.  NG in place Extremities:  Without edema. Neurologic:  Alert and oriented,  grossly normal neurologically. Psych:  Cooperative. Normal mood and affect.  Intake/Output from previous day: 06/11 0701 - 06/12 0700 In: 1552.8 [I.V.:1181.8; IV Piggyback:370.9] Out: 200 [Urine:200] Intake/Output this shift: Total I/O In: -  Out: 50 [Emesis/NG output:50]  Lab Results: Recent Labs    06/22/20 1506 06/23/20 0423 06/24/20 0138  WBC 13.8* 11.2* 9.5  HGB 14.7 16.1* 12.5  HCT 46.2* 51.0* 40.2  PLT 413* 371 318   BMET Recent Labs    06/22/20 1506 06/23/20 0423 06/24/20 0138  NA 143 146* 148*  K 2.9* 3.2* 3.1*  CL 101 104 113*  CO2 26 25 28   GLUCOSE 145* 181* 151*  BUN 33* 33* 30*  CREATININE 1.03* 1.03* 0.95  CALCIUM 10.2 10.3 9.3   LFT Recent Labs    06/24/20 0138  PROT 6.1*  ALBUMIN 2.6*  AST 19  ALT 12  ALKPHOS 47  BILITOT 1.0   PT/INR No results for input(s): LABPROT, INR in the last  72 hours.  Studies/Results: CT Abdomen Pelvis Wo Contrast  Result Date: 06/22/2020 CLINICAL DATA:  Abdominal pain and vomiting. EXAM: CT ABDOMEN AND PELVIS WITHOUT CONTRAST TECHNIQUE: Multidetector CT imaging of the abdomen and pelvis was performed following the standard protocol without IV contrast. COMPARISON:  CT abdomen pelvis dated May 09, 2020. FINDINGS: Lower chest: New consolidation in the left lower lobe, incompletely visualized. Hepatobiliary: No focal liver abnormality is seen. Status post cholecystectomy. No biliary dilatation. Pancreas: Atrophic. No ductal dilatation or surrounding inflammatory changes. Spleen: Normal in size without focal abnormality. Adrenals/Urinary Tract: Adrenal glands are unremarkable. No renal calculi or hydronephrosis. The bladder is unremarkable. 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 The stomach and lower esophagus are distended with fluid. No wall thickening or pneumatosis. No small bowel obstruction. Extensive sigmoid colonic diverticulosis again noted. Normal appendix. Vascular/Lymphatic: Aortic atherosclerosis. No enlarged abdominal or pelvic lymph nodes. Reproductive: Uterus and bilateral adnexa are unremarkable. Other: Unchanged tiny fat containing umbilical hernia. No free fluid or pneumoperitoneum. Musculoskeletal: No acute or significant osseous findings. IMPRESSION: 1. 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 The stomach and lower esophagus are distended with fluid, suggestive of low-grade obstruction. This is unchanged in appearance from the prior scan. 2. New consolidation in the left lower lobe, incompletely visualized, concerning for pneumonia. 3.  Aortic Atherosclerosis (ICD10-I70.0). Electronically Signed   By: Obie Dredge M.D.   On: 06/22/2020 21:15   DG Abd 1 View  Result Date: 06/24/2020 CLINICAL DATA:   Nausea and vomiting 1 day. EXAM: ABDOMEN - 1 VIEW COMPARISON:  06/22/2020 FINDINGS: Nasogastric tube has tip over the stomach in the left upper quadrant. Bowel gas pattern is nonobstructive. No free peritoneal air. Single surgical clip over the right lower abdomen. Surgical clips over the right upper quadrant. There are degenerative changes of the spine. Degenerative change of the hips right worse than left. IMPRESSION: 1. Nonobstructive bowel gas pattern. 2. Nasogastric tube with tip over the stomach in the left upper quadrant. Electronically Signed   By: Elberta Fortis M.D.   On: 06/24/2020 08:04   DG Abd Acute W/Chest  Result Date: 06/22/2020 CLINICAL DATA:  Vomiting today. EXAM: DG ABDOMEN ACUTE WITH 1 VIEW CHEST COMPARISON:  CT abdomen pelvis dated May 09, 2020. Chest x-ray dated February 08, 2019. FINDINGS: There is no evidence of dilated bowel loops or free intraperitoneal air. Paucity of small bowel gas. Air and stool are seen in the colon. Unchanged paraesophageal hiatal hernia. No radiopaque calculi or other significant radiographic abnormality is seen. Normal heart size. New patchy opacity in the peripheral left mid lung. Possible small left pleural effusion. The right lung is clear. No pneumothorax. No acute osseous abnormality. IMPRESSION: 1. New patchy opacity in the peripheral left mid lung, suspicious for pneumonia. 2. Possible small left pleural effusion. 3. Unchanged paraesophageal hiatal hernia.  No bowel obstruction. Electronically Signed   By: Obie Dredge M.D.   On: 06/22/2020 19:26       Assessment / Plan:    #22 87 year old white female with recurrent nausea and vomiting with findings of paraesophageal hernia and gastric volvulus on CT  EGD yesterday confirmed same, inability to traverse into the pylorus  NG has been placed  Continue decompression today, plan for Gastrografin upper GI tomorrow Not felt to be a surgical candidate Palliative care is consulting Consider  review after further NG decompression  #2 possible pneumonia-on antibiotic #3 acute kidney injury-stable #4 hypokalemia continue correction per primary service    LOS: 2 days   Amy Esterwood  06/24/2020, 9:22 AM     Attending Physician Note   I have taken an interval history, reviewed the chart and examined the patient. I agree with the Advanced Practitioner's note, impression and recommendations.   *Paraesophageal hernia, gastric volvulus. Stomach suctioned, decompressed and NGT placed at EGD however unable to advance to the pylorus or duodenum. Unable to reduce the gastric volvulus due to altered anatomy. Pt not felt to be a surgical candidate. Palliative care is consulting. Consider UGI series tomorrow with gastrografin per NGT if further evaluation is felt to be necessary.  *Suspected aspiration PNA *Dementia *Hypokalemia  Claudette Head, MD [facility] [phone]

## 2020-06-24 NOTE — Progress Notes (Signed)
NG inserted via EGD yesterday by GI.  Minimal output recorded.  Palliative goals of care reviewed.  Patient is not really a surgical candidate for any procedure.  We have no further recommendations if NG fails to improve her condition.  Will see PRN going forward.    Vanita Panda, MD  Colorectal and General Surgery [facility] Surgery

## 2020-06-24 NOTE — Evaluation (Signed)
Physical Therapy Evaluation Patient Details Name: Natalie Spencer MRN: [id number] DOB: [birth year] Today's Date: 06/24/2020   History of Present Illness  Pt is a 87 y/o female admitted secondary to nausea and vomitting, found to have mesenteric axial volvulus of the stomach now s/p NGT insertion. PMH including but not limited to advanced dementia, HTN and CVA.  Clinical Impression  Pt presented supine in bed with HOB elevated, awake and willing to participate in therapy session. Pt was pleasantly confused throughout, repeatedly stating "I love you" and "it is well and good". Pt with no signs of pain/discomfort throughout. Unable to obtain any clear history information as pt with dementia at baseline and no family/caregivers present throughout the evaluation. Pt required mod-max A x2 for bed mobility and max A x2 to perform a partial sit<>stand transfer. Based on pt's current functional mobility status and unknown family support, PT recommending pt d/c to a SNF for further intensive therapy services to maximize her independence with functional mobility. Pt would continue to benefit from skilled physical therapy services at this time while admitted and after d/c to address the below listed limitations in order to improve overall safety and independence with functional mobility.     Follow Up Recommendations SNF    Equipment Recommendations  None recommended by PT    Recommendations for Other Services       Precautions / Restrictions Precautions Precautions: Fall Precaution Comments: NGT Restrictions Weight Bearing Restrictions: No      Mobility  Bed Mobility Overal bed mobility: Needs Assistance Bed Mobility: Supine to Sit;Sit to Sidelying     Supine to sit: Mod assist   Sit to sidelying: +2 for physical assistance;Max assist General bed mobility comments: maximal verbal and tactile cueing for sequencing, pt able to initiate bilateral LEs off of bed, heavy physical assistance needed for  trunk management and to return LEs onto bed    Transfers Overall transfer level: Needs assistance Equipment used: 2 person hand held assist Transfers: Sit to/from Stand Sit to Stand: Max assist;+2 physical assistance         General transfer comment: increased time and effort required, verbal and tactile cueing for safe hand placement, face-to-face technique with use of gait belt and bed pads, max A x2 needed to complete a partial stand; unable to achieve upright standing position  Ambulation/Gait             General Gait Details: unable  Stairs            Wheelchair Mobility    Modified Rankin (Stroke Patients Only)       Balance Overall balance assessment: Needs assistance Sitting-balance support: Feet supported Sitting balance-Leahy Scale: Poor Sitting balance - Comments: fluctuated between needing close min guard to mod A to maintain upright sitting at EOB                                     Pertinent Vitals/Pain Pain Assessment: No/denies pain    Home Living Family/patient expects to be discharged to:: Unsure                 Additional Comments: pt with hx of dementia and unable to provide any reliable information; no family/caregiver present during session (just hospital sitter)    Prior Function           Comments: Unsure - pt with hx of dementia and unable to provide any reliable  information; no family/caregiver present during session (just hospital sitter)     Hand Dominance        Extremity/Trunk Assessment   Upper Extremity Assessment Upper Extremity Assessment: Difficult to assess due to impaired cognition;Defer to OT evaluation    Lower Extremity Assessment Lower Extremity Assessment: Difficult to assess due to impaired cognition    Cervical / Trunk Assessment Cervical / Trunk Assessment: Kyphotic  Communication   Communication: No difficulties  Cognition Arousal/Alertness: Awake/alert Behavior During  Therapy: Restless Overall Cognitive Status: History of cognitive impairments - at baseline Area of Impairment: Orientation;Attention;Memory;Following commands;Safety/judgement;Awareness;Problem solving                 Orientation Level: Disoriented to;Place;Time;Situation Current Attention Level: Focused Memory: Decreased short-term memory Following Commands: Follows one step commands inconsistently;Follows one step commands with increased time Safety/Judgement: Decreased awareness of deficits;Decreased awareness of safety Awareness: Intellectual Problem Solving: Slow processing;Decreased initiation;Difficulty sequencing;Requires tactile cues;Requires verbal cues General Comments: pt with dementia at baseline, no family/caregivers present      General Comments      Exercises     Assessment/Plan    PT Assessment Patient needs continued PT services  PT Problem List Decreased strength;Decreased activity tolerance;Decreased balance;Decreased mobility;Decreased coordination;Decreased cognition;Decreased knowledge of use of DME;Decreased safety awareness;Decreased knowledge of precautions       PT Treatment Interventions DME instruction;Gait training;Functional mobility training;Therapeutic activities;Therapeutic exercise;Balance training;Neuromuscular re-education;Cognitive remediation;Patient/family education;Wheelchair mobility training    PT Goals (Current goals can be found in the Care Plan section)  Acute Rehab PT Goals Patient Stated Goal: unable to state PT Goal Formulation: Patient unable to participate in goal setting Time For Goal Achievement: 07/08/20 Potential to Achieve Goals: Fair    Frequency Min 2X/week   Barriers to discharge        Co-evaluation PT/OT/SLP Co-Evaluation/Treatment: Yes Reason for Co-Treatment: For patient/therapist safety;To address functional/ADL transfers PT goals addressed during session: Mobility/safety with  mobility;Balance;Strengthening/ROM         AM-PAC PT "6 Clicks" Mobility  Outcome Measure Help needed turning from your back to your side while in a flat bed without using bedrails?: A Lot Help needed moving from lying on your back to sitting on the side of a flat bed without using bedrails?: A Lot Help needed moving to and from a bed to a chair (including a wheelchair)?: Total Help needed standing up from a chair using your arms (e.g., wheelchair or bedside chair)?: Total Help needed to walk in hospital room?: Total Help needed climbing 3-5 steps with a railing? : Total 6 Click Score: 8    End of Session Equipment Utilized During Treatment: Gait belt Activity Tolerance: Patient tolerated treatment well Patient left: in bed;with call bell/phone within reach;with bed alarm set;with nursing/sitter in room;Other (comment) (bilateral hand mittens donned) Nurse Communication: Mobility status PT Visit Diagnosis: Muscle weakness (generalized) (M62.81)    Time: 6433-2951 PT Time Calculation (min) (ACUTE ONLY): 29 min   Charges:   PT Evaluation $PT Eval Moderate Complexity: 1 Mod          Ginette Pitman, PT, DPT  Acute Rehabilitation Services Pager [phone] Office [phone]   Alessandra Bevels Caci Orren 06/24/2020, 2:18 PM

## 2020-06-24 NOTE — Progress Notes (Signed)
   Palliative Medicine Inpatient Follow Up Note  Reason for consult:  Goals of Care   HPI:  Per intake H&P --> Natalie Spencer is a 87 y.o. female with history of advanced dementia was brought to the ER after patient had nausea vomiting.  Its not sure how long patient has been having vomiting.  Patient was admitted about 2 months ago for similar situation at that time EGD showed esophagitis.   Palliative care has been asked to get involved to discuss goals of care in the setting of advanced dementia and recurrent gastric volvulus.   Today's Discussion (06/24/2020):  *Please note that this is a verbal dictation therefore any spelling or grammatical errors are due to the "[employer]agon [employer] One" system interpretation.  Chart reviewed.  S/P EGD w/ gastric decompression NGT was placed under endoscopic guidance.   Per conversation with RN, Carlee as significant amount of gastric contents were removed yesterday with the procedure.   Upon exam this morning Natalie Spencer remains pleasantly altered. She is more conversant this morning and is able to share with me her name only. Her abdomen is less distended. No longer belching or vomiting. Did require a dose of morphine this morning for generalized discomfort.   Plan remains to continue conservative measures to improve patients condition. If for any reason she should decline we would then transition emphasis to comfort.   Questions and concerns addressed   Objective Assessment: Vital Signs Vitals:   06/24/20 0000 06/24/20 0531  BP: (!) 147/84 (!) 148/91  Pulse: 100 99  Resp: 18 20  Temp:  98 F (36.7 C)  SpO2: 96% 93%    Intake/Output Summary (Last 24 hours) at 06/24/2020 0912 Last data filed at 06/24/2020 0093 Gross per 24 hour  Intake 1552.76 ml  Output 250 ml  Net 1302.76 ml   Gen:  Chronically ill appearing caucasian F HEENT: moist mucous membranes CV: Irregular rate and rhythm PULM: On 3LPM [place] ABD: Soft, Nontender EXT: No edema Neuro:  Disoriented  Decision Maker: Natalie Spencer (Legal Guardian) - [phone]   SUMMARY OF RECOMMENDATIONS   DNAR/DNI   Ideally goal would be for improvement   If not improvements occur plan for comfort oriented care   Ongoing incremental PMT support  Time Spent: 25 Greater than 50% of the time was spent in counseling and coordination of care ______________________________________________________________________________________ Natalie Spencer Natalie Spencer Palliative Medicine Team Team Cell Phone: [phone] Please utilize secure chat with additional questions, if there is no response within 30 minutes please call the above phone number  Palliative Medicine Team providers are available by phone from 7am to 7pm daily and can be reached through the team cell phone.  Should this patient require assistance outside of these hours, please call the patient's attending physician.

## 2020-06-24 NOTE — Progress Notes (Signed)
PROGRESS NOTE                                                                                                                                                                                                             Patient Demographics:    Natalie Spencer, is a 87 y.o. female, DOB - [birth year], KCL:[id number]  Outpatient Primary MD for the patient is Patient, No Pcp Per (Inactive)    LOS - 2  Admit date - 06/22/2020    Chief Complaint  Patient presents with   Emesis       Brief Narrative (HPI from H&P)  - Natalie Spencer is a 87 y.o. female with history of advanced dementia was brought to the ER after patient had nausea vomiting.  Its not sure how long patient has been having vomiting.  Patient was admitted about 2 months ago for similar situation at that time EGD showed esophagitis.  Work-up in the ER at this time showed that patient had gastric volvulus with obstruction, GI general surgery were consulted and she was admitted for further treatment.   Subjective:   Patient in bed somnolent unable to answer questions or follow commands but appears to be comfortable   Assessment  & Plan :     Nausea vomiting with CT scan showing mesenteric axial volvulus of the stomach with possible obstruction - she remains n.p.o., GI and general surgery on board, underwent EGD with NG tube placement with EGD on 06/23/2020, scope could not be advanced much beyond the esophagus, general surgery deems patient to be a poor candidate which I agree with, for now abdomen appears to be soft, NG on intermittent suction, palliative care on board, continue to monitor in hopes that the volvulus will reverse, if declines then comfort measures. Possible pneumonia for which patient is on empiric antibiotics. Hypokalemia likely from vomiting and NG suction.  Replaced we will continue to monitor. History of hypertension we will keep patient on as needed IV labetalol  and add Catapres patch. History of dementia. Acute renal failure from vomiting hydrate and recheck metabolic panel. Possible GI bleed per we will check serial CBCs.  On IV Protonix.      Condition - Extremely Guarded  Family Communication  :  POA Eliot Ford [phone] - on 06/23/20  Code Status :  DNR  Consults  :  GI, CCS  PUD Prophylaxis : PPI   Procedures  :            Disposition Plan  :    Status is: Inpatient  Remains inpatient appropriate because:IV treatments appropriate due to intensity of illness or inability to take PO  Dispo: The patient is from: SNF              Anticipated d/c is to: SNF              Patient currently is not medically stable to d/c.   Difficult to place patient No  DVT Prophylaxis  :    SCDs Start: 06/22/20 2307   Lab Results  Component Value Date   PLT 318 06/24/2020    Diet :  Diet Order             Diet NPO time specified  Diet effective now                    Inpatient Medications  Scheduled Meds:  chlorhexidine  15 mL Mouth Rinse BID   cloNIDine  0.2 mg Transdermal Weekly   mouth rinse  15 mL Mouth Rinse q12n4p   pantoprazole (PROTONIX) IV  40 mg Intravenous Q12H   Continuous Infusions:  azithromycin Stopped (06/23/20 2343)   cefTRIAXone (ROCEPHIN)  IV Stopped (06/23/20 2226)   lactated ringers 75 mL/hr at 06/24/20 0852   potassium chloride 10 mEq (06/24/20 1129)   PRN Meds:.acetaminophen **OR** acetaminophen, hydrALAZINE, labetalol, morphine injection  Antibiotics  :    Anti-infectives (From admission, onward)    Start     Dose/Rate Route Frequency Ordered Stop   06/23/20 2200  cefTRIAXone (ROCEPHIN) 2 g in sodium chloride 0.9 % 100 mL IVPB        2 g 200 mL/hr over 30 Minutes Intravenous Every 24 hours 06/22/20 2308 06/28/20 2159   06/23/20 2200  azithromycin (ZITHROMAX) 500 mg in sodium chloride 0.9 % 250 mL IVPB        500 mg 250 mL/hr over 60 Minutes Intravenous Every 24 hours  06/22/20 2308 06/28/20 2159   06/22/20 2045  azithromycin (ZITHROMAX) 500 mg in sodium chloride 0.9 % 250 mL IVPB        500 mg 250 mL/hr over 60 Minutes Intravenous  Once 06/22/20 2039 06/22/20 2330   06/22/20 2045  cefTRIAXone (ROCEPHIN) 1 g in sodium chloride 0.9 % 100 mL IVPB        1 g 200 mL/hr over 30 Minutes Intravenous  Once 06/22/20 2039 06/22/20 2204        Time Spent in minutes  30   Susa RaringPrashant Corrado Hymon M.D on 06/24/2020 at 11:36 AM  To page go to [email or web address]   [facility] -  Office  [phone]   See all Orders from today for further details    Objective:   Vitals:   06/23/20 1909 06/23/20 2139 06/24/20 0000 06/24/20 0531  BP:  130/73 (!) 147/84 (!) 148/91  Pulse: 95 91 100 99  Resp: 19 17 18 20   Temp:  (!) 97.4 F (36.3 C)  98 F (36.7 C)  TempSrc:  Axillary  Axillary  SpO2: 97% 97% 96% 93%    Wt Readings from Last 3 Encounters:  05/08/20 54.4 kg     Intake/Output Summary (Last 24 hours) at 06/24/2020 1136 Last data filed at 06/24/2020 0856 Gross per 24 hour  Intake 1552.76 ml  Output 250 ml  Net 1302.76 ml  Physical Exam  Somnolent but appears to be in no distress, NG tube in place, [place].AT,PERRAL Supple Neck,No JVD, No cervical lymphadenopathy appriciated.  Symmetrical Chest wall movement, Good air movement bilaterally, CTAB RRR,No Gallops, Rubs or new Murmurs, No Parasternal Heave +ve B.Sounds, Abd Soft, No tenderness, No organomegaly appriciated, No rebound - guarding or rigidity. No Cyanosis, Clubbing or edema, No new Rash or bruise   RN pressure injury documentation:     Data Review:    CBC Recent Labs  Lab 06/22/20 1506 06/23/20 0423 06/24/20 0138  WBC 13.8* 11.2* 9.5  HGB 14.7 16.1* 12.5  HCT 46.2* 51.0* 40.2  PLT 413* 371 318  MCV 88.5 88.5 89.9  MCH 28.2 28.0 28.0  MCHC 31.8 31.6 31.1  RDW 13.9 14.1 14.1  LYMPHSABS  --   --  1.6  MONOABS  --   --  0.7  EOSABS  --   --  0.0  BASOSABS  --   --  0.0     Recent Labs  Lab 06/22/20 1506 06/23/20 0423 06/24/20 0138  NA 143 146* 148*  K 2.9* 3.2* 3.1*  CL 101 104 113*  CO2 26 25 28   GLUCOSE 145* 181* 151*  BUN 33* 33* 30*  CREATININE 1.03* 1.03* 0.95  CALCIUM 10.2 10.3 9.3  AST 21  --  19  ALT 11  --  12  ALKPHOS 69  --  47  BILITOT 2.0*  --  1.0  ALBUMIN 3.8  --  2.6*  MG  --   --  2.1  BNP  --   --  179.9*    ------------------------------------------------------------------------------------------------------------------ No results for input(s): CHOL, HDL, LDLCALC, TRIG, CHOLHDL, LDLDIRECT in the last 72 hours.  No results found for: HGBA1C ------------------------------------------------------------------------------------------------------------------ No results for input(s): TSH, T4TOTAL, T3FREE, THYROIDAB in the last 72 hours.  Invalid input(s): FREET3  Cardiac Enzymes No results for input(s): CKMB, TROPONINI, MYOGLOBIN in the last 168 hours.  Invalid input(s): CK ------------------------------------------------------------------------------------------------------------------    Component Value Date/Time   BNP 179.9 (H) 06/24/2020 0138    Micro Results Recent Results (from the past 240 hour(s))  Resp Panel by RT-PCR (Flu A&B, Covid) Nasopharyngeal Swab     Status: None   Collection Time: 06/23/20 12:30 AM   Specimen: Nasopharyngeal Swab; Nasopharyngeal(NP) swabs in vial transport medium  Result Value Ref Range Status   SARS Coronavirus 2 by RT PCR NEGATIVE NEGATIVE Final    Comment: (NOTE) SARS-CoV-2 target nucleic acids are NOT DETECTED.  The SARS-CoV-2 RNA is generally detectable in upper respiratory specimens during the acute phase of infection. The lowest concentration of SARS-CoV-2 viral copies this assay can detect is 138 copies/mL. A negative result does not preclude SARS-Cov-2 infection and should not be used as the sole basis for treatment or other patient management decisions. A negative  result may occur with  improper specimen collection/handling, submission of specimen other than nasopharyngeal swab, presence of viral mutation(s) within the areas targeted by this assay, and inadequate number of viral copies(<138 copies/mL). A negative result must be combined with clinical observations, patient history, and epidemiological information. The expected result is Negative.  Fact Sheet for Patients:  08/23/20  Fact Sheet for Healthcare Providers:  BloggerCourse.com  This test is no t yet approved or cleared by the [place] FDA and  has been authorized for detection and/or diagnosis of SARS-CoV-2 by FDA under an Emergency Use Authorization (EUA). This EUA will remain  in effect (meaning this test can be used) for  the duration of the COVID-19 declaration under Section 564(b)(1) of the Act, 21 U.S.C.section 360bbb-3(b)(1), unless the authorization is terminated  or revoked sooner.       Influenza A by PCR NEGATIVE NEGATIVE Final   Influenza B by PCR NEGATIVE NEGATIVE Final    Comment: (NOTE) The Xpert Xpress SARS-CoV-2/FLU/RSV plus assay is intended as an aid in the diagnosis of influenza from Nasopharyngeal swab specimens and should not be used as a sole basis for treatment. Nasal washings and aspirates are unacceptable for Xpert Xpress SARS-CoV-2/FLU/RSV testing.  Fact Sheet for Patients: BloggerCourse.com  Fact Sheet for Healthcare Providers: SeriousBroker.it  This test is not yet approved or cleared by the [place] FDA and has been authorized for detection and/or diagnosis of SARS-CoV-2 by FDA under an Emergency Use Authorization (EUA). This EUA will remain in effect (meaning this test can be used) for the duration of the COVID-19 declaration under Section 564(b)(1) of the Act, 21 U.S.C. section 360bbb-3(b)(1), unless the authorization is  terminated or revoked.  Performed at [facility], [address] [place], [place] [address]     Radiology Reports CT Abdomen Pelvis Wo Contrast  Result Date: 06/22/2020 CLINICAL DATA:  Abdominal pain and vomiting. EXAM: CT ABDOMEN AND PELVIS WITHOUT CONTRAST TECHNIQUE: Multidetector CT imaging of the abdomen and pelvis was performed following the standard protocol without IV contrast. COMPARISON:  CT abdomen pelvis dated May 09, 2020. FINDINGS: Lower chest: New consolidation in the left lower lobe, incompletely visualized. Hepatobiliary: No focal liver abnormality is seen. Status post cholecystectomy. No biliary dilatation. Pancreas: Atrophic. No ductal dilatation or surrounding inflammatory changes. Spleen: Normal in size without focal abnormality. Adrenals/Urinary Tract: Adrenal glands are unremarkable. No renal calculi or hydronephrosis. The bladder is unremarkable. 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 The stomach and lower esophagus are distended with fluid. No wall thickening or pneumatosis. No small bowel obstruction. Extensive sigmoid colonic diverticulosis again noted. Normal appendix. Vascular/Lymphatic: Aortic atherosclerosis. No enlarged abdominal or pelvic lymph nodes. Reproductive: Uterus and bilateral adnexa are unremarkable. Other: Unchanged tiny fat containing umbilical hernia. No free fluid or pneumoperitoneum. Musculoskeletal: No acute or significant osseous findings. IMPRESSION: 1. 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 The stomach and lower esophagus are distended with fluid, suggestive of low-grade obstruction. This is unchanged in appearance from the prior scan. 2. New consolidation in the left lower lobe, incompletely visualized, concerning for pneumonia. 3. Aortic Atherosclerosis (ICD10-I70.0). Electronically Signed   By:  Obie Dredge M.D.   On: 06/22/2020 21:15   DG Abd 1 View  Result Date: 06/24/2020 CLINICAL DATA:  Nausea and vomiting 1 day. EXAM: ABDOMEN - 1 VIEW COMPARISON:  06/22/2020 FINDINGS: Nasogastric tube has tip over the stomach in the left upper quadrant. Bowel gas pattern is nonobstructive. No free peritoneal air. Single surgical clip over the right lower abdomen. Surgical clips over the right upper quadrant. There are degenerative changes of the spine. Degenerative change of the hips right worse than left. IMPRESSION: 1. Nonobstructive bowel gas pattern. 2. Nasogastric tube with tip over the stomach in the left upper quadrant. Electronically Signed   By: Elberta Fortis M.D.   On: 06/24/2020 08:04   DG Abd Acute W/Chest  Result Date: 06/22/2020 CLINICAL DATA:  Vomiting today. EXAM: DG ABDOMEN ACUTE WITH 1 VIEW CHEST COMPARISON:  CT abdomen pelvis dated May 09, 2020. Chest x-ray dated February 08, 2019. FINDINGS: There is no  evidence of dilated bowel loops or free intraperitoneal air. Paucity of small bowel gas. Air and stool are seen in the colon. Unchanged paraesophageal hiatal hernia. No radiopaque calculi or other significant radiographic abnormality is seen. Normal heart size. New patchy opacity in the peripheral left mid lung. Possible small left pleural effusion. The right lung is clear. No pneumothorax. No acute osseous abnormality. IMPRESSION: 1. New patchy opacity in the peripheral left mid lung, suspicious for pneumonia. 2. Possible small left pleural effusion. 3. Unchanged paraesophageal hiatal hernia.  No bowel obstruction. Electronically Signed   By: Obie Dredge M.D.   On: 06/22/2020 19:26

## 2020-06-25 ENCOUNTER — Inpatient Hospital Stay (HOSPITAL_COMMUNITY): Payer: Medicare Other

## 2020-06-25 ENCOUNTER — Encounter (HOSPITAL_COMMUNITY): Payer: Self-pay | Admitting: Gastroenterology

## 2020-06-25 DIAGNOSIS — R1084 Generalized abdominal pain: Secondary | ICD-10-CM

## 2020-06-25 DIAGNOSIS — R112 Nausea with vomiting, unspecified: Secondary | ICD-10-CM

## 2020-06-25 DIAGNOSIS — R933 Abnormal findings on diagnostic imaging of other parts of digestive tract: Secondary | ICD-10-CM

## 2020-06-25 DIAGNOSIS — K3189 Other diseases of stomach and duodenum: Principal | ICD-10-CM

## 2020-06-25 LAB — COMPREHENSIVE METABOLIC PANEL
ALT: 9 U/L (ref 0–44)
AST: 16 U/L (ref 15–41)
Albumin: 2.4 g/dL — ABNORMAL LOW (ref 3.5–5.0)
Alkaline Phosphatase: 47 U/L (ref 38–126)
Anion gap: 6 (ref 5–15)
BUN: 22 mg/dL (ref 8–23)
CO2: 32 mmol/L (ref 22–32)
Calcium: 9.1 mg/dL (ref 8.9–10.3)
Chloride: 113 mmol/L — ABNORMAL HIGH (ref 98–111)
Creatinine, Ser: 0.92 mg/dL (ref 0.44–1.00)
GFR, Estimated: 58 mL/min — ABNORMAL LOW (ref >60)
Glucose, Bld: 121 mg/dL — ABNORMAL HIGH (ref 70–99)
Potassium: 3.6 mmol/L (ref 3.5–5.1)
Sodium: 151 mmol/L — ABNORMAL HIGH (ref 135–145)
Total Bilirubin: 1.1 mg/dL (ref 0.3–1.2)
Total Protein: 5.7 g/dL — ABNORMAL LOW (ref 6.5–8.1)

## 2020-06-25 LAB — GLUCOSE, CAPILLARY
Glucose-Capillary: 107 mg/dL — ABNORMAL HIGH (ref 70–99)
Glucose-Capillary: 112 mg/dL — ABNORMAL HIGH (ref 70–99)
Glucose-Capillary: 120 mg/dL — ABNORMAL HIGH (ref 70–99)
Glucose-Capillary: 88 mg/dL (ref 70–99)

## 2020-06-25 LAB — CBC WITH DIFFERENTIAL/PLATELET
Abs Immature Granulocytes: 0.06 10*3/uL (ref 0.00–0.07)
Basophils Absolute: 0.1 10*3/uL (ref 0.0–0.1)
Basophils Relative: 1 %
Eosinophils Absolute: 0.3 10*3/uL (ref 0.0–0.5)
Eosinophils Relative: 3 %
HCT: 37.4 % (ref 36.0–46.0)
Hemoglobin: 11.5 g/dL — ABNORMAL LOW (ref 12.0–15.0)
Immature Granulocytes: 1 %
Lymphocytes Relative: 21 %
Lymphs Abs: 2.1 10*3/uL (ref 0.7–4.0)
MCH: 28.1 pg (ref 26.0–34.0)
MCHC: 30.7 g/dL (ref 30.0–36.0)
MCV: 91.4 fL (ref 80.0–100.0)
Monocytes Absolute: 0.7 10*3/uL (ref 0.1–1.0)
Monocytes Relative: 7 %
Neutro Abs: 6.6 10*3/uL (ref 1.7–7.7)
Neutrophils Relative %: 67 %
Platelets: 287 10*3/uL (ref 150–400)
RBC: 4.09 MIL/uL (ref 3.87–5.11)
RDW: 14 % (ref 11.5–15.5)
WBC: 9.7 10*3/uL (ref 4.0–10.5)
nRBC: 0 % (ref 0.0–0.2)

## 2020-06-25 LAB — BRAIN NATRIURETIC PEPTIDE: B Natriuretic Peptide: 350.5 pg/mL — ABNORMAL HIGH (ref 0.0–100.0)

## 2020-06-25 LAB — MAGNESIUM: Magnesium: 2.2 mg/dL (ref 1.7–2.4)

## 2020-06-25 MED ORDER — BISACODYL 10 MG RE SUPP
10.0000 mg | Freq: Once | RECTAL | Status: AC
  Administered 2020-06-25: 10 mg via RECTAL
  Filled 2020-06-25: qty 1

## 2020-06-25 MED ORDER — POTASSIUM CL IN DEXTROSE 5% 20 MEQ/L IV SOLN
20.0000 meq | INTRAVENOUS | Status: DC
  Administered 2020-06-25 – 2020-06-26 (×3): 20 meq via INTRAVENOUS
  Filled 2020-06-25 (×3): qty 1000

## 2020-06-25 NOTE — Progress Notes (Signed)
Patient managed to pull out NG tube despite having hand mittens applied. Patient is baseline dementia and disoriented to time, place, and situation. Oncall provider notified. Patient suctioned and mouth care performed. No n/v, or distress at this time. Will continue to monitor.

## 2020-06-25 NOTE — Progress Notes (Addendum)
   Palliative Medicine Inpatient Follow Up Note  Reason for consult:  Goals of Care   HPI:  Per intake H&P --> Natalie Spencer is a 87 y.o. female with history of advanced dementia was brought to the ER after patient had nausea vomiting.  Its not sure how long patient has been having vomiting.  Patient was admitted about 2 months ago for similar situation at that time EGD showed esophagitis.   Palliative care has been asked to get involved to discuss goals of care in the setting of advanced dementia and recurrent gastric volvulus.   Today's Discussion (06/25/2020):  *Please note that this is a verbal dictation therefore any spelling or grammatical errors are due to the "Irena One" system interpretation.  Chart reviewed.Patients dislodged her NGT overnight.   I met with Natalie Spencer this morning she was alert and oriented to self. Natalie Spencer was able to participate in getting out of the bed to the chair. She was in excellent spirits. She denies pain or nausea.   I had a conversation with patients HCPOA, Natalie Spencer, We reviewed that [facility] if very likely to endure another SBO moving forward and we should decide if sending her back and forth to the hospital is in her best interest or if rather we should consider hospice care at [facility]. Natalie Spencer shares that she doesn't wish for [facility] to endure going to and from the hospital and agrees that hospice is a very appropriate next step.I shared that TOC will reach out to her for further conversations.   Questions and concerns addressed   Objective Assessment: Vital Signs Vitals:   06/25/20 0000 06/25/20 0400  BP: 134/82 (!) 164/97  Pulse: 74 66  Resp: 16 16  Temp: 97.8 F (36.6 C) 98.7 F (37.1 C)  SpO2: 97% 94%    Intake/Output Summary (Last 24 hours) at 06/25/2020 1131 Last data filed at 06/25/2020 4193 Gross per 24 hour  Intake 1233.69 ml  Output --  Net 1233.69 ml    Gen:  Chronically ill appearing caucasian F HEENT: moist mucous membranes CV:  Irregular rate and rhythm PULM: On 3LPM [place] ABD: Soft, Nontender EXT: No edema Neuro: Disoriented  Decision Maker: Natalie Spencer (Legal Guardian) - [phone]   SUMMARY OF RECOMMENDATIONS   DNAR/DNI  Continue conservative measures  Plan for patient to transition back to [facility]   Ongoing incremental PMT support  Time Spent: 35 Greater than 50% of the time was spent in counseling and coordination of care ______________________________________________________________________________________ [place] Team Team Cell Phone: [phone] Please utilize secure chat with additional questions, if there is no response within 30 minutes please call the above phone number  Palliative Medicine Team providers are available by phone from 7am to 7pm daily and can be reached through the team cell phone.  Should this patient require assistance outside of these hours, please call the patient's attending physician.

## 2020-06-25 NOTE — Progress Notes (Signed)
PROGRESS NOTE                                                                                                                                                                                                             Patient Demographics:    Natalie Spencer, is a 87 y.o. female, DOB - [birth year], JAS:[id number]  Outpatient Primary MD for the patient is Patient, No Pcp Per (Inactive)    LOS - 3  Admit date - 06/22/2020    Chief Complaint  Patient presents with   Emesis       Brief Narrative (HPI from H&P)  - Natalie Spencer is a 87 y.o. female with history of advanced dementia was brought to the ER after patient had nausea vomiting.  Its not sure how long patient has been having vomiting.  Patient was admitted about 2 months ago for similar situation at that time EGD showed esophagitis.  Work-up in the ER at this time showed that patient had gastric volvulus with obstruction, GI general surgery were consulted and she was admitted for further treatment.   Subjective:   Patient in recliner confused and unable to answer questions or follow commands but appears to be in no distress.   Assessment  & Plan :     Nausea vomiting with CT scan showing mesenteric axial volvulus of the stomach with possible obstruction - she remains n.p.o., GI and general surgery on board, underwent EGD with NG tube placement with EGD on 06/23/2020, scope could not be advanced much beyond the esophagus, general surgery deems patient to be a poor candidate which I agree with, for now abdomen appears to be soft, unfortunately in her confusion early morning 06/25/2020 she has pulled off her NG tube, abdominal exam remains benign and x-ray looks improved, case discussed with GI on 06/25/2020 challenge with clear liquids and monitor with supportive care if significant decline then full comfort measures. Possible pneumonia for which patient is on empiric  antibiotics. Hypokalemia likely from vomiting and NG suction.  Replaced we will continue to monitor. History of hypertension we will keep patient on as needed IV labetalol and add Catapres patch. History of dementia. Acute renal failure from vomiting hydrate and recheck metabolic panel. Possible GI bleed per we will check serial CBCs.  On IV Protonix. Dehydration with hypernatremia and hypokalemia.  Hydrate with  D5W with KCl.      Condition - Extremely Guarded  Family Communication  :  POA Eliot Ford - [phone] - on 06/23/20, 06/24/20, 06/25/20  Code Status :  DNR  Consults  :  GI, CCS  PUD Prophylaxis : PPI   Procedures  :            Disposition Plan  :    Status is: Inpatient  Remains inpatient appropriate because:IV treatments appropriate due to intensity of illness or inability to take PO  Dispo: The patient is from: SNF              Anticipated d/c is to: SNF              Patient currently is not medically stable to d/c.   Difficult to place patient No  DVT Prophylaxis  :    SCDs Start: 06/22/20 2307   Lab Results  Component Value Date   PLT 287 06/25/2020    Diet :  Diet Order             Diet clear liquid Room service appropriate? Yes; Fluid consistency: Thin  Diet effective now                    Inpatient Medications  Scheduled Meds:  bisacodyl  10 mg Rectal Once   chlorhexidine  15 mL Mouth Rinse BID   cloNIDine  0.2 mg Transdermal Weekly   mouth rinse  15 mL Mouth Rinse q12n4p   pantoprazole (PROTONIX) IV  40 mg Intravenous Q12H   Continuous Infusions:  azithromycin 500 mg (06/24/20 2321)   cefTRIAXone (ROCEPHIN)  IV 2 g (06/24/20 2219)   dextrose 5 % with KCl 20 mEq / L 20 mEq (06/25/20 0832)   PRN Meds:.acetaminophen **OR** acetaminophen, hydrALAZINE, labetalol, morphine injection  Antibiotics  :    Anti-infectives (From admission, onward)    Start     Dose/Rate Route Frequency Ordered Stop   06/23/20 2200   cefTRIAXone (ROCEPHIN) 2 g in sodium chloride 0.9 % 100 mL IVPB        2 g 200 mL/hr over 30 Minutes Intravenous Every 24 hours 06/22/20 2308 06/28/20 2159   06/23/20 2200  azithromycin (ZITHROMAX) 500 mg in sodium chloride 0.9 % 250 mL IVPB        500 mg 250 mL/hr over 60 Minutes Intravenous Every 24 hours 06/22/20 2308 06/28/20 2159   06/22/20 2045  azithromycin (ZITHROMAX) 500 mg in sodium chloride 0.9 % 250 mL IVPB        500 mg 250 mL/hr over 60 Minutes Intravenous  Once 06/22/20 2039 06/22/20 2330   06/22/20 2045  cefTRIAXone (ROCEPHIN) 1 g in sodium chloride 0.9 % 100 mL IVPB        1 g 200 mL/hr over 30 Minutes Intravenous  Once 06/22/20 2039 06/22/20 2204        Time Spent in minutes  30   Susa Raring M.D on 06/25/2020 at 11:22 AM  To page go to [email or web address]   [facility] -  Office  [phone]   See all Orders from today for further details    Objective:   Vitals:   06/24/20 1214 06/24/20 2000 06/25/20 0000 06/25/20 0400  BP: (!) 183/101 (!) 152/78 134/82 (!) 164/97  Pulse: 95 88 74 66  Resp: 17 19 16 16   Temp: 98.8 F (37.1 C) 98.4 F (36.9 C) 97.8 F (36.6 C) 98.7 F (37.1 C)  TempSrc: Axillary  Oral Oral Oral  SpO2: 100% 93% 97% 94%    Wt Readings from Last 3 Encounters:  05/08/20 54.4 kg     Intake/Output Summary (Last 24 hours) at 06/25/2020 1122 Last data filed at 06/25/2020 0729 Gross per 24 hour  Intake 1233.69 ml  Output --  Net 1233.69 ml     Physical Exam  Awake but confused, moving all 4 extremities, [place].AT,PERRAL Supple Neck,No JVD, No cervical lymphadenopathy appriciated.  Symmetrical Chest wall movement, Good air movement bilaterally, CTAB RRR,No Gallops, Rubs or new Murmurs, No Parasternal Heave +ve B.Sounds, Abd Soft, No tenderness,   No Cyanosis, Clubbing or edema, No new Rash or bruise    RN pressure injury documentation:     Data Review:    CBC Recent Labs  Lab 06/22/20 1506 06/23/20 0423  06/24/20 0138 06/25/20 0107  WBC 13.8* 11.2* 9.5 9.7  HGB 14.7 16.1* 12.5 11.5*  HCT 46.2* 51.0* 40.2 37.4  PLT 413* 371 318 287  MCV 88.5 88.5 89.9 91.4  MCH 28.2 28.0 28.0 28.1  MCHC 31.8 31.6 31.1 30.7  RDW 13.9 14.1 14.1 14.0  LYMPHSABS  --   --  1.6 2.1  MONOABS  --   --  0.7 0.7  EOSABS  --   --  0.0 0.3  BASOSABS  --   --  0.0 0.1    Recent Labs  Lab 06/22/20 1506 06/23/20 0423 06/24/20 0138 06/25/20 0107  NA 143 146* 148* 151*  K 2.9* 3.2* 3.1* 3.6  CL 101 104 113* 113*  CO2 26 25 28  32  GLUCOSE 145* 181* 151* 121*  BUN 33* 33* 30* 22  CREATININE 1.03* 1.03* 0.95 0.92  CALCIUM 10.2 10.3 9.3 9.1  AST 21  --  19 16  ALT 11  --  12 9  ALKPHOS 69  --  47 47  BILITOT 2.0*  --  1.0 1.1  ALBUMIN 3.8  --  2.6* 2.4*  MG  --   --  2.1 2.2  BNP  --   --  179.9* 350.5*    ------------------------------------------------------------------------------------------------------------------ No results for input(s): CHOL, HDL, LDLCALC, TRIG, CHOLHDL, LDLDIRECT in the last 72 hours.  No results found for: HGBA1C ------------------------------------------------------------------------------------------------------------------ No results for input(s): TSH, T4TOTAL, T3FREE, THYROIDAB in the last 72 hours.  Invalid input(s): FREET3  Cardiac Enzymes No results for input(s): CKMB, TROPONINI, MYOGLOBIN in the last 168 hours.  Invalid input(s): CK ------------------------------------------------------------------------------------------------------------------    Component Value Date/Time   BNP 350.5 (H) 06/25/2020 0107    Micro Results Recent Results (from the past 240 hour(s))  Resp Panel by RT-PCR (Flu A&B, Covid) Nasopharyngeal Swab     Status: None   Collection Time: 06/23/20 12:30 AM   Specimen: Nasopharyngeal Swab; Nasopharyngeal(NP) swabs in vial transport medium  Result Value Ref Range Status   SARS Coronavirus 2 by RT PCR NEGATIVE NEGATIVE Final    Comment:  (NOTE) SARS-CoV-2 target nucleic acids are NOT DETECTED.  The SARS-CoV-2 RNA is generally detectable in upper respiratory specimens during the acute phase of infection. The lowest concentration of SARS-CoV-2 viral copies this assay can detect is 138 copies/mL. A negative result does not preclude SARS-Cov-2 infection and should not be used as the sole basis for treatment or other patient management decisions. A negative result may occur with  improper specimen collection/handling, submission of specimen other than nasopharyngeal swab, presence of viral mutation(s) within the areas targeted by this assay, and inadequate number of viral copies(<138 copies/mL). A negative result  must be combined with clinical observations, patient history, and epidemiological information. The expected result is Negative.  Fact Sheet for Patients:  BloggerCourse.com  Fact Sheet for Healthcare Providers:  SeriousBroker.it  This test is no t yet approved or cleared by the [place] FDA and  has been authorized for detection and/or diagnosis of SARS-CoV-2 by FDA under an Emergency Use Authorization (EUA). This EUA will remain  in effect (meaning this test can be used) for the duration of the COVID-19 declaration under Section 564(b)(1) of the Act, 21 U.S.C.section 360bbb-3(b)(1), unless the authorization is terminated  or revoked sooner.       Influenza A by PCR NEGATIVE NEGATIVE Final   Influenza B by PCR NEGATIVE NEGATIVE Final    Comment: (NOTE) The Xpert Xpress SARS-CoV-2/FLU/RSV plus assay is intended as an aid in the diagnosis of influenza from Nasopharyngeal swab specimens and should not be used as a sole basis for treatment. Nasal washings and aspirates are unacceptable for Xpert Xpress SARS-CoV-2/FLU/RSV testing.  Fact Sheet for Patients: BloggerCourse.com  Fact Sheet for Healthcare  Providers: SeriousBroker.it  This test is not yet approved or cleared by the [place] FDA and has been authorized for detection and/or diagnosis of SARS-CoV-2 by FDA under an Emergency Use Authorization (EUA). This EUA will remain in effect (meaning this test can be used) for the duration of the COVID-19 declaration under Section 564(b)(1) of the Act, 21 U.S.C. section 360bbb-3(b)(1), unless the authorization is terminated or revoked.  Performed at [facility], [address] [place], [place] [address]     Radiology Reports CT Abdomen Pelvis Wo Contrast  Result Date: 06/22/2020 CLINICAL DATA:  Abdominal pain and vomiting. EXAM: CT ABDOMEN AND PELVIS WITHOUT CONTRAST TECHNIQUE: Multidetector CT imaging of the abdomen and pelvis was performed following the standard protocol without IV contrast. COMPARISON:  CT abdomen pelvis dated May 09, 2020. FINDINGS: Lower chest: New consolidation in the left lower lobe, incompletely visualized. Hepatobiliary: No focal liver abnormality is seen. Status post cholecystectomy. No biliary dilatation. Pancreas: Atrophic. No ductal dilatation or surrounding inflammatory changes. Spleen: Normal in size without focal abnormality. Adrenals/Urinary Tract: Adrenal glands are unremarkable. No renal calculi or hydronephrosis. The bladder is unremarkable. 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 The stomach and lower esophagus are distended with fluid. No wall thickening or pneumatosis. No small bowel obstruction. Extensive sigmoid colonic diverticulosis again noted. Normal appendix. Vascular/Lymphatic: Aortic atherosclerosis. No enlarged abdominal or pelvic lymph nodes. Reproductive: Uterus and bilateral adnexa are unremarkable. Other: Unchanged tiny fat containing umbilical hernia. No free fluid or pneumoperitoneum. Musculoskeletal: No acute or significant  osseous findings. IMPRESSION: 1. 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 The stomach and lower esophagus are distended with fluid, suggestive of low-grade obstruction. This is unchanged in appearance from the prior scan. 2. New consolidation in the left lower lobe, incompletely visualized, concerning for pneumonia. 3. Aortic Atherosclerosis (ICD10-I70.0). Electronically Signed   By: Obie Dredge M.D.   On: 06/22/2020 21:15   DG Abd 1 View  Result Date: 06/25/2020 CLINICAL DATA:  87 year old female with nausea and emesis EXAM: ABDOMEN - 1 VIEW COMPARISON:  Prior abdominal radiograph 01/25/2020 FINDINGS: Nasogastric tube no longer visualized. The bowel gas pattern is normal. Surgical clips in the right upper quadrant consistent with prior cholecystectomy. Solitary surgical clip in the right lower quadrant suggest possible prior appendectomy. Moderate stool burden overlies the rectum. Multilevel degenerative disc disease.  No lytic or blastic osseous lesion. IMPRESSION: Nasogastric tube no longer visualized. It may have been removed, or migrated up into the esophagus. Nonobstructed bowel gas pattern. Moderate volume of formed stool overlying the rectum. Electronically Signed   By: Malachy MoanHeath  McCullough M.D.   On: 06/25/2020 07:49   DG Abd 1 View  Result Date: 06/24/2020 CLINICAL DATA:  Nausea and vomiting 1 day. EXAM: ABDOMEN - 1 VIEW COMPARISON:  06/22/2020 FINDINGS: Nasogastric tube has tip over the stomach in the left upper quadrant. Bowel gas pattern is nonobstructive. No free peritoneal air. Single surgical clip over the right lower abdomen. Surgical clips over the right upper quadrant. There are degenerative changes of the spine. Degenerative change of the hips right worse than left. IMPRESSION: 1. Nonobstructive bowel gas pattern. 2. Nasogastric tube with tip over the stomach in the left upper quadrant. Electronically Signed   By: Elberta Fortisaniel   Boyle M.D.   On: 06/24/2020 08:04   DG Abd Acute W/Chest  Result Date: 06/22/2020 CLINICAL DATA:  Vomiting today. EXAM: DG ABDOMEN ACUTE WITH 1 VIEW CHEST COMPARISON:  CT abdomen pelvis dated May 09, 2020. Chest x-ray dated February 08, 2019. FINDINGS: There is no evidence of dilated bowel loops or free intraperitoneal air. Paucity of small bowel gas. Air and stool are seen in the colon. Unchanged paraesophageal hiatal hernia. No radiopaque calculi or other significant radiographic abnormality is seen. Normal heart size. New patchy opacity in the peripheral left mid lung. Possible small left pleural effusion. The right lung is clear. No pneumothorax. No acute osseous abnormality. IMPRESSION: 1. New patchy opacity in the peripheral left mid lung, suspicious for pneumonia. 2. Possible small left pleural effusion. 3. Unchanged paraesophageal hiatal hernia.  No bowel obstruction. Electronically Signed   By: Obie DredgeWilliam T Derry M.D.   On: 06/22/2020 19:26

## 2020-06-25 NOTE — Progress Notes (Signed)
Initial Nutrition Assessment  DOCUMENTATION CODES:   Not applicable  INTERVENTION:   -Boost Breeze po TID, each supplement provides 250 kcal and 9 grams of protein  -MVI with minerals daily -RD will follow for diet advancement and adjust supplement regimen as appropriate  NUTRITION DIAGNOSIS:   Inadequate oral intake related to altered GI function as evidenced by NPO status.  GOAL:   Patient will meet greater than or equal to 90% of their needs  MONITOR:   PO intake, Supplement acceptance, Diet advancement, Labs, Weight trends, Skin, I & O's  REASON FOR ASSESSMENT:   Low Braden    ASSESSMENT:   Natalie Spencer is a 87 y.o. female with history of advanced dementia was brought to the ER after patient had nausea vomiting.  Its not sure how long patient has been having vomiting.  Patient was admitted about 2 months ago for similar situation at that time EGD showed esophagitis.  Work-up in the ER at this time showed that patient had gastric volvulus with obstruction, GI general surgery were consulted and she was admitted for further treatment.  Pt admitted with nausea and vomiting. CT scan revealed mesenteric axial volvulus of the stomach with possible obstruction.   6/12- NGT placed 6/13- NGT pulled out by pt, refusing replacement; advanced to clear liquid diet  Reviewed I/O's: -50 ml x 24 hours and +1.3 L since admission  NGT output: 50 ml x 24 hours  Per GI notes, repeat KUB this AM revealed no BGP and stool in rectum.   Pt resting in recliner chair at time of visit. She did not arouse to touch or voice.   Unable to obtain wt at this time. No recent wt available to assess at this time.   Pt was just advanced to clear liquid diet by GI. No meal intake data available to assess at this time.   Medications reviewed and include dextrose 5% with KCl 20 mEq/L infusion @ 100 ml/hr.   No results found for: HGBA1C PTA DM medications are none.   Labs reviewed: Na: 151,CBGS:  110-120 (inpatient orders for glycemic control are none).    NUTRITION - FOCUSED PHYSICAL EXAM:  Flowsheet Row Most Recent Value  Orbital Region No depletion  Upper Arm Region Moderate depletion  Thoracic and Lumbar Region No depletion  Buccal Region No depletion  Temple Region Mild depletion  Clavicle Bone Region No depletion  Clavicle and Acromion Bone Region No depletion  Scapular Bone Region No depletion  Dorsal Hand Unable to assess  Patellar Region Mild depletion  Anterior Thigh Region Mild depletion  Posterior Calf Region Moderate depletion  Edema (RD Assessment) None  Hair Reviewed  Eyes Reviewed  Mouth Reviewed  Skin Reviewed  Nails Reviewed       Diet Order:   Diet Order             Diet clear liquid Room service appropriate? Yes; Fluid consistency: Thin  Diet effective now                   EDUCATION NEEDS:   Not appropriate for education at this time  Skin:  Skin Assessment: Reviewed RN Assessment  Last BM:  Unknown  Height:   Ht Readings from Last 1 Encounters:  05/08/20 5' (1.524 m)    Weight:   Wt Readings from Last 1 Encounters:  05/08/20 54.4 kg    Ideal Body Weight:  45.5 kg  BMI:  There is no height or weight on file to  calculate BMI.  Estimated Nutritional Needs:   Kcal:  1350-1550  Protein:  65-80 grams  Fluid:  > 1.3 L    Levada Schilling, RD, LDN, CDCES Registered Dietitian II Certified Diabetes Care and Education Specialist Please refer to [facility] for RD and/or RD on-call/weekend/after hours pager

## 2020-06-25 NOTE — Progress Notes (Addendum)
   Palliative Medicine Inpatient Follow Up Note  Reason for consult:  Goals of Care   HPI:  Per intake H&P --> Yomira Laverdiere is a 87 y.o. female with history of advanced dementia was brought to the ER after patient had nausea vomiting.  Its not sure how long patient has been having vomiting.  Patient was admitted about 2 months ago for similar situation at that time EGD showed esophagitis.   Palliative care has been asked to get involved to discuss goals of care in the setting of advanced dementia and recurrent gastric volvulus.   Today's Discussion (06/25/2020):  *Please note that this is a verbal dictation therefore any spelling or grammatical errors are due to the "Dragon Medical One" system interpretation.  Chart reviewed.  S/P EGD w/ gastric decompression NGT was placed under endoscopic guidance.   Per conversation with RN, Carlee as significant amount of gastric contents were removed yesterday with the procedure.   Upon exam this morning Lyne remains pleasantly altered. She is more conversant this morning and is able to share with me her name only. Her abdomen is less distended. No longer belching or vomiting. Did require a dose of morphine this morning for generalized discomfort.   Plan remains to continue conservative measures to improve patients condition. If for any reason she should decline we would then transition emphasis to comfort.   Questions and concerns addressed   Objective Assessment: Vital Signs Vitals:   06/25/20 0000 06/25/20 0400  BP: 134/82 (!) 164/97  Pulse: 74 66  Resp: 16 16  Temp: 97.8 F (36.6 C) 98.7 F (37.1 C)  SpO2: 97% 94%    Intake/Output Summary (Last 24 hours) at 06/25/2020 9604 Last data filed at 06/24/2020 0856 Gross per 24 hour  Intake --  Output 50 ml  Net -50 ml    Gen:  Chronically ill appearing caucasian F HEENT: moist mucous membranes CV: Irregular rate and rhythm PULM: On 3LPM Force ABD: Soft, Nontender EXT: No edema Neuro:  Disoriented  Decision Maker: Eliot Ford (Legal Guardian) - [phone]   SUMMARY OF RECOMMENDATIONS   DNAR/DNI   Ideally goal would be for improvement   If not improvements occur plan for comfort oriented care   Ongoing incremental PMT support  Time Spent: 25 Greater than 50% of the time was spent in counseling and coordination of care ______________________________________________________________________________________ Lamarr Lulas  Palliative Medicine Team Team Cell Phone: [phone] Please utilize secure chat with additional questions, if there is no response within 30 minutes please call the above phone number  Palliative Medicine Team providers are available by phone from 7am to 7pm daily and can be reached through the team cell phone.  Should this patient require assistance outside of these hours, please call the patient's attending physician.

## 2020-06-25 NOTE — Progress Notes (Signed)
Daily Rounding Note  06/25/2020, 9:13 AM  LOS: 3 days   SUBJECTIVE:   Chief complaint:   Gastric volvulus.  NGT output only 50 mL recorded yesterday but she pulled the NGT out last night.  No stools reported, pt can  not tell me anything but denies abd pain.    OBJECTIVE:         Vital signs in last 24 hours:    Temp:  [97.8 F (36.6 C)-98.8 F (37.1 C)] 98.7 F (37.1 C) (06/13 0400) Pulse Rate:  [66-95] 66 (06/13 0400) Resp:  [16-19] 16 (06/13 0400) BP: (134-183)/(78-101) 164/97 (06/13 0400) SpO2:  [93 %-100 %] 94 % (06/13 0400)   There were no vitals filed for this visit. General: comfortable, demented, alert   Heart: RRR Chest: no labored breathing.  Clear in front Abdomen: soft, no audible BS normal or pathologic.  NT Extremities: no CCE Neuro/Psych:  confused, verbal.  Kept telling me that I am great.  Not following commands.    Intake/Output from previous day: 06/12 0701 - 06/13 0700 In: -  Out: 50 [Emesis/NG output:50]  Intake/Output this shift: Total I/O In: 1233.7 [I.V.:1233.7] Out: -   Lab Results: Recent Labs    06/23/20 0423 06/24/20 0138 06/25/20 0107  WBC 11.2* 9.5 9.7  HGB 16.1* 12.5 11.5*  HCT 51.0* 40.2 37.4  PLT 371 318 287   BMET Recent Labs    06/23/20 0423 06/24/20 0138 06/25/20 0107  NA 146* 148* 151*  K 3.2* 3.1* 3.6  CL 104 113* 113*  CO2 25 28 32  GLUCOSE 181* 151* 121*  BUN 33* 30* 22  CREATININE 1.03* 0.95 0.92  CALCIUM 10.3 9.3 9.1   LFT Recent Labs    06/22/20 1506 06/24/20 0138 06/25/20 0107  PROT 8.1 6.1* 5.7*  ALBUMIN 3.8 2.6* 2.4*  AST 21 19 16   ALT 11 12 9   ALKPHOS 69 47 47  BILITOT 2.0* 1.0 1.1   PT/INR No results for input(s): LABPROT, INR in the last 72 hours. Hepatitis Panel No results for input(s): HEPBSAG, HCVAB, HEPAIGM, HEPBIGM in the last 72 hours.  Studies/Results: DG Abd 1 View  Result Date: 06/25/2020 CLINICAL DATA:   87 year old female with nausea and emesis EXAM: ABDOMEN - 1 VIEW COMPARISON:  Prior abdominal radiograph 01/25/2020 FINDINGS: Nasogastric tube no longer visualized. The bowel gas pattern is normal. Surgical clips in the right upper quadrant consistent with prior cholecystectomy. Solitary surgical clip in the right lower quadrant suggest possible prior appendectomy. Moderate stool burden overlies the rectum. Multilevel degenerative disc disease. No lytic or blastic osseous lesion. IMPRESSION: Nasogastric tube no longer visualized. It may have been removed, or migrated up into the esophagus. Nonobstructed bowel gas pattern. Moderate volume of formed stool overlying the rectum. Electronically Signed   By: 99 M.D.   On: 06/25/2020 07:49   DG Abd 1 View  Result Date: 06/24/2020 CLINICAL DATA:  Nausea and vomiting 1 day. EXAM: ABDOMEN - 1 VIEW COMPARISON:  06/22/2020 FINDINGS: Nasogastric tube has tip over the stomach in the left upper quadrant. Bowel gas pattern is nonobstructive. No free peritoneal air. Single surgical clip over the right lower abdomen. Surgical clips over the right upper quadrant. There are degenerative changes of the spine. Degenerative change of the hips right worse than left. IMPRESSION: 1. Nonobstructive bowel gas pattern. 2. Nasogastric tube with tip over the stomach in the left upper quadrant. Electronically Signed   By: 08/24/2020  Micheline Maze M.D.   On: 06/24/2020 08:04    ASSESMENT:       Gastric volvulus. 06/23/2020 EGD with esophageal dilatation, erosions/inflammation.  Large paraesophageal hernia involving distal stomach.  Gastritis.  Gastric volvulus with distal stomach within paraesophageal hernia.  Dr. Russella Dar unable to reduce volvulus during the procedure. NG tube remains in place to LIS. GG Upper GI series: this on hold since NGT is out and pt will not drink contrast.  Repeat KUB this AM: NO BGP and stool in rectum.        Hypernatremia.  Progressive.  Sodium 151.      AKI.  Resolved.  Normocytic anemia. Hgb 16.1 >> 11.5.  was 11.2 on 05/12/20.  So suspect the Hgb was elevated in setting of AKI/dehydration.  FOBT +.    Hypertension.  Readings to 180s/101.   PLAN     Add clears.  Dulcolax PR.      Natalie Spencer  06/25/2020, 9:13 AM Phone [phone]

## 2020-06-25 NOTE — TOC Initial Note (Addendum)
Transition of Care [facility]) - Initial/Assessment Note    Patient Details  Name: Natalie Spencer MRN: [id number] Date of Birth: [birth year]  Transition of Care [facility]) CM/SW Contact:    Mearl Latin, LCSW Phone Number: 06/25/2020, 9:06 AM  Clinical Narrative:                 CSW spoke with patient's Guardian, [name]. She is requesting patient return to [facility] with hospice care if patient is able to be discharged back to the facility as opposed to directly to hospice. [facility] confirmed that patient is from their ALF/memory care and can return there with hospice. CSW will follow up with Meka [phone]) once plan known. Patient will need PTAR for transport.   CSW spoke with Meka. She reported to let her know when patient is ready for discharge and place "Hospice at memory care" on the DC Summary. Once patient arrives she will contact [facility] to initiate services. Patient is on oxygen at the facility. If patient does not discharge tomorrow, she will need an updated COVID test.   Expected Discharge Plan: Skilled Nursing Facility Barriers to Discharge: Continued Medical Work up   Patient Goals and CMS Choice Patient states their goals for this hospitalization and ongoing recovery are:: comfort CMS Medicare.gov Compare Post Acute Care list provided to:: Legal Guardian Choice offered to / list presented to : [facility] POA / Guardian ([name])  Expected Discharge Plan and Services Expected Discharge Plan: Skilled Nursing Facility In-house Referral: Clinical Social Work   Post Acute Care Choice: Skilled Nursing Facility, Hospice Living arrangements for the past 2 months: Skilled Nursing Facility                                      Prior Living Arrangements/Services Living arrangements for the past 2 months: Skilled Nursing Facility Lives with:: Facility Resident Patient language and need for interpreter reviewed:: Yes Do you feel safe going back to the place where you  live?: Yes      Need for Family Participation in Patient Care: Yes (Comment) Care giver support system in place?: Yes (comment) Current home services: DME Criminal Activity/Legal Involvement Pertinent to Current Situation/Hospitalization: No - Comment as needed  Activities of Daily Living      Permission Sought/Granted Permission sought to share information with : Facility Medical sales representative, Family Supports Permission granted to share information with : No  Share Information with NAME: Eliot Ford  Permission granted to share info w AGENCY: [facility]  Permission granted to share info w Relationship: Guardian  Permission granted to share info w Contact Information: [phone]  Emotional Assessment Appearance:: Appears stated age Attitude/Demeanor/Rapport: Unable to Assess Affect (typically observed): Unable to Assess Orientation: : Oriented to Self Alcohol / Substance Use: Not Applicable Psych Involvement: No (comment)  Admission diagnosis:  Gastric volvulus [K31.89] Paraesophageal hernia [K44.9] Hypokalemia [E87.6] Nausea & vomiting [R11.2] Nausea and vomiting in adult [R11.2] Community acquired pneumonia of left lower lobe of lung [J18.9] Patient Active Problem List   Diagnosis Date Noted   Gastric volvulus    Abnormal CT scan, stomach    CAP (community acquired pneumonia) 06/22/2020   ARF (acute renal failure) (HCC) 06/22/2020   Hematemesis 05/10/2020   Gastroesophageal reflux disease with esophagitis without hemorrhage    Paraesophageal hernia    Acute upper GI bleed 05/09/2020   Nausea & vomiting 05/09/2020   Lactic acidosis 05/09/2020  Hypertension    Leukocytosis    Hypokalemia    TBI (traumatic brain injury) (HCC) 02/08/2018   Injury of globe of left eye 02/08/2018   Contusion of right middle finger 02/08/2018   Fall 02/08/2018   Dementia (HCC) 02/08/2018   Anemia 02/08/2018   Bilateral lower extremity edema 02/08/2018   PCP:  Patient, No Pcp  Per (Inactive) Pharmacy:   [facility] - [place], [place] - [address] [place] [address] Phone: [phone] Fax: [phone]     Social Determinants of Health (SDOH) Interventions    Readmission Risk Interventions No flowsheet data found.

## 2020-06-25 NOTE — Progress Notes (Addendum)
Attempted to placed NG tube. Pt is refusing placement by hitting at staff and stating, "let me alone" repetitively. MD made aware.

## 2020-06-26 ENCOUNTER — Inpatient Hospital Stay (HOSPITAL_COMMUNITY): Payer: Medicare Other

## 2020-06-26 LAB — CBC WITH DIFFERENTIAL/PLATELET
Abs Immature Granulocytes: 0.08 10*3/uL — ABNORMAL HIGH (ref 0.00–0.07)
Basophils Absolute: 0 10*3/uL (ref 0.0–0.1)
Basophils Relative: 0 %
Eosinophils Absolute: 0.5 10*3/uL (ref 0.0–0.5)
Eosinophils Relative: 4 %
HCT: 39.3 % (ref 36.0–46.0)
Hemoglobin: 12 g/dL (ref 12.0–15.0)
Immature Granulocytes: 1 %
Lymphocytes Relative: 26 %
Lymphs Abs: 2.8 10*3/uL (ref 0.7–4.0)
MCH: 27.9 pg (ref 26.0–34.0)
MCHC: 30.5 g/dL (ref 30.0–36.0)
MCV: 91.4 fL (ref 80.0–100.0)
Monocytes Absolute: 0.8 10*3/uL (ref 0.1–1.0)
Monocytes Relative: 7 %
Neutro Abs: 6.7 10*3/uL (ref 1.7–7.7)
Neutrophils Relative %: 62 %
Platelets: 250 10*3/uL (ref 150–400)
RBC: 4.3 MIL/uL (ref 3.87–5.11)
RDW: 13.6 % (ref 11.5–15.5)
WBC: 10.8 10*3/uL — ABNORMAL HIGH (ref 4.0–10.5)
nRBC: 0 % (ref 0.0–0.2)

## 2020-06-26 LAB — GLUCOSE, CAPILLARY
Glucose-Capillary: 112 mg/dL — ABNORMAL HIGH (ref 70–99)
Glucose-Capillary: 115 mg/dL — ABNORMAL HIGH (ref 70–99)
Glucose-Capillary: 88 mg/dL (ref 70–99)
Glucose-Capillary: 114 mg/dL — ABNORMAL HIGH (ref 70–99)

## 2020-06-26 LAB — COMPREHENSIVE METABOLIC PANEL
ALT: 13 U/L (ref 0–44)
AST: 24 U/L (ref 15–41)
Albumin: 2.3 g/dL — ABNORMAL LOW (ref 3.5–5.0)
Alkaline Phosphatase: 45 U/L (ref 38–126)
Anion gap: 8 (ref 5–15)
BUN: 14 mg/dL (ref 8–23)
CO2: 25 mmol/L (ref 22–32)
Calcium: 8.7 mg/dL — ABNORMAL LOW (ref 8.9–10.3)
Chloride: 106 mmol/L (ref 98–111)
Creatinine, Ser: 0.8 mg/dL (ref 0.44–1.00)
GFR, Estimated: >60 mL/min (ref >60)
Glucose, Bld: 132 mg/dL — ABNORMAL HIGH (ref 70–99)
Potassium: 4.1 mmol/L (ref 3.5–5.1)
Sodium: 139 mmol/L (ref 135–145)
Total Bilirubin: 1 mg/dL (ref 0.3–1.2)
Total Protein: 5.3 g/dL — ABNORMAL LOW (ref 6.5–8.1)

## 2020-06-26 LAB — MAGNESIUM: Magnesium: 1.7 mg/dL (ref 1.7–2.4)

## 2020-06-26 LAB — BRAIN NATRIURETIC PEPTIDE: B Natriuretic Peptide: 396.6 pg/mL — ABNORMAL HIGH (ref 0.0–100.0)

## 2020-06-26 MED ORDER — AMLODIPINE BESYLATE 10 MG PO TABS
10.0000 mg | ORAL_TABLET | Freq: Every day | ORAL | Status: DC
  Administered 2020-06-26 – 2020-06-27 (×2): 10 mg via ORAL
  Filled 2020-06-26 (×2): qty 1

## 2020-06-26 MED ORDER — SODIUM CHLORIDE 0.9 % IV SOLN
1.5000 g | Freq: Four times a day (QID) | INTRAVENOUS | Status: DC
  Administered 2020-06-26 – 2020-06-27 (×6): 1.5 g via INTRAVENOUS
  Filled 2020-06-26: qty 1.5
  Filled 2020-06-26 (×6): qty 4
  Filled 2020-06-26: qty 1.5

## 2020-06-26 MED ORDER — LIP MEDEX EX OINT
TOPICAL_OINTMENT | CUTANEOUS | Status: DC | PRN
  Administered 2020-06-27: 1 via TOPICAL
  Filled 2020-06-26: qty 7

## 2020-06-26 NOTE — Plan of Care (Signed)

## 2020-06-26 NOTE — Progress Notes (Signed)
PROGRESS NOTE                                                                                                                                                                                                             Patient Demographics:    Natalie Spencer, is a 87 y.o. female, DOB - 1926/08/26, LKG:[id number]  Outpatient Primary MD for the patient is Patient, No Pcp Per (Inactive)    LOS - 4  Admit date - 06/22/2020    Chief Complaint  Patient presents with   Emesis       Brief Narrative (HPI from H&P)  - Natalie Spencer is a 87 y.o. female with history of advanced dementia was brought to the ER after patient had nausea vomiting.  Its not sure how long patient has been having vomiting.  Patient was admitted about 2 months ago for similar situation at that time EGD showed esophagitis.  Work-up in the ER at this time showed that patient had gastric volvulus with obstruction, GI general surgery were consulted and she was admitted for further treatment.   Subjective:   Patient in bed appears to be in no distress, although confused she denies any headache chest or abdominal pain.  No nausea.   Assessment  & Plan :     Nausea vomiting with CT scan showing mesenteric axial volvulus of the stomach with possible obstruction - she remains n.p.o., GI and general surgery on board, underwent EGD with NG tube placement with EGD on 06/23/2020, scope could not be advanced much beyond the esophagus, general surgery deems patient to be a poor candidate which I agree with, for now abdomen appears to be soft, unfortunately in her confusion early morning 06/25/2020 she has pulled off her NG tube, abdominal exam remains benign and x-ray looks improved, GI following, Natalie Spencer tolerating clear liquids, will defer further GI to advance diet.  Case discussed with POA Ms. [name] in detail on a daily basis plan is once she gets better to return to memory care unit  with DNR and hospice follow-up.  No further heroics. Possible pneumonia for which patient is on empiric antibiotics, transition to Unasyn only on 06/26/2020. Hypokalemia likely from vomiting and NG suction.  Replaced we will continue to monitor. History of hypertension we will keep patient on as needed IV labetalol and add Catapres patch.  Oral Norvasc added. History of dementia. Acute renal failure from vomiting hydrate and recheck metabolic panel. Possible GI bleed per we will check serial CBCs.  On IV Protonix. Dehydration with hypernatremia and hypokalemia.  Resolved after D5W stop further IV fluids.      Condition - Extremely Guarded  Family Communication  :  POA Eliot Ford- Sylvester,[name] -- [phone] - on 06/23/20, 06/24/20, 06/25/20  Code Status :  DNR, once doing better discharge back to SNF with hospice/palliative care per POA  Consults  :  GI, CCS  PUD Prophylaxis : PPI   Procedures  :            Disposition Plan  :    Status is: Inpatient  Remains inpatient appropriate because:IV treatments appropriate due to intensity of illness or inability to take PO  Dispo: The patient is from: SNF              Anticipated d/c is to: SNF              Patient currently is not medically stable to d/c.   Difficult to place patient No  DVT Prophylaxis  :    SCDs Start: 06/22/20 2307   Lab Results  Component Value Date   PLT 250 06/26/2020    Diet :  Diet Order             Diet clear liquid Room service appropriate? Yes; Fluid consistency: Thin  Diet effective now                    Inpatient Medications  Scheduled Meds:  amLODipine  10 mg Oral Daily   chlorhexidine  15 mL Mouth Rinse BID   cloNIDine  0.2 mg Transdermal Weekly   mouth rinse  15 mL Mouth Rinse q12n4p   pantoprazole (PROTONIX) IV  40 mg Intravenous Q12H   Continuous Infusions:   PRN Meds:.acetaminophen **OR** acetaminophen, hydrALAZINE, labetalol, lip balm, morphine injection  Antibiotics  :     Anti-infectives (From admission, onward)    Start     Dose/Rate Route Frequency Ordered Stop   06/23/20 2200  cefTRIAXone (ROCEPHIN) 2 g in sodium chloride 0.9 % 100 mL IVPB  Status:  Discontinued        2 g 200 mL/hr over 30 Minutes Intravenous Every 24 hours 06/22/20 2308 06/26/20 0914   06/23/20 2200  azithromycin (ZITHROMAX) 500 mg in sodium chloride 0.9 % 250 mL IVPB  Status:  Discontinued        500 mg 250 mL/hr over 60 Minutes Intravenous Every 24 hours 06/22/20 2308 06/26/20 0914   06/22/20 2045  azithromycin (ZITHROMAX) 500 mg in sodium chloride 0.9 % 250 mL IVPB        500 mg 250 mL/hr over 60 Minutes Intravenous  Once 06/22/20 2039 06/22/20 2330   06/22/20 2045  cefTRIAXone (ROCEPHIN) 1 g in sodium chloride 0.9 % 100 mL IVPB        1 g 200 mL/hr over 30 Minutes Intravenous  Once 06/22/20 2039 06/22/20 2204        Time Spent in minutes  30   Susa RaringPrashant Janan Bogie M.D on 06/26/2020 at 9:14 AM  To page go to [email or web address]   [facility] -  Office  [phone]   See all Orders from today for further details    Objective:   Vitals:   06/25/20 1404 06/25/20 2045 06/26/20 0452 06/26/20 0758  BP: (!) 155/78 (!) 150/80 (!) 146/90 (!) 155/74  Pulse:  88 67 75 (!) 134  Resp: 18 20 (!) 22 19  Temp: 99.3 F (37.4 C) 97.6 F (36.4 C) 97.8 F (36.6 C) 98.6 F (37 C)  TempSrc: Oral Axillary Axillary Oral  SpO2: 95% 98% 98% 97%    Wt Readings from Last 3 Encounters:  05/08/20 54.4 kg     Intake/Output Summary (Last 24 hours) at 06/26/2020 0914 Last data filed at 06/26/2020 4650 Gross per 24 hour  Intake 1625.67 ml  Output --  Net 1625.67 ml     Physical Exam  Awake but confused, does not appear to be in any distress and moves all 4 extremities by herself .AT,PERRAL Supple Neck,No JVD, No cervical lymphadenopathy appriciated.  Symmetrical Chest wall movement, Good air movement bilaterally, CTAB RRR,No Gallops, Rubs or new Murmurs, No Parasternal  Heave +ve B.Sounds, Abd Soft, No tenderness,   No Cyanosis, Clubbing or edema, No new Rash or bruise    RN pressure injury documentation:     Data Review:    CBC Recent Labs  Lab 06/22/20 1506 06/23/20 0423 06/24/20 0138 06/25/20 0107 06/26/20 0122  WBC 13.8* 11.2* 9.5 9.7 10.8*  HGB 14.7 16.1* 12.5 11.5* 12.0  HCT 46.2* 51.0* 40.2 37.4 39.3  PLT 413* 371 318 287 250  MCV 88.5 88.5 89.9 91.4 91.4  MCH 28.2 28.0 28.0 28.1 27.9  MCHC 31.8 31.6 31.1 30.7 30.5  RDW 13.9 14.1 14.1 14.0 13.6  LYMPHSABS  --   --  1.6 2.1 2.8  MONOABS  --   --  0.7 0.7 0.8  EOSABS  --   --  0.0 0.3 0.5  BASOSABS  --   --  0.0 0.1 0.0    Recent Labs  Lab 06/22/20 1506 06/23/20 0423 06/24/20 0138 06/25/20 0107 06/26/20 0122  NA 143 146* 148* 151* 139  K 2.9* 3.2* 3.1* 3.6 4.1  CL 101 104 113* 113* 106  CO2 26 25 28  32 25  GLUCOSE 145* 181* 151* 121* 132*  BUN 33* 33* 30* 22 14  CREATININE 1.03* 1.03* 0.95 0.92 0.80  CALCIUM 10.2 10.3 9.3 9.1 8.7*  AST 21  --  19 16 24   ALT 11  --  12 9 13   ALKPHOS 69  --  47 47 45  BILITOT 2.0*  --  1.0 1.1 1.0  ALBUMIN 3.8  --  2.6* 2.4* 2.3*  MG  --   --  2.1 2.2 1.7  BNP  --   --  179.9* 350.5* 396.6*    ------------------------------------------------------------------------------------------------------------------ No results for input(s): CHOL, HDL, LDLCALC, TRIG, CHOLHDL, LDLDIRECT in the last 72 hours.  No results found for: HGBA1C ------------------------------------------------------------------------------------------------------------------ No results for input(s): TSH, T4TOTAL, T3FREE, THYROIDAB in the last 72 hours.  Invalid input(s): FREET3  Cardiac Enzymes No results for input(s): CKMB, TROPONINI, MYOGLOBIN in the last 168 hours.  Invalid input(s): CK ------------------------------------------------------------------------------------------------------------------    Component Value Date/Time   BNP 396.6 (H) 06/26/2020  0122    Micro Results Recent Results (from the past 240 hour(s))  Resp Panel by RT-PCR (Flu A&B, Covid) Nasopharyngeal Swab     Status: None   Collection Time: 06/23/20 12:30 AM   Specimen: Nasopharyngeal Swab; Nasopharyngeal(NP) swabs in vial transport medium  Result Value Ref Range Status   SARS Coronavirus 2 by RT PCR NEGATIVE NEGATIVE Final    Comment: (NOTE) SARS-CoV-2 target nucleic acids are NOT DETECTED.  The SARS-CoV-2 RNA is generally detectable in upper respiratory specimens during the acute phase of infection. The  lowest concentration of SARS-CoV-2 viral copies this assay can detect is 138 copies/mL. A negative result does not preclude SARS-Cov-2 infection and should not be used as the sole basis for treatment or other patient management decisions. A negative result may occur with  improper specimen collection/handling, submission of specimen other than nasopharyngeal swab, presence of viral mutation(s) within the areas targeted by this assay, and inadequate number of viral copies(<138 copies/mL). A negative result must be combined with clinical observations, patient history, and epidemiological information. The expected result is Negative.  Fact Sheet for Patients:  BloggerCourse.com  Fact Sheet for Healthcare Providers:  SeriousBroker.it  This test is no t yet approved or cleared by the [place] FDA and  has been authorized for detection and/or diagnosis of SARS-CoV-2 by FDA under an Emergency Use Authorization (EUA). This EUA will remain  in effect (meaning this test can be used) for the duration of the COVID-19 declaration under Section 564(b)(1) of the Act, 21 U.S.C.section 360bbb-3(b)(1), unless the authorization is terminated  or revoked sooner.       Influenza A by PCR NEGATIVE NEGATIVE Final   Influenza B by PCR NEGATIVE NEGATIVE Final    Comment: (NOTE) The Xpert Xpress SARS-CoV-2/FLU/RSV plus  assay is intended as an aid in the diagnosis of influenza from Nasopharyngeal swab specimens and should not be used as a sole basis for treatment. Nasal washings and aspirates are unacceptable for Xpert Xpress SARS-CoV-2/FLU/RSV testing.  Fact Sheet for Patients: BloggerCourse.com  Fact Sheet for Healthcare Providers: SeriousBroker.it  This test is not yet approved or cleared by the [place] FDA and has been authorized for detection and/or diagnosis of SARS-CoV-2 by FDA under an Emergency Use Authorization (EUA). This EUA will remain in effect (meaning this test can be used) for the duration of the COVID-19 declaration under Section 564(b)(1) of the Act, 21 U.S.C. section 360bbb-3(b)(1), unless the authorization is terminated or revoked.  Performed at [facility], [address] [place], [place] [address]     Radiology Reports CT Abdomen Pelvis Wo Contrast  Result Date: 06/22/2020 CLINICAL DATA:  Abdominal pain and vomiting. EXAM: CT ABDOMEN AND PELVIS WITHOUT CONTRAST TECHNIQUE: Multidetector CT imaging of the abdomen and pelvis was performed following the standard protocol without IV contrast. COMPARISON:  CT abdomen pelvis dated May 09, 2020. FINDINGS: Lower chest: New consolidation in the left lower lobe, incompletely visualized. Hepatobiliary: No focal liver abnormality is seen. Status post cholecystectomy. No biliary dilatation. Pancreas: Atrophic. No ductal dilatation or surrounding inflammatory changes. Spleen: Normal in size without focal abnormality. Adrenals/Urinary Tract: Adrenal glands are unremarkable. No renal calculi or hydronephrosis. The bladder is unremarkable. 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 The stomach and lower esophagus are distended with fluid. No wall thickening or pneumatosis. No small bowel obstruction. Extensive  sigmoid colonic diverticulosis again noted. Normal appendix. Vascular/Lymphatic: Aortic atherosclerosis. No enlarged abdominal or pelvic lymph nodes. Reproductive: Uterus and bilateral adnexa are unremarkable. Other: Unchanged tiny fat containing umbilical hernia. No free fluid or pneumoperitoneum. Musculoskeletal: No acute or significant osseous findings. IMPRESSION: 1. 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 The stomach and lower esophagus are distended with fluid, suggestive of low-grade obstruction. This is unchanged in appearance from the prior scan. 2. New consolidation in the left lower lobe, incompletely visualized, concerning for pneumonia. 3. Aortic Atherosclerosis (ICD10-I70.0). Electronically Signed   By: Obie Dredge M.D.   On: 06/22/2020 21:15  DG Abd 1 View  Result Date: 06/26/2020 CLINICAL DATA:  87 year old female with nausea and vomiting. EXAM: ABDOMEN - 1 VIEW COMPARISON:  CT Abdomen and Pelvis 06/22/2020. Abdominal radiographs 06/25/2020 and earlier. FINDINGS: Supine view at 0749 hours. Paucity of bowel gas now, decreased from yesterday. No dilated loops are evident. Stable cholecystectomy clips. Stable visualized osseous structures. IMPRESSION: Decreased bowel gas since yesterday. No evidence of bowel obstruction. Electronically Signed   By: Odessa Fleming M.D.   On: 06/26/2020 08:05   DG Abd 1 View  Result Date: 06/25/2020 CLINICAL DATA:  87 year old female with nausea and emesis EXAM: ABDOMEN - 1 VIEW COMPARISON:  Prior abdominal radiograph 01/25/2020 FINDINGS: Nasogastric tube no longer visualized. The bowel gas pattern is normal. Surgical clips in the right upper quadrant consistent with prior cholecystectomy. Solitary surgical clip in the right lower quadrant suggest possible prior appendectomy. Moderate stool burden overlies the rectum. Multilevel degenerative disc disease. No lytic or blastic osseous lesion. IMPRESSION:  Nasogastric tube no longer visualized. It may have been removed, or migrated up into the esophagus. Nonobstructed bowel gas pattern. Moderate volume of formed stool overlying the rectum. Electronically Signed   By: Malachy Moan M.D.   On: 06/25/2020 07:49   DG Abd 1 View  Result Date: 06/24/2020 CLINICAL DATA:  Nausea and vomiting 1 day. EXAM: ABDOMEN - 1 VIEW COMPARISON:  06/22/2020 FINDINGS: Nasogastric tube has tip over the stomach in the left upper quadrant. Bowel gas pattern is nonobstructive. No free peritoneal air. Single surgical clip over the right lower abdomen. Surgical clips over the right upper quadrant. There are degenerative changes of the spine. Degenerative change of the hips right worse than left. IMPRESSION: 1. Nonobstructive bowel gas pattern. 2. Nasogastric tube with tip over the stomach in the left upper quadrant. Electronically Signed   By: Elberta Fortis M.D.   On: 06/24/2020 08:04   DG Abd Acute W/Chest  Result Date: 06/22/2020 CLINICAL DATA:  Vomiting today. EXAM: DG ABDOMEN ACUTE WITH 1 VIEW CHEST COMPARISON:  CT abdomen pelvis dated May 09, 2020. Chest x-ray dated February 08, 2019. FINDINGS: There is no evidence of dilated bowel loops or free intraperitoneal air. Paucity of small bowel gas. Air and stool are seen in the colon. Unchanged paraesophageal hiatal hernia. No radiopaque calculi or other significant radiographic abnormality is seen. Normal heart size. New patchy opacity in the peripheral left mid lung. Possible small left pleural effusion. The right lung is clear. No pneumothorax. No acute osseous abnormality. IMPRESSION: 1. New patchy opacity in the peripheral left mid lung, suspicious for pneumonia. 2. Possible small left pleural effusion. 3. Unchanged paraesophageal hiatal hernia.  No bowel obstruction. Electronically Signed   By: Obie Dredge M.D.   On: 06/22/2020 19:26

## 2020-06-26 NOTE — Progress Notes (Signed)
Physical Therapy Treatment Patient Details Name: Natalie Spencer MRN: [id number] DOB: [birth year] Today's Date: 06/26/2020    History of Present Illness Pt is a 87 y/o female admitted secondary to nausea and vomitting, found to have mesenteric axial volvulus of the stomach now s/p NGT insertion. PMH including but not limited to advanced dementia, HTN and CVA.    PT Comments    Patient received in bed, very confused and oriented to self only. Needed totalAx2 for pretty much all aspects of mobility today, as well as MaxAx1 to maintain balance at EOB due to multidirectional balance loss. Attempted transfers in stedy but unable to perform safely, no initiation or cue following noted from patient. Left in bed positioned to comfort with all needs met, bed alarm active and RN aware of patient status.     Follow Up Recommendations  SNF     Equipment Recommendations  None recommended by PT    Recommendations for Other Services       Precautions / Restrictions Precautions Precautions: Fall Restrictions Weight Bearing Restrictions: No    Mobility  Bed Mobility Overal bed mobility: Needs Assistance Bed Mobility: Supine to Sit;Sit to Supine     Supine to sit: +2 for physical assistance;Total assist Sit to supine: Total assist;+2 for physical assistance   General bed mobility comments: totalAx2 for all aspects of bed mobility todaya    Transfers                 General transfer comment: attempted transfers in stedy, unable to complete even with totalAx2  Ambulation/Gait             General Gait Details: unable   Stairs             Wheelchair Mobility    Modified Rankin (Stroke Patients Only)       Balance Overall balance assessment: Needs assistance   Sitting balance-Leahy Scale: Poor Sitting balance - Comments: maxA to maintain balance at EOB, multidirectional balance loss                                    Cognition Arousal/Alertness:  Awake/alert Behavior During Therapy: Restless Overall Cognitive Status: History of cognitive impairments - at baseline                                 General Comments: has dementia at baseline, perseverated on telling us how wonderful we are and pulling on O2 line but did not follow cues well- perhaps 20% of the time at best. Question if she is at cognitive baseline.      Exercises      General Comments        Pertinent Vitals/Pain Pain Assessment: Faces Faces Pain Scale: No hurt Pain Intervention(s): Limited activity within patient's tolerance;Monitored during session    Home Living                      Prior Function            PT Goals (current goals can now be found in the care plan section) Acute Rehab PT Goals Patient Stated Goal: unable to state PT Goal Formulation: Patient unable to participate in goal setting Time For Goal Achievement: 07/08/20 Potential to Achieve Goals: Fair Progress towards PT goals: Not progressing toward goals - comment (limited by cognition today)  Frequency    Min 2X/week      PT Plan Current plan remains appropriate    Co-evaluation              AM-PAC PT "6 Clicks" Mobility   Outcome Measure  Help needed turning from your back to your side while in a flat bed without using bedrails?: A Lot Help needed moving from lying on your back to sitting on the side of a flat bed without using bedrails?: Total Help needed moving to and from a bed to a chair (including a wheelchair)?: Total Help needed standing up from a chair using your arms (e.g., wheelchair or bedside chair)?: Total Help needed to walk in hospital room?: Total Help needed climbing 3-5 steps with a railing? : Total 6 Click Score: 7    End of Session   Activity Tolerance: Patient tolerated treatment well Patient left: in bed;with call bell/phone within reach;with bed alarm set;with nursing/sitter in room;with restraints reapplied Nurse  Communication: Mobility status PT Visit Diagnosis: Muscle weakness (generalized) (M62.81)     Time: 1050-1107 PT Time Calculation (min) (ACUTE ONLY): 17 min  Charges:  $Therapeutic Activity: 8-22 mins                    Windell Norfolk, DPT, PN1   Supplemental Physical Therapist [place]    Pager [phone] Acute Rehab Office [phone]

## 2020-06-26 NOTE — Progress Notes (Signed)
Pharmacy Antibiotic Note  Natalie Spencer is a 87 y.o. female admitted on 06/22/2020 with aspiration pneumonia.  Pharmacy has been consulted for Unasyn dosing.  Plan: Unasyn 1.5 gm IV q6hr Monitor renal function, clinical status and C&S   Temp (24hrs), Avg:98.3 F (36.8 C), Min:97.6 F (36.4 C), Max:99.3 F (37.4 C)  Recent Labs  Lab 06/22/20 1506 06/23/20 0423 06/24/20 0138 06/25/20 0107 06/26/20 0122  WBC 13.8* 11.2* 9.5 9.7 10.8*  CREATININE 1.03* 1.03* 0.95 0.92 0.80    CrCl cannot be calculated (Unknown ideal weight.).    No Known Allergies  Antimicrobials this admission: Unasyn 6/14 >>   Thank you for allowing pharmacy to be a part of this patient's care.  Jeanella Cara, PharmD, [facility] Clinical Pharmacist Please see AMION for all Pharmacists' Contact Phone Numbers 06/26/2020, 9:26 AM

## 2020-06-27 LAB — CBC WITH DIFFERENTIAL/PLATELET
Abs Immature Granulocytes: 0.1 10*3/uL — ABNORMAL HIGH (ref 0.00–0.07)
Basophils Absolute: 0 10*3/uL (ref 0.0–0.1)
Basophils Relative: 0 %
Eosinophils Absolute: 0.4 10*3/uL (ref 0.0–0.5)
Eosinophils Relative: 4 %
HCT: 38.6 % (ref 36.0–46.0)
Hemoglobin: 12.2 g/dL (ref 12.0–15.0)
Immature Granulocytes: 1 %
Lymphocytes Relative: 27 %
Lymphs Abs: 2.6 10*3/uL (ref 0.7–4.0)
MCH: 27.9 pg (ref 26.0–34.0)
MCHC: 31.6 g/dL (ref 30.0–36.0)
MCV: 88.1 fL (ref 80.0–100.0)
Monocytes Absolute: 0.8 10*3/uL (ref 0.1–1.0)
Monocytes Relative: 8 %
Neutro Abs: 5.6 10*3/uL (ref 1.7–7.7)
Neutrophils Relative %: 60 %
Platelets: 267 10*3/uL (ref 150–400)
RBC: 4.38 MIL/uL (ref 3.87–5.11)
RDW: 13.2 % (ref 11.5–15.5)
WBC: 9.5 10*3/uL (ref 4.0–10.5)
nRBC: 0 % (ref 0.0–0.2)

## 2020-06-27 LAB — COMPREHENSIVE METABOLIC PANEL
ALT: 19 U/L (ref 0–44)
AST: 28 U/L (ref 15–41)
Albumin: 2.2 g/dL — ABNORMAL LOW (ref 3.5–5.0)
Alkaline Phosphatase: 50 U/L (ref 38–126)
Anion gap: 6 (ref 5–15)
BUN: 10 mg/dL (ref 8–23)
CO2: 27 mmol/L (ref 22–32)
Calcium: 9 mg/dL (ref 8.9–10.3)
Chloride: 103 mmol/L (ref 98–111)
Creatinine, Ser: 0.75 mg/dL (ref 0.44–1.00)
GFR, Estimated: >60 mL/min (ref >60)
Glucose, Bld: 116 mg/dL — ABNORMAL HIGH (ref 70–99)
Potassium: 3.9 mmol/L (ref 3.5–5.1)
Sodium: 136 mmol/L (ref 135–145)
Total Bilirubin: 0.9 mg/dL (ref 0.3–1.2)
Total Protein: 5.5 g/dL — ABNORMAL LOW (ref 6.5–8.1)

## 2020-06-27 LAB — GLUCOSE, CAPILLARY
Glucose-Capillary: 103 mg/dL — ABNORMAL HIGH (ref 70–99)
Glucose-Capillary: 115 mg/dL — ABNORMAL HIGH (ref 70–99)
Glucose-Capillary: 129 mg/dL — ABNORMAL HIGH (ref 70–99)
Glucose-Capillary: 94 mg/dL (ref 70–99)

## 2020-06-27 LAB — MAGNESIUM: Magnesium: 1.5 mg/dL — ABNORMAL LOW (ref 1.7–2.4)

## 2020-06-27 LAB — BRAIN NATRIURETIC PEPTIDE: B Natriuretic Peptide: 340.8 pg/mL — ABNORMAL HIGH (ref 0.0–100.0)

## 2020-06-27 LAB — SARS CORONAVIRUS 2 (TAT 6-24 HRS): SARS Coronavirus 2: NEGATIVE

## 2020-06-27 MED ORDER — AMLODIPINE BESYLATE 10 MG PO TABS: 10.0000 mg | ORAL_TABLET | Freq: Every day | ORAL | 11 refills | Status: AC

## 2020-06-27 MED ORDER — LORAZEPAM 0.5 MG PO TABS: 0.2500 mg | ORAL_TABLET | Freq: Four times a day (QID) | ORAL | 0 refills | Status: AC | PRN

## 2020-06-27 MED ORDER — MAGNESIUM SULFATE 2 GM/50ML IV SOLN
2.0000 g | Freq: Once | INTRAVENOUS | Status: AC
  Administered 2020-06-27: 2 g via INTRAVENOUS
  Filled 2020-06-27: qty 50

## 2020-06-27 MED ORDER — CLONIDINE HCL 0.2 MG PO TABS: 0.2000 mg | ORAL_TABLET | Freq: Three times a day (TID) | ORAL | Status: AC

## 2020-06-27 MED ORDER — TRAMADOL HCL 50 MG PO TABS: 50.0000 mg | ORAL_TABLET | Freq: Four times a day (QID) | ORAL | 0 refills | Status: AC | PRN

## 2020-06-27 MED ORDER — CLONIDINE HCL 0.2 MG PO TABS
0.2000 mg | ORAL_TABLET | Freq: Three times a day (TID) | ORAL | Status: DC
  Administered 2020-06-27 (×2): 0.2 mg via ORAL
  Filled 2020-06-27 (×2): qty 1

## 2020-06-27 NOTE — Progress Notes (Signed)
Report given to RN at [facility] [place].

## 2020-06-27 NOTE — Progress Notes (Signed)
Patient discharge via PTAR to facility. No distress noted at this time. IV site removed, intact.

## 2020-06-27 NOTE — Discharge Summary (Signed)
Natalie Spencer ZOX:[id number] DOB: 04/06/1926 DOA: 06/22/2020  PCP: Patient, No Pcp Per (Inactive)  Admit date: 06/22/2020  Discharge date: 06/27/2020  Admitted From: SNF  Disposition:  SNF with Hospice   Recommendations for Outpatient Follow-up:   Follow up with PCP in 1-2 weeks  PCP Please obtain BMP/CBC, 2 view CXR in 1week,  (see Discharge instructions)   PCP Please follow up on the following pending results:    Home Health: None   Equipment/Devices: None  Consultations: None GI Discharge Condition: Stable    CODE STATUS: Full    Diet Recommendation: Soft   Chief Complaint  Patient presents with   Emesis     Brief history of present illness from the day of admission and additional interim summary    Natalie Spencer is a 87 y.o. female with history of advanced dementia was brought to the ER after patient had nausea vomiting.  Its not sure how long patient has been having vomiting.  Patient was admitted about 2 months ago for similar situation at that time EGD showed esophagitis.  Work-up in the ER at this time showed that patient had gastric volvulus with obstruction, GI general surgery were consulted and she was admitted for further treatment.                                                                 Hospital Course     Nausea vomiting with CT scan showing mesenteric axial volvulus of the stomach with possible obstruction - she remains n.p.o., GI and general surgery on board, underwent EGD with NG tube placement with EGD on 06/23/2020, scope could not be advanced much beyond the esophagus, general surgery deems patient to be a poor candidate which I agree with, for now abdomen appears to be soft, thankfully with conservative treatment her volvulus and obstruction seems to have resolved, she is tolerating clear  liquids, abdomen is soft and she is symptom-free, will be gradually advance to soft diet and discharged to memory care unit with hospice.  Case discussed with POA Ms. [name] in detail on a daily basis plan is once she gets better to return to memory care unit with DNR and hospice follow-up.  No further heroics. Possible pneumonia - treated with antibiotics finished course. Hypokalemia likely from vomiting and NG suction.  Replaced we will continue to monitor. History of hypertension - she has been placed on Catapres and Norvasc combination. History of dementia.  Supportive care, now hospice at SNF. Acute renal failure from vomiting - resolved after IV fluids and hydration Possible GI bleed per we will check serial CBCs.  On IV Protonix. Dehydration with hypernatremia and hypokalemia.  Resolved after D5W stop further IV fluids.  Discharge diagnosis     Principal Problem:   Nausea & vomiting Active Problems:  Dementia (HCC)   Hypertension   CAP (community acquired pneumonia)   ARF (acute renal failure) (HCC)   Gastric volvulus   Abnormal CT scan, stomach    Discharge instructions    Discharge Instructions     Discharge instructions   Complete by: As directed    Disposition.  Residential hospice Condition.  Guarded CODE STATUS.  DNR Activity.  With assistance as tolerated, full fall precautions. Diet.  Soft with feeding assistance and aspiration precautions. Goal of care.  Comfort.       Discharge Medications   Allergies as of 06/27/2020   No Known Allergies      Medication List     TAKE these medications    amLODipine 10 MG tablet Commonly known as: NORVASC Take 1 tablet (10 mg total) by mouth daily. What changed:  medication strength how much to take   cloNIDine 0.2 MG tablet Commonly known as: CATAPRES Take 1 tablet (0.2 mg total) by mouth 3 (three) times daily.   LORazepam 0.5 MG tablet Commonly known as: ATIVAN Take 0.5 tablets (0.25 mg total) by  mouth every 6 (six) hours as needed for anxiety (or agitation).   melatonin 5 MG Tabs Take 5 mg by mouth at bedtime.   ondansetron 4 MG disintegrating tablet Commonly known as: Zofran ODT Take 1 tablet (4 mg total) by mouth every 8 (eight) hours as needed for nausea or vomiting.   pantoprazole 40 MG tablet Commonly known as: PROTONIX Take 1 tablet (40 mg total) by mouth 2 (two) times daily.   traMADol 50 MG tablet Commonly known as: ULTRAM Take 1 tablet (50 mg total) by mouth every 6 (six) hours as needed (for pain).          Major procedures and Radiology Reports - PLEASE review detailed and final reports thoroughly  -        CT Abdomen Pelvis Wo Contrast  Result Date: 06/22/2020 CLINICAL DATA:  Abdominal pain and vomiting. EXAM: CT ABDOMEN AND PELVIS WITHOUT CONTRAST TECHNIQUE: Multidetector CT imaging of the abdomen and pelvis was performed following the standard protocol without IV contrast. COMPARISON:  CT abdomen pelvis dated May 09, 2020. FINDINGS: Lower chest: New consolidation in the left lower lobe, incompletely visualized. Hepatobiliary: No focal liver abnormality is seen. Status post cholecystectomy. No biliary dilatation. Pancreas: Atrophic. No ductal dilatation or surrounding inflammatory changes. Spleen: Normal in size without focal abnormality. Adrenals/Urinary Tract: Adrenal glands are unremarkable. No renal calculi or hydronephrosis. The bladder is unremarkable. 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 The stomach and lower esophagus are distended with fluid. No wall thickening or pneumatosis. No small bowel obstruction. Extensive sigmoid colonic diverticulosis again noted. Normal appendix. Vascular/Lymphatic: Aortic atherosclerosis. No enlarged abdominal or pelvic lymph nodes. Reproductive: Uterus and bilateral adnexa are unremarkable. Other: Unchanged tiny fat containing umbilical hernia.  No free fluid or pneumoperitoneum. Musculoskeletal: No acute or significant osseous findings. IMPRESSION: 1. 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 The stomach and lower esophagus are distended with fluid, suggestive of low-grade obstruction. This is unchanged in appearance from the prior scan. 2. New consolidation in the left lower lobe, incompletely visualized, concerning for pneumonia. 3. Aortic Atherosclerosis (ICD10-I70.0). Electronically Signed   By: Obie Dredge M.D.   On: 06/22/2020 21:15   DG Abd 1 View  Result Date: 06/26/2020 CLINICAL DATA:  87 year old female with nausea and vomiting. EXAM: ABDOMEN - 1 VIEW COMPARISON:  CT  Abdomen and Pelvis 06/22/2020. Abdominal radiographs 06/25/2020 and earlier. FINDINGS: Supine view at 0749 hours. Paucity of bowel gas now, decreased from yesterday. No dilated loops are evident. Stable cholecystectomy clips. Stable visualized osseous structures. IMPRESSION: Decreased bowel gas since yesterday. No evidence of bowel obstruction. Electronically Signed   By: Odessa Fleming M.D.   On: 06/26/2020 08:05   DG Abd 1 View  Result Date: 06/25/2020 CLINICAL DATA:  87 year old female with nausea and emesis EXAM: ABDOMEN - 1 VIEW COMPARISON:  Prior abdominal radiograph 01/25/2020 FINDINGS: Nasogastric tube no longer visualized. The bowel gas pattern is normal. Surgical clips in the right upper quadrant consistent with prior cholecystectomy. Solitary surgical clip in the right lower quadrant suggest possible prior appendectomy. Moderate stool burden overlies the rectum. Multilevel degenerative disc disease. No lytic or blastic osseous lesion. IMPRESSION: Nasogastric tube no longer visualized. It may have been removed, or migrated up into the esophagus. Nonobstructed bowel gas pattern. Moderate volume of formed stool overlying the rectum. Electronically Signed   By: Malachy Moan M.D.   On: 06/25/2020 07:49    DG Abd 1 View  Result Date: 06/24/2020 CLINICAL DATA:  Nausea and vomiting 1 day. EXAM: ABDOMEN - 1 VIEW COMPARISON:  06/22/2020 FINDINGS: Nasogastric tube has tip over the stomach in the left upper quadrant. Bowel gas pattern is nonobstructive. No free peritoneal air. Single surgical clip over the right lower abdomen. Surgical clips over the right upper quadrant. There are degenerative changes of the spine. Degenerative change of the hips right worse than left. IMPRESSION: 1. Nonobstructive bowel gas pattern. 2. Nasogastric tube with tip over the stomach in the left upper quadrant. Electronically Signed   By: Elberta Fortis M.D.   On: 06/24/2020 08:04   DG Abd Acute W/Chest  Result Date: 06/22/2020 CLINICAL DATA:  Vomiting today. EXAM: DG ABDOMEN ACUTE WITH 1 VIEW CHEST COMPARISON:  CT abdomen pelvis dated May 09, 2020. Chest x-ray dated February 08, 2019. FINDINGS: There is no evidence of dilated bowel loops or free intraperitoneal air. Paucity of small bowel gas. Air and stool are seen in the colon. Unchanged paraesophageal hiatal hernia. No radiopaque calculi or other significant radiographic abnormality is seen. Normal heart size. New patchy opacity in the peripheral left mid lung. Possible small left pleural effusion. The right lung is clear. No pneumothorax. No acute osseous abnormality. IMPRESSION: 1. New patchy opacity in the peripheral left mid lung, suspicious for pneumonia. 2. Possible small left pleural effusion. 3. Unchanged paraesophageal hiatal hernia.  No bowel obstruction. Electronically Signed   By: Obie Dredge M.D.   On: 06/22/2020 19:26    Micro Results     Recent Results (from the past 240 hour(s))  Resp Panel by RT-PCR (Flu A&B, Covid) Nasopharyngeal Swab     Status: None   Collection Time: 06/23/20 12:30 AM   Specimen: Nasopharyngeal Swab; Nasopharyngeal(NP) swabs in vial transport medium  Result Value Ref Range Status   SARS Coronavirus 2 by RT PCR NEGATIVE NEGATIVE  Final    Comment: (NOTE) SARS-CoV-2 target nucleic acids are NOT DETECTED.  The SARS-CoV-2 RNA is generally detectable in upper respiratory specimens during the acute phase of infection. The lowest concentration of SARS-CoV-2 viral copies this assay can detect is 138 copies/mL. A negative result does not preclude SARS-Cov-2 infection and should not be used as the sole basis for treatment or other patient management decisions. A negative result may occur with  improper specimen collection/handling, submission of specimen other than nasopharyngeal swab, presence of  viral mutation(s) within the areas targeted by this assay, and inadequate number of viral copies(<138 copies/mL). A negative result must be combined with clinical observations, patient history, and epidemiological information. The expected result is Negative.  Fact Sheet for Patients:  BloggerCourse.comhttps://www.fda.gov/media/152166/download  Fact Sheet for Healthcare Providers:  SeriousBroker.ithttps://www.fda.gov/media/152162/download  This test is no t yet approved or cleared by the [place] FDA and  has been authorized for detection and/or diagnosis of SARS-CoV-2 by FDA under an Emergency Use Authorization (EUA). This EUA will remain  in effect (meaning this test can be used) for the duration of the COVID-19 declaration under Section 564(b)(1) of the Act, 21 U.S.C.section 360bbb-3(b)(1), unless the authorization is terminated  or revoked sooner.       Influenza A by PCR NEGATIVE NEGATIVE Final   Influenza B by PCR NEGATIVE NEGATIVE Final    Comment: (NOTE) The Xpert Xpress SARS-CoV-2/FLU/RSV plus assay is intended as an aid in the diagnosis of influenza from Nasopharyngeal swab specimens and should not be used as a sole basis for treatment. Nasal washings and aspirates are unacceptable for Xpert Xpress SARS-CoV-2/FLU/RSV testing.  Fact Sheet for Patients: BloggerCourse.comhttps://www.fda.gov/media/152166/download  Fact Sheet for Healthcare  Providers: SeriousBroker.ithttps://www.fda.gov/media/152162/download  This test is not yet approved or cleared by the [place] FDA and has been authorized for detection and/or diagnosis of SARS-CoV-2 by FDA under an Emergency Use Authorization (EUA). This EUA will remain in effect (meaning this test can be used) for the duration of the COVID-19 declaration under Section 564(b)(1) of the Act, 21 U.S.C. section 360bbb-3(b)(1), unless the authorization is terminated or revoked.  Performed at [facility], [address] [place], [place] [address]     Today   Subjective    [facility] today has no headache,no chest abdominal pain,no new weakness tingling or numbness, feels much better wants to go home today.     Objective   Blood pressure 145/80, pulse 86, temperature 98.1 F (36.7 C), temperature source Axillary, resp. rate 20, SpO2 96 %.   Intake/Output Summary (Last 24 hours) at 06/27/2020 0952 Last data filed at 06/26/2020 1814 Gross per 24 hour  Intake 120 ml  Output 200 ml  Net -80 ml    Exam  Awake - comfortable but pleasantly confused, No new F.N deficits,   [place].AT,PERRAL Supple Neck,No JVD, No cervical lymphadenopathy appriciated.  Symmetrical Chest wall movement, Good air movement bilaterally, CTAB RRR,No Gallops,Rubs or new Murmurs, No Parasternal Heave +ve B.Sounds, Abd Soft, Non tender, No organomegaly appriciated, No rebound -guarding or rigidity. No Cyanosis, Clubbing or edema, No new Rash or bruise   Data Review   CBC w Diff:  Lab Results  Component Value Date   WBC 9.5 06/27/2020   HGB 12.2 06/27/2020   HCT 38.6 06/27/2020   PLT 267 06/27/2020   LYMPHOPCT 27 06/27/2020   MONOPCT 8 06/27/2020   EOSPCT 4 06/27/2020   BASOPCT 0 06/27/2020    CMP:  Lab Results  Component Value Date   NA 136 06/27/2020   K 3.9 06/27/2020   CL 103 06/27/2020   CO2 27 06/27/2020   BUN 10 06/27/2020   CREATININE 0.75 06/27/2020   PROT 5.5 (L) 06/27/2020   ALBUMIN  2.2 (L) 06/27/2020   BILITOT 0.9 06/27/2020   ALKPHOS 50 06/27/2020   AST 28 06/27/2020   ALT 19 06/27/2020  .   Total Time in preparing paper work, data evaluation and todays exam - 35 minutes  Susa RaringPrashant Dagan Heinz M.D on 06/27/2020 at  9:52 AM  [facility]

## 2020-06-27 NOTE — Plan of Care (Signed)

## 2020-06-27 NOTE — TOC Transition Note (Signed)
Transition of Care [facility]) - CM/SW Discharge Note   Patient Details  Name: Natalie Spencer MRN: [id number] Date of Birth: 18-Jul-1926  [facility] of [facility]) CM/SW Contact:  Mearl Latin, LCSW Phone Number: 06/27/2020, 3:29 PM   Clinical Narrative:    Patient will DC to: [facility] ALF/memory care Anticipated DC date: 06/27/20 Family notified: Guardian, Norina Buzzard Transport by: Sharin Mons   Per MD patient ready for DC to [facility]. RN to call report prior to discharge [phone]). RN, patient, patient's family, and facility notified of DC. Discharge Summary and FL2 sent to facility (f. [phone]). Per Donette, facility ready for patient and will contact [facility] upon her arrival. DC packet on chart. Ambulance transport requested for patient.   CSW will sign off for now as social work intervention is no longer needed. Please consult [place] again if new needs arise.     Final next level of care: Skilled Nursing Facility Barriers to Discharge: Barriers Resolved   Patient Goals and CMS Choice Patient states their goals for this hospitalization and ongoing recovery are:: comfort CMS Medicare.gov Compare Post Acute Care list provided to:: Legal Guardian Choice offered to / list presented to : [facility] POA / Guardian ([name])  Discharge Placement   Existing PASRR number confirmed : 06/27/20          Patient chooses bed at: [facility] Patient to be transferred to facility by: PTAR Name of family member notified: [name] Patient and family notified of of transfer: 06/27/20  Discharge Plan and Services In-house Referral: Clinical Social Work   Post Acute Care Choice: Skilled Nursing Facility, Hospice                               Social Determinants of Health (SDOH) Interventions     Readmission Risk Interventions No flowsheet data found.

## 2020-06-27 NOTE — Discharge Instructions (Signed)
Disposition.  Residential hospice °Condition.  Guarded °CODE STATUS.  DNR °Activity.  With assistance as tolerated, full fall precautions. °Diet.  Soft with feeding assistance and aspiration precautions. °Goal of care.  Comfort. ° °

## 2020-06-27 NOTE — [place] FL2 (Signed)
MEDICAID FL2 LEVEL OF CARE SCREENING TOOL     IDENTIFICATION  Patient Name: Natalie Spencer Birthdate: [birth year] Sex: female Admission Date (Current Location): 06/22/2020  [facility] and [place] Number:  Producer, television/film/video and Address:  [facility], [address], [place], [place] [address]      Provider Number: [id number]  Attending Physician Name and Address:  Leroy Sea, MD  Relative Name and Phone Number:  Eldred Manges, [phone]    Current Level of Care: Hospital Recommended Level of Care: Memory Care Prior Approval Number:    Date Approved/Denied:   PASRR Number:    Discharge Plan: Other (Comment) (Memory Care)    Current Diagnoses: Patient Active Problem List   Diagnosis Date Noted   Dementia (HCC) 02/08/2018   Gastric volvulus    Abnormal CT scan, stomach    CAP (community acquired pneumonia) 06/22/2020   ARF (acute renal failure) (HCC) 06/22/2020   Hematemesis 05/10/2020   Gastroesophageal reflux disease with esophagitis without hemorrhage    Paraesophageal hernia    Acute upper GI bleed 05/09/2020   Nausea & vomiting 05/09/2020   Lactic acidosis 05/09/2020   Hypertension    Leukocytosis    Hypokalemia    TBI (traumatic brain injury) (HCC) 02/08/2018   Injury of globe of left eye 02/08/2018   Contusion of right middle finger 02/08/2018   Fall 02/08/2018   Anemia 02/08/2018   Bilateral lower extremity edema 02/08/2018    Orientation RESPIRATION BLADDER Height & Weight     Self  O2 (Nasal cannula 5L) Incontinent, External catheter Weight:   Height:     BEHAVIORAL SYMPTOMS/MOOD NEUROLOGICAL BOWEL NUTRITION STATUS      Incontinent Diet: Soft  AMBULATORY STATUS COMMUNICATION OF NEEDS Skin   Extensive Assist Verbally                         Personal Care Assistance Level of Assistance  Bathing, Feeding, Dressing Bathing Assistance: Maximum assistance Feeding assistance: Maximum  assistance Dressing Assistance: Maximum assistance     Functional Limitations Info             SPECIAL CARE FACTORS FREQUENCY   (Add Hospice)                    Contractures Contractures Info: Not present    Additional Factors Info  Code Status, Allergies, Psychotropic Code Status Info: DNR Allergies Info: NKA Psychotropic Info: Clonidine         Current Medications (06/27/2020):   Discharge Medications: TAKE these medications     amLODipine 10 MG tablet Commonly known as: NORVASC Take 1 tablet (10 mg total) by mouth daily. What changed: medication strength how much to take    cloNIDine 0.2 MG tablet Commonly known as: CATAPRES Take 1 tablet (0.2 mg total) by mouth 3 (three) times daily.    LORazepam 0.5 MG tablet Commonly known as: ATIVAN Take 0.5 tablets (0.25 mg total) by mouth every 6 (six) hours as needed for anxiety (or agitation).    melatonin 5 MG Tabs Take 5 mg by mouth at bedtime.    ondansetron 4 MG disintegrating tablet Commonly known as: Zofran ODT Take 1 tablet (4 mg total) by mouth every 8 (eight) hours as needed for nausea or vomiting.    pantoprazole 40 MG tablet Commonly known as: PROTONIX Take 1 tablet (40 mg total) by mouth 2 (two) times daily.    traMADol 50  MG tablet Commonly known as: ULTRAM Take 1 tablet (50 mg total) by mouth every 6 (six) hours as needed (for pain).      Relevant Imaging Results:  Relevant Lab Results:   Additional Information SSN: [id number]. Add Hospice services  Mearl Latin, LCSW

## 2020-06-27 NOTE — Progress Notes (Signed)
Nutrition Follow-up  DOCUMENTATION CODES:   Not applicable  INTERVENTION:   -RD will follow for diet advancement and adjust supplement regimen as appropriate  NUTRITION DIAGNOSIS:   Inadequate oral intake related to altered GI function as evidenced by NPO status.  Ongoing  GOAL:   Patient will meet greater than or equal to 90% of their needs  Unmet  MONITOR:   PO intake, Supplement acceptance, Diet advancement, Labs, Weight trends, Skin, I & O's  REASON FOR ASSESSMENT:   Low Braden    ASSESSMENT:   Natalie Spencer is a 87 y.o. female with history of advanced dementia was brought to the ER after patient had nausea vomiting.  Its not sure how long patient has been having vomiting.  Patient was admitted about 2 months ago for similar situation at that time EGD showed esophagitis.  Work-up in the ER at this time showed that patient had gastric volvulus with obstruction, GI general surgery were consulted and she was admitted for further treatment.  6/12- NGT placed 6/13- NGT pulled out by pt, refusing replacement; advanced to clear liquid diet  Reviewed I/O's: -80 ml x 24 hours and +4.1 L since admission  Pt sleeping soundly at time of visit. RD did not disturb.   Pt remains on clear liquid diet. Noted meal completion 0-10%. Per MD notes, plan to slowly advance diet and d/c back to SNF with hospice today.   Labs reviewed: CBGS: 103-129.   Diet Order:   Diet Order             Diet clear liquid Room service appropriate? Yes; Fluid consistency: Thin  Diet effective now                   EDUCATION NEEDS:   Not appropriate for education at this time  Skin:  Skin Assessment: Reviewed RN Assessment  Last BM:  Unknown  Height:   Ht Readings from Last 1 Encounters:  05/08/20 5' (1.524 m)    Weight:   Wt Readings from Last 1 Encounters:  05/08/20 54.4 kg    Ideal Body Weight:  45.5 kg  BMI:  There is no height or weight on file to calculate  BMI.  Estimated Nutritional Needs:   Kcal:  1350-1550  Protein:  65-80 grams  Fluid:  > 1.3 L    Levada Schilling, RD, LDN, CDCES Registered Dietitian II Certified Diabetes Care and Education Specialist Please refer to [facility] for RD and/or RD on-call/weekend/after hours pager

## 2020-08-13 DEATH — deceased

## 2020-10-08 IMAGING — DX DG CHEST 1V PORT
1 series · 1 of 1 positions shown · non-contrast
Comparison: Portable chest 07/03/2018 and earlier.

CLINICAL DATA: [AGE] female with fever, vomiting and altered
mental status. Received 1st AFND2-Q9 vaccine dose yesterday.

EXAM:
PORTABLE CHEST 1 VIEW

[chest ap]
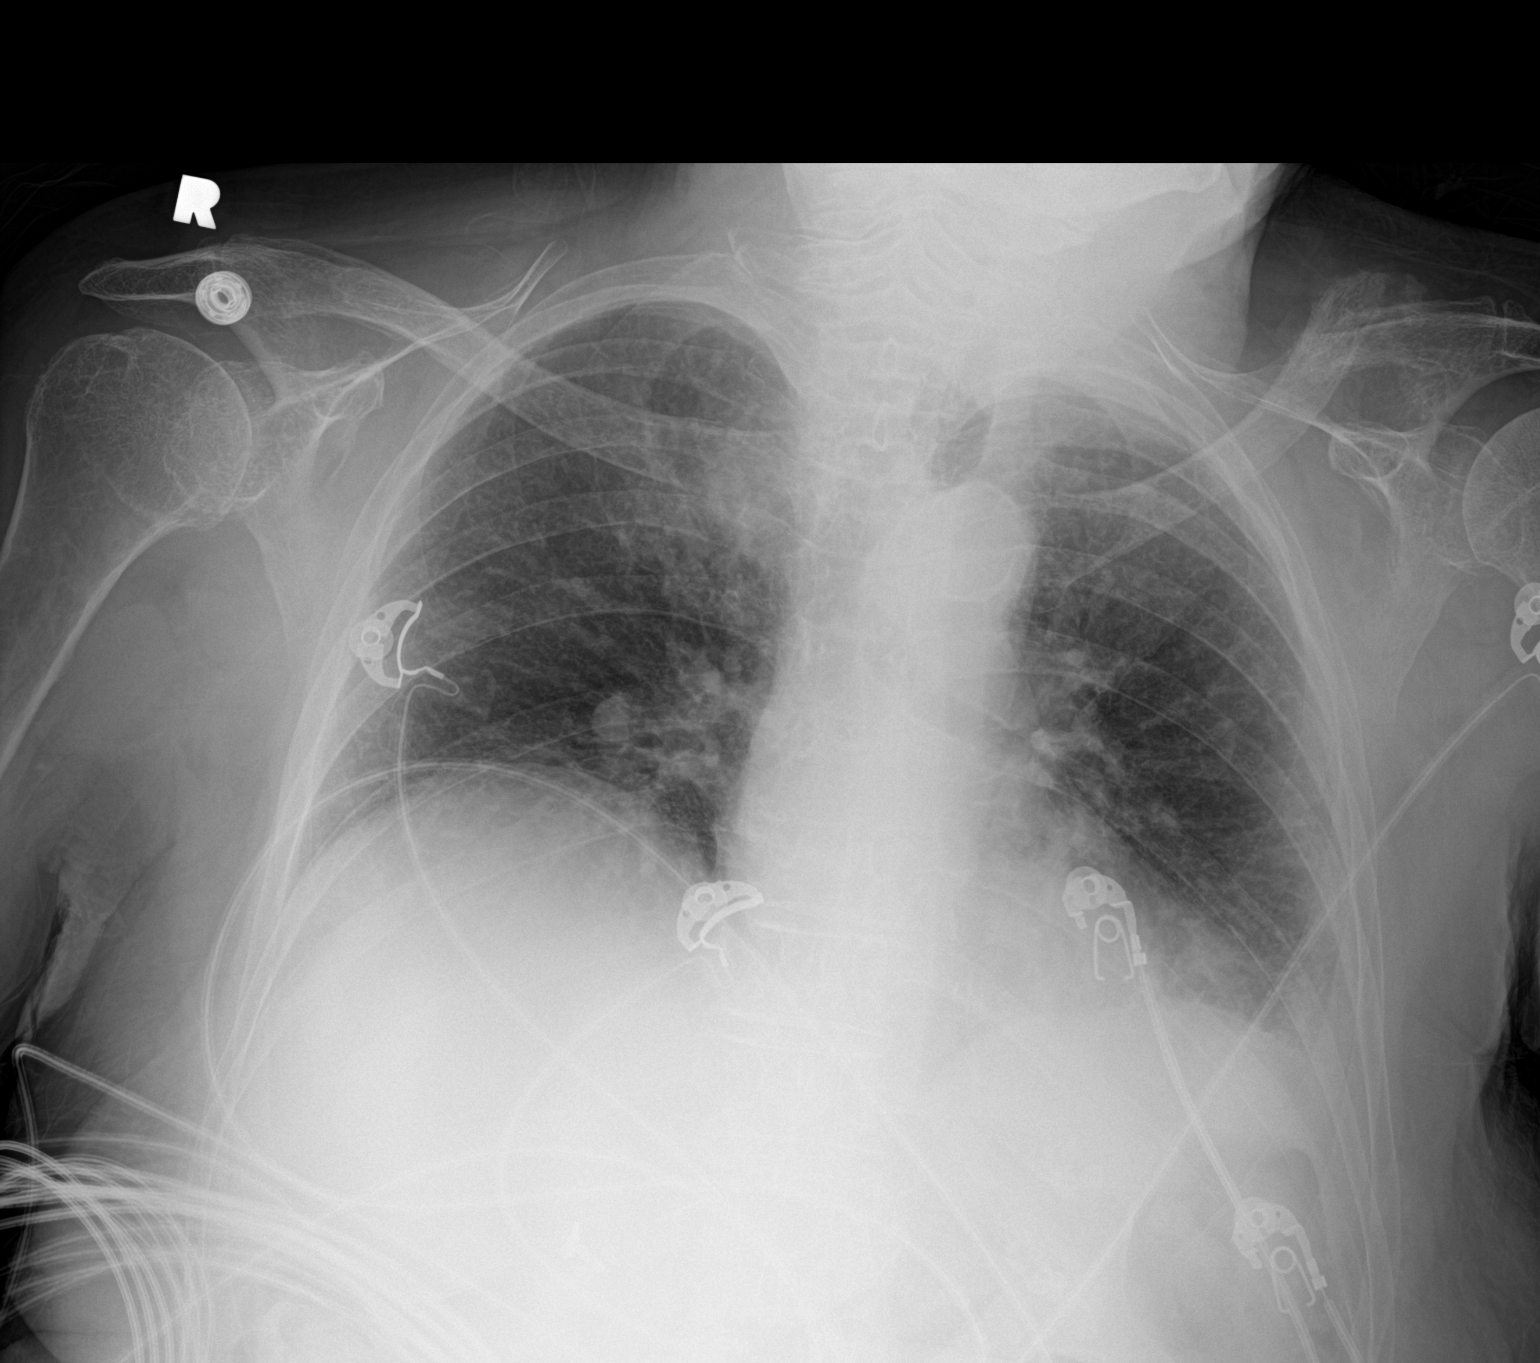

[1 of 1 positions shown; findings below may reference images not displayed]

FINDINGS: Portable AP semi upright view at 7137 hours. Stable low lung
volumes. Mediastinal contours remain normal. Visualized tracheal air
column is within normal limits. Chronic elevation of the right
hemidiaphragm. No pneumothorax, pulmonary edema. No consolidation or
definite pleural effusion. Paucity of bowel gas in the upper
abdomen. Stable cholecystectomy clips. Osteopenia.
IMPRESSION: Chronically low lung volumes with no acute cardiopulmonary
abnormality identified.
